# Patient Record
Sex: Female | Born: 1965
Health system: Southern US, Community
[De-identification: ages and names within clinical notes are randomized; demographics above are authoritative.]

## PROBLEM LIST (undated history)

## (undated) DIAGNOSIS — I82409 Acute embolism and thrombosis of unspecified deep veins of unspecified lower extremity: Secondary | ICD-10-CM

## (undated) DIAGNOSIS — D219 Benign neoplasm of connective and other soft tissue, unspecified: Secondary | ICD-10-CM

## (undated) DIAGNOSIS — I10 Essential (primary) hypertension: Secondary | ICD-10-CM

## (undated) HISTORY — PX: TOE SURGERY: SHX1073

## (undated) HISTORY — DX: Essential (primary) hypertension: I10

## (undated) HISTORY — PX: ANKLE SURGERY: SHX546

---

## 1991-09-13 HISTORY — PX: TUBAL LIGATION: SHX77

## 2019-09-02 ENCOUNTER — Other Ambulatory Visit: Payer: Self-pay

## 2019-09-02 ENCOUNTER — Encounter (HOSPITAL_COMMUNITY): Payer: Self-pay | Admitting: Emergency Medicine

## 2019-09-02 ENCOUNTER — Ambulatory Visit (HOSPITAL_COMMUNITY)
Admission: EM | Admit: 2019-09-02 | Discharge: 2019-09-02 | Disposition: A | Discharge status: Home / Self Care | Payer: Medicaid Other | Attending: Family Medicine | Admitting: Family Medicine

## 2019-09-02 ENCOUNTER — Ambulatory Visit (INDEPENDENT_AMBULATORY_CARE_PROVIDER_SITE_OTHER): Payer: Medicaid Other

## 2019-09-02 DIAGNOSIS — R079 Chest pain, unspecified: Secondary | ICD-10-CM

## 2019-09-02 MED ORDER — PREDNISONE 5 MG PO TABS: ORAL_TABLET | ORAL | 0 refills | Status: DC

## 2019-09-02 NOTE — ED Provider Notes (Signed)
[place]    CSN: [id number] Arrival date & time: 09/02/19  1906      History   Chief Complaint Chief Complaint  Patient presents with  . Back Pain    HPI Carolyn Mcdonald is a 53 y.o. female. she is presenting with a 2 day history of right sided chest pain. Pain has been sharp and constant. No associated diaphoresis or shortness of breath. Feel sharp pain with breathing. No history of similar symptoms. No improvement with modalities to date. Usually has normal blood pressure. Pain is moderate to severe.   HPI  History reviewed. No pertinent past medical history.  There are no problems to display for this patient.   History reviewed. No pertinent surgical history.  OB History   No obstetric history on file.      Home Medications    Prior to Admission medications   Medication Sig Start Date End Date Taking? Authorizing Provider  predniSONE (DELTASONE) 5 MG tablet Take 6 pills for first day, 5 pills second day, 4 pills third day, 3 pills fourth day, 2 pills the fifth day, and 1 pill sixth day. 09/02/19   Rosemarie Ax, MD    Family History History reviewed. No pertinent family history.  Social History Social History   Tobacco Use  . Smoking status: Never Smoker  Substance Use Topics  . Alcohol use: Never  . Drug use: Never     Allergies   Patient has no known allergies.   Review of Systems Review of Systems  Constitutional: Negative for fever.  HENT: Negative for congestion.   Respiratory: Negative for cough.   Cardiovascular: Positive for chest pain.  Gastrointestinal: Negative for abdominal pain.  Musculoskeletal: Positive for back pain.  Skin: Negative for color change.  Neurological: Negative for weakness.  Hematological: Negative for adenopathy.     Physical Exam Triage Vital Signs ED Triage Vitals  Enc Vitals Group     BP 09/02/19 2005 (!) 191/117     Pulse Rate 09/02/19 2005 (!) 105     Resp 09/02/19 2005 18   Temp 09/02/19 2005 97.9 F (36.6 C)     Temp Source 09/02/19 2005 Oral     SpO2 09/02/19 2005 100 %     Weight --      Height --      Head Circumference --      Peak Flow --      Pain Score 09/02/19 2000 8     Pain Loc --      Pain Edu? --      Excl. in [place]? --    No data found.  Updated Vital Signs BP (!) 177/100 (BP Location: Left Arm) Comment: regular cuff on left forearm, dr Rikki Smestad at bedside  Pulse 94   Temp 97.9 F (36.6 C) (Oral)   Resp (!) 24   LMP 08/28/2019   SpO2 99%   Visual Acuity Right Eye Distance:   Left Eye Distance:   Bilateral Distance:    Right Eye Near:   Left Eye Near:    Bilateral Near:     Physical Exam Gen: NAD, alert, cooperative with exam, well-appearing ENT: normal lips, normal nasal mucosa,  Eye: normal EOM, normal conjunctiva and lids CV:  no edema, +2 pedal pulses   Resp: no accessory muscle use, non-labored,  Skin: no rashes, no areas of induration  Neuro: normal tone, normal sensation to touch Psych:  normal insight, alert and oriented MSK: normal gait,normal strength  UC Treatments / Results  Labs (all labs ordered are listed, but only abnormal results are displayed) Labs Reviewed - No data to display  EKG  Ekg interpretation: NSR     Radiology DG Chest 2 View  Result Date: 09/02/2019 CLINICAL DATA:  Right chest and back pain for the past 2 days. No known injury. EXAM: CHEST - 2 VIEW COMPARISON:  None. FINDINGS: Normal sized heart. Tortuous aorta. Clear lungs. Thoracic spine degenerative changes. IMPRESSION: No acute abnormality. Electronically Signed   By: Claudie Revering M.D.   On: 09/02/2019 20:38    Procedures Procedures (including critical care time)  Medications Ordered in UC Medications - No data to display  Initial Impression / Assessment and Plan / UC Course  I have reviewed the triage vital signs and the nursing notes.  Pertinent labs & imaging results that were available during my care of the patient were  reviewed by me and considered in my medical decision making (see chart for details).     [name] is a 53 yo F that is presenting with atypical chest pain. EKG was reassuring. Chest xray not demonstrating source of pain.  Possible for costochondritis versus pleuritic type chest pain.  Seems less likely to be cardiac in origin.  May be associated with stress or anxiety.  No history of cardiac disease or pulmonary disease.  Provided prednisone.  Counseled to seek immediate care if symptoms worsen.  Given indications to follow-up.   Final Clinical Impressions(s) / UC Diagnoses   Final diagnoses:  Chest pain, unspecified type     Discharge Instructions     Please try the prednisone  Please seek immediate care if your symptoms worsen  Please follow up if your symptoms fail to improve.     ED Prescriptions    Medication Sig Dispense Auth. Provider   predniSONE (DELTASONE) 5 MG tablet Take 6 pills for first day, 5 pills second day, 4 pills third day, 3 pills fourth day, 2 pills the fifth day, and 1 pill sixth day. 21 tablet Rosemarie Ax, MD     PDMP not reviewed this encounter.   Rosemarie Ax, MD 09/02/19 2109

## 2019-09-02 NOTE — Discharge Instructions (Addendum)
Please try the prednisone  Please seek immediate care if your symptoms worsen  Please follow up if your symptoms fail to improve.

## 2019-09-02 NOTE — ED Triage Notes (Signed)
Right mid-back pain that started 2 days ago.  Pain to take a deep breath.    Sinus headache a few days ago.

## 2019-09-24 ENCOUNTER — Other Ambulatory Visit: Payer: Self-pay

## 2019-09-24 ENCOUNTER — Ambulatory Visit (HOSPITAL_COMMUNITY)
Admission: EM | Admit: 2019-09-24 | Discharge: 2019-09-24 | Disposition: A | Discharge status: Home / Self Care | Payer: Medicaid Other | Attending: Urgent Care | Admitting: Urgent Care

## 2019-09-24 ENCOUNTER — Encounter (HOSPITAL_COMMUNITY): Payer: Self-pay

## 2019-09-24 ENCOUNTER — Ambulatory Visit (INDEPENDENT_AMBULATORY_CARE_PROVIDER_SITE_OTHER): Payer: Medicaid Other

## 2019-09-24 DIAGNOSIS — I160 Hypertensive urgency: Secondary | ICD-10-CM

## 2019-09-24 DIAGNOSIS — M25562 Pain in left knee: Secondary | ICD-10-CM

## 2019-09-24 DIAGNOSIS — R030 Elevated blood-pressure reading, without diagnosis of hypertension: Secondary | ICD-10-CM

## 2019-09-24 DIAGNOSIS — S8002XA Contusion of left knee, initial encounter: Secondary | ICD-10-CM

## 2019-09-24 DIAGNOSIS — S8012XA Contusion of left lower leg, initial encounter: Secondary | ICD-10-CM

## 2019-09-24 DIAGNOSIS — I10 Essential (primary) hypertension: Secondary | ICD-10-CM

## 2019-09-24 DIAGNOSIS — W19XXXA Unspecified fall, initial encounter: Secondary | ICD-10-CM

## 2019-09-24 MED ORDER — AMLODIPINE BESYLATE 5 MG PO TABS: 5.0000 mg | ORAL_TABLET | Freq: Every day | ORAL | 0 refills | Status: DC

## 2019-09-24 MED ORDER — PREDNISONE 20 MG PO TABS: 20.0000 mg | ORAL_TABLET | Freq: Every day | ORAL | 0 refills | Status: DC

## 2019-09-24 NOTE — ED Provider Notes (Signed)
[place]   MRN: [id number] DOB: [birth year]  Subjective:   Carolyn Mcdonald is a 54 y.o. female presenting for suffering a fall 1 week ago.  Patient accidentally tripped on a step heading into a door.  She landed on her left knee and has since had persistent left knee pain and swelling, bruising as well.  She states that it is difficult for her to continue to walk on her feet.  Regarding her high blood pressure, states that she has not been diagnosed with hypertension before.  She states that she has previously checked her pressure at home and has been normal.  Patient's family history significant for hypertension and heart failure with her mother.  She is not currently taking any medications and has no known food or drug allergies.  Denies past medical and surgical history.  Social History   Tobacco Use  . Smoking status: Never Smoker  . Smokeless tobacco: Never Used  Substance Use Topics  . Alcohol use: Never  . Drug use: Never    Review of Systems  Constitutional: Negative for fever and malaise/fatigue.  HENT: Negative for congestion, ear pain, sinus pain and sore throat.   Eyes: Negative for blurred vision, double vision, discharge and redness.  Respiratory: Negative for cough, hemoptysis, shortness of breath and wheezing.   Cardiovascular: Negative for chest pain.  Gastrointestinal: Negative for abdominal pain, diarrhea, nausea and vomiting.  Genitourinary: Negative for dysuria, flank pain and hematuria.  Musculoskeletal: Positive for joint pain. Negative for myalgias.  Skin: Negative for rash.  Neurological: Negative for dizziness, weakness and headaches.  Psychiatric/Behavioral: Negative for depression and substance abuse.     Objective:   Vitals: BP (!) 194/119 (BP Location: Right Arm)   Pulse 85   Temp 98.5 F (36.9 C) (Oral)   Resp 18   Wt 280 lb (127 kg)   LMP 08/28/2019   SpO2 100%   BP Readings from Last 3 Encounters:  09/24/19 (!) 194/119    09/02/19 (!) 177/100   BP recheck is 196/142 by PA-Zaylin Runco.   Physical Exam Constitutional:      General: She is not in acute distress.    Appearance: Normal appearance. She is well-developed. She is obese. She is not ill-appearing, toxic-appearing or diaphoretic.  HENT:     Head: Normocephalic and atraumatic.     Nose: Nose normal.     Mouth/Throat:     Mouth: Mucous membranes are moist.  Eyes:     General: No scleral icterus.    Extraocular Movements: Extraocular movements intact.     Pupils: Pupils are equal, round, and reactive to light.  Cardiovascular:     Rate and Rhythm: Normal rate and regular rhythm.     Pulses: Normal pulses.     Heart sounds: Normal heart sounds. No murmur. No friction rub. No gallop.   Pulmonary:     Effort: Pulmonary effort is normal. No respiratory distress.     Breath sounds: Normal breath sounds. No stridor. No wheezing, rhonchi or rales.  Musculoskeletal:     Right lower leg: No edema.     Left lower leg: No edema.       Legs:  Skin:    General: Skin is warm and dry.     Findings: No rash.  Neurological:     Mental Status: She is alert and oriented to person, place, and time.     Cranial Nerves: No cranial nerve deficit.     Motor: No weakness.  Coordination: Coordination normal.     Gait: Gait abnormal (limping from left knee pain).     Deep Tendon Reflexes: Reflexes normal.  Psychiatric:        Mood and Affect: Mood normal.        Behavior: Behavior normal.        Thought Content: Thought content normal.        Judgment: Judgment normal.     DG Knee Complete 4 Views Left  Result Date: 09/24/2019 CLINICAL DATA:  54 year old female with fall and left knee pain. EXAM: LEFT KNEE - COMPLETE 4+ VIEW COMPARISON:  None. FINDINGS: There is no acute fracture or dislocation. There is moderate arthritic changes of the left knee with narrowing of the medial compartment and bone spurring. Probable trace suprapatellar effusion. There is mild  diffuse subcutaneous edema. IMPRESSION: 1. No acute fracture or dislocation. 2. Osteoarthritic changes. Electronically Signed   By: Anner Crete M.D.   On: 09/24/2019 20:42    Assessment and Plan :   1. Fall, initial encounter   2. Acute pain of left knee   3. Contusion of left knee and lower leg, initial encounter   4. Hypertensive urgency   5. Essential hypertension   6. Elevated blood pressure reading     Start amlodipine 5mg  once daily. Emphasized need for dietary modifications. Needs to establish care with PCP for further bp management. Strict ER precautions for HTN emergency. Will manage her knee pain for contusion with prednisone given that patient is not a good candidate for NSAIDs. Counseled patient on potential for adverse effects with medications prescribed/recommended today, ER and return-to-clinic precautions discussed, patient verbalized understanding.    Jaynee Eagles, [place] 09/25/19 [phone]

## 2019-09-24 NOTE — ED Notes (Signed)
194/119 reported to [employer]

## 2019-09-24 NOTE — ED Triage Notes (Signed)
Pt states she fell a week ago . Pt states she still has left knee pain.

## 2019-09-24 NOTE — Discharge Instructions (Addendum)
For diabetes, please make sure you are avoiding starchy, carbohydrate foods like pasta, breads, pastry, rice, potatoes, desserts. These foods can elevated your blood sugar. Also, limit your alcohol drinking to 1 per day, avoid sodas, sweet teas. For elevated blood pressure, make sure you are monitoring salt in your diet.  Do not eat restaurant foods and limit processed foods at home.  Processed foods include things like frozen meals preseason meats and dinners.  Make sure your pain attention to sodium labels on foods you by at the grocery store.  For seasoning you can use a brand called Mrs. Dash which includes a lot of salt free seasonings.  Salads - kale, spinach, cabbage, spring mix; use seeds like pumpkin seeds or sunflower seeds, almonds; you can also use 1-2 hard boiled eggs in your salads Fruits - avocadoes, berries (blueberries, raspberries, blackberries), apples, oranges Vegetables - aspargus, cauliflower, broccoli, green beans, brussel spouts, bell peppers; stay away from starchy vegetables like potatoes, carrots, peas  Regarding meat it is better to eat lean meats and limit your red meat consumption including pork.  Wild caught fish, chicken breast are good options.  Do not eat any foods on this list that you are allergic to.

## 2019-11-15 ENCOUNTER — Other Ambulatory Visit: Payer: Self-pay

## 2019-11-15 ENCOUNTER — Encounter (HOSPITAL_COMMUNITY): Payer: Self-pay | Admitting: Emergency Medicine

## 2019-11-15 ENCOUNTER — Emergency Department (HOSPITAL_COMMUNITY)
Admission: EM | Admit: 2019-11-15 | Discharge: 2019-11-15 | Disposition: A | Discharge status: Home / Self Care | Payer: Medicaid Other | Attending: Emergency Medicine | Admitting: Emergency Medicine

## 2019-11-15 DIAGNOSIS — R102 Pelvic and perineal pain: Secondary | ICD-10-CM | POA: Diagnosis not present

## 2019-11-15 DIAGNOSIS — N939 Abnormal uterine and vaginal bleeding, unspecified: Secondary | ICD-10-CM | POA: Insufficient documentation

## 2019-11-15 LAB — CBC
HCT: 39.3 % (ref 36.0–46.0)
Hemoglobin: 12.6 g/dL (ref 12.0–15.0)
MCH: 29 pg (ref 26.0–34.0)
MCHC: 32.1 g/dL (ref 30.0–36.0)
MCV: 90.3 fL (ref 80.0–100.0)
Platelets: 248 10*3/uL (ref 150–400)
RBC: 4.35 MIL/uL (ref 3.87–5.11)
RDW: 12.7 % (ref 11.5–15.5)
WBC: 5.5 10*3/uL (ref 4.0–10.5)
nRBC: 0 % (ref 0.0–0.2)

## 2019-11-15 LAB — I-STAT BETA HCG BLOOD, ED (MC, WL, AP ONLY): I-stat hCG, quantitative: <5 m[IU]/mL (ref <5)

## 2019-11-15 MED ORDER — ACETAMINOPHEN 500 MG PO TABS
1000.0000 mg | ORAL_TABLET | Freq: Once | ORAL | Status: AC
  Administered 2019-11-15: 10:00:00 1000 mg via ORAL
  Filled 2019-11-15: qty 2

## 2019-11-15 MED ORDER — IBUPROFEN 400 MG PO TABS
600.0000 mg | ORAL_TABLET | Freq: Once | ORAL | Status: DC
  Filled 2019-11-15: qty 1

## 2019-11-15 MED ORDER — MEGESTROL ACETATE 40 MG PO TABS: 40.0000 mg | ORAL_TABLET | Freq: Two times a day (BID) | ORAL | 0 refills | Status: DC

## 2019-11-15 MED FILL — MEGESTROL 40 MG TABLET: 40 | 15 days supply | Qty: 30 | Fill #0

## 2019-11-15 NOTE — ED Notes (Signed)
Pt discharge instructions and prescription reviewed with the patient. The patient verbalized understanding of both. Pt discharged. 

## 2019-11-15 NOTE — ED Triage Notes (Signed)
Pt in with heavy vaginal bleeding, worse when she woke this am. States she sat up, felt gush of bloodflow, and the same happened when she went to use the bathroom. She is on 2nd day of period. LMP prior to this 1/19. Reporting some dizziness when standing, VSS

## 2019-11-15 NOTE — ED Provider Notes (Signed)
[facility] EMERGENCY DEPARTMENT Provider Note   CSN: [id number] Arrival date & time: 11/15/19  0557     History Chief Complaint  Patient presents with  . Vaginal Bleeding  . Abdominal Cramping    Carolyn Mcdonald is a 54 y.o. female.  54 year old female with history of hypertension and obesity who presents with heavy vaginal bleeding and pelvic pain.  Patient states that she began her menstrual period 2 days ago.  Early this morning, she woke up and was having heavy vaginal bleeding.  She stood up and felt a gush of blood which happened again when she went to the bathroom.  She states that it is different from her usual period because it was "like someone had cut me with a knife and let the blood flow." She reports associated lower abdominal pain and low back pain.  She denies any associated vomiting, diarrhea, fevers, urinary symptoms, or recent illness.  No vaginal discharge prior to onset of symptoms.  She has not seen an OB/GYN since 2019.  No anticoagulant use or history of bleeding problems.  Incidental finding of hypertension at triage.  Patient states that she was noted to be hypertensive at an urgent care visit in January and started on amlodipine.  She has been taking this medication but notes that her blood pressure is still elevated sometimes at home.  She has relocated from [place] and has not yet found a PCP.  The history is provided by the patient.  Vaginal Bleeding Abdominal Cramping       History reviewed. No pertinent past medical history.  There are no problems to display for this patient.   History reviewed. No pertinent surgical history.   OB History   No obstetric history on file.     No family history on file.  Social History   Tobacco Use  . Smoking status: Never Smoker  . Smokeless tobacco: Never Used  Substance Use Topics  . Alcohol use: Never  . Drug use: Never    Home Medications Prior to Admission medications     Medication Sig Start Date End Date Taking? Authorizing Provider  amLODipine (NORVASC) 5 MG tablet Take 1 tablet (5 mg total) by mouth daily. 09/24/19   Jaynee Eagles, PA-C  megestrol (MEGACE) 40 MG tablet Take 1 tablet (40 mg total) by mouth 2 (two) times daily. Take twice daily until bleeding stops - consult with your Gynecologist before starting this medicine 11/15/19   Nilani Hugill, Wenda Overland, MD  predniSONE (DELTASONE) 20 MG tablet Take 1 tablet (20 mg total) by mouth daily with breakfast. 09/24/19   Jaynee Eagles, PA-C    Allergies    Patient has no known allergies.  Review of Systems   Review of Systems  Genitourinary: Positive for vaginal bleeding.   All other systems reviewed and are negative except that which was mentioned in HPI  Physical Exam Updated Vital Signs BP (!) 165/115 (BP Location: Left Arm)   Pulse (!) 102   Temp 98.3 F (36.8 C) (Oral)   Resp 18   Wt 127 kg   SpO2 100%   Physical Exam Vitals and nursing note reviewed.  Constitutional:      General: She is not in acute distress.    Appearance: She is well-developed.  HENT:     Head: Normocephalic and atraumatic.  Eyes:     Conjunctiva/sclera: Conjunctivae normal.  Cardiovascular:     Rate and Rhythm: Normal rate and regular rhythm.     Heart  sounds: Normal heart sounds. No murmur.  Pulmonary:     Effort: Pulmonary effort is normal.     Breath sounds: Normal breath sounds.  Abdominal:     General: Bowel sounds are normal. There is no distension.     Palpations: Abdomen is soft.     Tenderness: There is no abdominal tenderness.  Musculoskeletal:     Cervical back: Neck supple.     Right lower leg: No edema.     Left lower leg: No edema.  Skin:    General: Skin is warm and dry.  Neurological:     Mental Status: She is alert and oriented to person, place, and time.     Comments: Fluent speech Normal gait  Psychiatric:        Judgment: Judgment normal.     ED Results / Procedures / Treatments    Labs (all labs ordered are listed, but only abnormal results are displayed) Labs Reviewed  CBC  I-STAT BETA HCG BLOOD, ED ([facility], [facility], [facility] ONLY)    EKG None  Radiology No results found.  Procedures Procedures (including critical care time)  Medications Ordered in ED Medications  acetaminophen (TYLENOL) tablet 1,000 mg (has no administration in time range)    ED Course  I have reviewed the triage vital signs and the nursing notes.  Pertinent labs that were available during my care of the patient were reviewed by me and considered in my medical decision making (see chart for details).    MDM Rules/Calculators/A&P                      Patient was well-appearing on exam, she had no focal abdominal tenderness.  Given that abdominal pain has been directly correlated with her increase in vaginal bleeding, I suspect they are related.  She has had no other symptoms suggestive of appendicitis, PID, pyelonephritis, or other infectious process.  CBC is normal.  Pregnancy test negative.  I explained that ultimately she needs an evaluation from an OB/GYN to determine the cause of her vaginal bleeding and she may require further testing such as endometrial biopsy or ultrasound.  Because her pregnancy test is negative here, I do not feel she needs ultrasound emergently.  She will also need pelvic exam at OB/GYN office, will defer for now as it would be unlikely to change management plan of starting her on megace while she awaits clinic appt. Provided w/ Women's clinic info and emphasized importance of scheduling appointment ASAP.  Regarding her blood pressure, I instructed her to continue amlodipine for now but it is very important for her to establish care with a PCP to further manage this problem.  No signs of hypertensive urgency/emergency today.  I have reviewed return precautions and she voiced understanding. Final Clinical Impression(s) / ED Diagnoses Final diagnoses:  Abnormal vaginal bleeding   Pelvic pain in female    Rx / DC Orders ED Discharge Orders         Ordered    megestrol (MEGACE) 40 MG tablet  2 times daily     11/15/19 0953           Charitie Hinote, Wenda Overland, MD 11/15/19 1001

## 2019-12-12 ENCOUNTER — Other Ambulatory Visit (HOSPITAL_COMMUNITY)
Admission: RE | Admit: 2019-12-12 | Discharge: 2019-12-12 | Disposition: A | Discharge status: Home / Self Care | Payer: Medicaid Other | Source: Ambulatory Visit | Attending: Obstetrics and Gynecology | Admitting: Obstetrics and Gynecology

## 2019-12-12 ENCOUNTER — Other Ambulatory Visit: Payer: Self-pay

## 2019-12-12 ENCOUNTER — Encounter: Payer: Self-pay | Admitting: Obstetrics and Gynecology

## 2019-12-12 ENCOUNTER — Ambulatory Visit: Payer: Medicaid Other | Admitting: Obstetrics and Gynecology

## 2019-12-12 VITALS — BP 161/106 | HR 92 | Ht 63.0 in | Wt 315.0 lb

## 2019-12-12 DIAGNOSIS — Z124 Encounter for screening for malignant neoplasm of cervix: Secondary | ICD-10-CM

## 2019-12-12 DIAGNOSIS — N939 Abnormal uterine and vaginal bleeding, unspecified: Secondary | ICD-10-CM | POA: Insufficient documentation

## 2019-12-12 DIAGNOSIS — I10 Essential (primary) hypertension: Secondary | ICD-10-CM

## 2019-12-12 DIAGNOSIS — E669 Obesity, unspecified: Secondary | ICD-10-CM | POA: Diagnosis not present

## 2019-12-12 DIAGNOSIS — Z1231 Encounter for screening mammogram for malignant neoplasm of breast: Secondary | ICD-10-CM

## 2019-12-12 DIAGNOSIS — Z6841 Body Mass Index (BMI) 40.0 and over, adult: Secondary | ICD-10-CM

## 2019-12-12 MED ORDER — MEGESTROL ACETATE 40 MG PO TABS: 40.0000 mg | ORAL_TABLET | Freq: Two times a day (BID) | ORAL | 5 refills | Status: DC

## 2019-12-12 NOTE — Progress Notes (Signed)
54 yo P3 with BMI 55 here for the evaluation of AUB. Patient reports a history of a 5-7 day period monthly. She admits to skipping 2 periods per year over the past few years. She states that she did not have a period in February but has been bleeding intermittently throughout March. She describes her bleeding as heavy at times. She went to the ED in early March for that reason. She was prescribed megace which decreased the flow, Patient also reports some dysmenorrhea. She was told several years ago that she had fibroids. Patient is otherwise without complaints. She is looking for a PCP to help with her hypertension. Patient is sexually active using BTL for contraception. She reports some occasional vasomotor symptoms  Past Medical History:  Diagnosis Date  . Hypertension    Past Surgical History:  Procedure Laterality Date  . ANKLE SURGERY     R ankle   . CESAREAN SECTION     x3  . TOE SURGERY     R Great toe   . TUBAL LIGATION  1993   No family history on file. Social History   Tobacco Use  . Smoking status: Never Smoker  . Smokeless tobacco: Never Used  Substance Use Topics  . Alcohol use: Never  . Drug use: Never   ROS See pertinent in HPI  Blood pressure (!) 161/106, pulse 92, height 5\' 3"  (1.6 m), weight (!) 315 lb (142.9 kg), last menstrual period 12/05/2019. GENERAL: Well-developed, well-nourished female in no acute distress.  HEENT: Normocephalic, atraumatic. Sclerae anicteric.  NECK: Supple. Normal thyroid.  LUNGS: Clear to auscultation bilaterally.  HEART: Regular rate and rhythm. BREASTS: Symmetric in size. No palpable masses or lymphadenopathy, skin changes, or nipple drainage. ABDOMEN: Soft, nontender, nondistended.  PELVIC: Normal external female genitalia. Vagina is pink and rugated.  Normal discharge. Normal appearing cervix. Bimanual exam limited by body habitus.  EXTREMITIES: No cyanosis, clubbing, or edema, 2+ distal pulses.  A/P 54 yo with AUB - pelvic  ultrasound ordered - pap smear collected - Screening mammogram ordered - Patient referred to nutritionist for weight lost - Referral to family practise - Rx megace provided - Discussed benefits of endometrial biopsy and patient agreed ENDOMETRIAL BIOPSY     The indications for endometrial biopsy were reviewed.   Risks of the biopsy including cramping, bleeding, infection, uterine perforation, inadequate specimen and need for additional procedures  were discussed. The patient states she understands and agrees to undergo procedure today. Consent was signed. Time out was performed. Urine HCG was negative. A sterile speculum was placed in the patient's vagina and the cervix was prepped with Betadine. A single-toothed tenaculum was placed on the anterior lip of the cervix to stabilize it. The uterine cavity was sounded to a depth of 14 cm using the uterine sound. The 3 mm pipelle was introduced into the endometrial cavity without difficulty, 2 passes were made.  A  moderate amount of tissue was  sent to pathology. The instruments were removed from the patient's vagina. Minimal bleeding from the cervix was noted. The patient tolerated the procedure well.  Routine post-procedure instructions were given to the patient. The patient will follow up in two weeks to review the results and for further management.

## 2019-12-12 NOTE — Progress Notes (Signed)
NGYN c/o no menses Feb.  Had multiple cycles in March. Still bleeding today Normal pap 2017 per pt  Elevated BP - Took BP meds 45 mins ago

## 2019-12-13 LAB — CYTOLOGY - PAP
Adequacy: ABSENT
Comment: NEGATIVE
Diagnosis: NEGATIVE
High risk HPV: NEGATIVE

## 2019-12-13 LAB — SURGICAL PATHOLOGY

## 2019-12-19 ENCOUNTER — Other Ambulatory Visit: Payer: Self-pay

## 2019-12-19 ENCOUNTER — Ambulatory Visit (HOSPITAL_COMMUNITY)
Admission: RE | Admit: 2019-12-19 | Discharge: 2019-12-19 | Disposition: A | Discharge status: Home / Self Care | Payer: Medicaid Other | Source: Ambulatory Visit | Attending: Obstetrics and Gynecology | Admitting: Obstetrics and Gynecology

## 2019-12-19 DIAGNOSIS — N939 Abnormal uterine and vaginal bleeding, unspecified: Secondary | ICD-10-CM | POA: Insufficient documentation

## 2019-12-23 ENCOUNTER — Telehealth: Payer: Self-pay

## 2019-12-23 ENCOUNTER — Other Ambulatory Visit: Payer: Self-pay | Admitting: Obstetrics and Gynecology

## 2019-12-23 NOTE — Telephone Encounter (Signed)
Patient called in stating that she started Megace on Fri or Sat, and bleeding has been worse. She states that she is taking 2 pills twice a day, advised of provider recommendations and u/s results. Messaged provider to inform of pt concerns.

## 2019-12-23 NOTE — Telephone Encounter (Signed)
Returned call and advised of provider recommendations to continue medication and return to office if no improvement to discuss possible surgery options.

## 2019-12-30 ENCOUNTER — Ambulatory Visit: Payer: Medicaid Other

## 2020-01-08 ENCOUNTER — Ambulatory Visit: Payer: Medicaid Other | Admitting: Obstetrics and Gynecology

## 2020-01-08 ENCOUNTER — Encounter: Payer: Self-pay | Admitting: Obstetrics and Gynecology

## 2020-01-08 ENCOUNTER — Other Ambulatory Visit: Payer: Self-pay

## 2020-01-08 VITALS — BP 158/105 | HR 109 | Wt 314.2 lb

## 2020-01-08 DIAGNOSIS — D250 Submucous leiomyoma of uterus: Secondary | ICD-10-CM | POA: Diagnosis not present

## 2020-01-08 DIAGNOSIS — N939 Abnormal uterine and vaginal bleeding, unspecified: Secondary | ICD-10-CM

## 2020-01-08 DIAGNOSIS — I10 Essential (primary) hypertension: Secondary | ICD-10-CM

## 2020-01-08 DIAGNOSIS — D251 Intramural leiomyoma of uterus: Secondary | ICD-10-CM

## 2020-01-08 DIAGNOSIS — Z30013 Encounter for initial prescription of injectable contraceptive: Secondary | ICD-10-CM

## 2020-01-08 MED ORDER — MEDROXYPROGESTERONE ACETATE 150 MG/ML IM SUSP
150.0000 mg | Freq: Once | INTRAMUSCULAR | Status: AC
  Administered 2020-01-08: 150 mg via INTRAMUSCULAR

## 2020-01-08 NOTE — Progress Notes (Signed)
Pt is here to discuss further options for AUB. She is currently prescribed to take Megace, she reports her bleeding is not any better.

## 2020-01-08 NOTE — Patient Instructions (Signed)
Endometrial Ablation Endometrial ablation is a procedure that destroys the thin inner layer of the lining of the uterus (endometrium). This procedure may be done:  To stop heavy periods.  To stop bleeding that is causing anemia.  To control irregular bleeding.  To treat bleeding caused by small tumors (fibroids) in the endometrium. This procedure is often an alternative to major surgery, such as removal of the uterus and cervix (hysterectomy). As a result of this procedure:  You may not be able to have children. However, if you are premenopausal (you have not gone through menopause): ? You may still have a small chance of getting pregnant. ? You will need to use a reliable method of birth control after the procedure to prevent pregnancy.  You may stop having a menstrual period, or you may have only a small amount of bleeding during your period. Menstruation may return several years after the procedure. Tell a health care provider about:  Any allergies you have.  All medicines you are taking, including vitamins, herbs, eye drops, creams, and over-the-counter medicines.  Any problems you or family members have had with the use of anesthetic medicines.  Any blood disorders you have.  Any surgeries you have had.  Any medical conditions you have. What are the risks? Generally, this is a safe procedure. However, problems may occur, including:  A hole (perforation) in the uterus or bowel.  Infection of the uterus, bladder, or vagina.  Bleeding.  Damage to other structures or organs.  An air bubble in the lung (air embolus).  Problems with pregnancy after the procedure.  Failure of the procedure.  Decreased ability to diagnose cancer in the endometrium. What happens before the procedure?  You will have tests of your endometrium to make sure there are no pre-cancerous cells or cancer cells present.  You may have an ultrasound of the uterus.  You may be given medicines to  thin the endometrium.  Ask your health care provider about: ? Changing or stopping your regular medicines. This is especially important if you take diabetes medicines or blood thinners. ? Taking medicines such as aspirin and ibuprofen. These medicines can thin your blood. Do not take these medicines before your procedure if your doctor tells you not to.  Plan to have someone take you home from the hospital or clinic. What happens during the procedure?   You will lie on an exam table with your feet and legs supported as in a pelvic exam.  To lower your risk of infection: ? Your health care team will wash or sanitize their hands and put on germ-free (sterile) gloves. ? Your genital area will be washed with soap.  An IV tube will be inserted into one of your veins.  You will be given a medicine to help you relax (sedative).  A surgical instrument with a light and camera (resectoscope) will be inserted into your vagina and moved into your uterus. This allows your surgeon to see inside your uterus.  Endometrial tissue will be removed using one of the following methods: ? Radiofrequency. This method uses a radiofrequency-alternating electric current to remove the endometrium. ? Cryotherapy. This method uses extreme cold to freeze the endometrium. ? Heated-free liquid. This method uses a heated saltwater (saline) solution to remove the endometrium. ? Microwave. This method uses high-energy microwaves to heat up the endometrium and remove it. ? Thermal balloon. This method involves inserting a catheter with a balloon tip into the uterus. The balloon tip is filled with   heated fluid to remove the endometrium. The procedure may vary among health care providers and hospitals. What happens after the procedure?  Your blood pressure, heart rate, breathing rate, and blood oxygen level will be monitored until the medicines you were given have worn off.  As tissue healing occurs, you may notice  vaginal bleeding for 4-6 weeks after the procedure. You may also experience: ? Cramps. ? Thin, watery vaginal discharge that is light pink or brown in color. ? A need to urinate more frequently than usual. ? Nausea.  Do not drive for 24 hours if you were given a sedative.  Do not have sex or insert anything into your vagina until your health care provider approves. Summary  Endometrial ablation is done to treat the many causes of heavy menstrual bleeding.  The procedure may be done only after medications have been tried to control the bleeding.  Plan to have someone take you home from the hospital or clinic. This information is not intended to replace advice given to you by your health care provider. Make sure you discuss any questions you have with your health care provider. Document Revised: 02/13/2018 Document Reviewed: 09/15/2016 Elsevier Patient Education  2020 Elsevier Inc.    Hysterectomy Information  A hysterectomy is a surgery in which the uterus is removed. The fallopian tubes and ovaries may be removed (bilateral salpingo-oophorectomy) as well. This procedure may be done to treat various medical problems. After the procedure, a woman will no longer have menstrual periods nor will she be able to become pregnant (sterile). What are the reasons for a hysterectomy? There are many reasons why a woman might have this procedure. They include:  Persistent, abnormal vaginal bleeding.  Long-term (chronic) pelvic pain or infection.  Endometriosis. This is when the lining of the uterus (endometrium) starts to grow outside the uterus.  Adenomyosis. This is when the endometrium starts to grow in the muscle of the uterus.  Pelvic organ prolapse. This is a condition in which the uterus falls down into the vagina.  Noncancerous growths in the uterus (uterine fibroids) that cause symptoms.  The presence of precancerous cells.  Cervical or uterine cancer. What are the different  types of hysterectomy? There are three different types of hysterectomy:  Supracervical hysterectomy. In this type, the top part of the uterus is removed, but not the cervix.  Total hysterectomy. In this type, the uterus and cervix are removed.  Radical hysterectomy. In this type, the uterus, the cervix, and the tissue that holds the uterus in place (parametrium) are removed. What are the different ways a hysterectomy can be performed? There are many different ways a hysterectomy can be performed, including:  Abdominal hysterectomy. In this type, an incision is made in the abdomen. The uterus is removed through this incision.  Vaginal hysterectomy. In this type, an incision is made in the vagina. The uterus is removed through this incision. There are no abdominal incisions.  Conventional laparoscopic hysterectomy. In this type, three or four small incisions are made in the abdomen. A thin, lighted tube with a camera (laparoscope) is inserted into one of the incisions. Other tools are put through the other incisions. The uterus is cut into small pieces. The small pieces are removed through the incisions or through the vagina.  Laparoscopically assisted vaginal hysterectomy (LAVH). In this type, three or four small incisions are made in the abdomen. Part of the surgery is performed laparoscopically and the other part is done vaginally. The uterus is removed   through the vagina.  Robot-assisted laparoscopic hysterectomy. In this type, a laparoscope and other tools are inserted into three or four small incisions in the abdomen. A computer-controlled device is used to give the surgeon a 3D image and to help control the surgical instruments. This allows for more precise movements of surgical instruments. The uterus is cut into small pieces and removed through the incisions or removed through the vagina. Discuss the options with your health care provider to determine which type is the right one for  you. What are the risks? Generally, this is a safe procedure. However, problems may occur, including:  Bleeding and risk of blood transfusion. Tell your health care provider if you do not want to receive any blood products.  Blood clots in the legs or lung.  Infection.  Damage to other structures or organs.  Allergic reactions to medicines.  Changing to an abdominal hysterectomy from one of the other techniques. What to expect after a hysterectomy  You will be given pain medicine.  You may need to stay in the hospital for 1- 2 days to recover, depending on the type of hysterectomy you had.  Follow your health care provider's instructions about exercise, driving, and general activities. Ask your health care provider what activities are safe for you.  You will need to have someone with you for the first 3-5 days after you go home.  You will need to follow up with your surgeon in 2-4 weeks after surgery to evaluate your progress.  If the ovaries are removed, you will have early menopause symptoms such as hot flashes, night sweats, and insomnia.  If you had a hysterectomy for a problem that was not cancer or not a condition that could lead to cancer, then you no longer need Pap tests. However, even if you no longer need a Pap test, a regular pelvic exam is a good idea to make sure no other problems are developing. Questions to ask your health care provider  Is a hysterectomy medically necessary? Do I have other treatment options for my condition?  What are my options for hysterectomy procedure?  What organs and tissues need to be removed?  What are the risks?  What are the benefits?  How long will I need to stay in the hospital after the procedure?  How long will I need to recover at home?  What symptoms can I expect after the procedure? Summary  A hysterectomy is a surgery in which the uterus is removed. The fallopian tubes and ovaries may be removed (bilateral  salpingo-oophorectomy) as well.  This procedure may be done to treat various medical problems. After the procedure, a woman will no longer have menstrual periods nor will she be able to become pregnant.  Discuss the options with your health care provider to determine which type of hysterectomy is the right one for you. This information is not intended to replace advice given to you by your health care provider. Make sure you discuss any questions you have with your health care provider. Document Revised: 08/11/2017 Document Reviewed: 10/05/2016 Elsevier Patient Education  2020 [place] [phone]) . [place] o [place] [place], [place] [address] o [phone] o Mon-Fri 8:30-12:30, 1:30-5:00 o Accepting Medicaid . [place] at [facility] [place], [place], [place] [address] o [phone] o Mon-Fri 8:00-5:30 . [place] o [address] [place], [place] [address]  o [phone] o Mon, Tue, Thur, Fri 8:30-5:00, Wed 10:00-7:00 (closed 1-2pm) o Accepting Medicaid . [facility] o [id number] N. [address], [place], [place]  [address] o Phone - [phone]   Fax - [phone]  [place] [phone]) . [place]., [place], [place] [address] o [phone] o Mon-Fri 8:00-5:00 . [facility] at [facility])  o [place]., [place], [place] [address] o [phone] o Mon-Fri 8:30-5:30, Sat (Oct.-Mar.) 9:00-1:00 o Accepting [facility] [phone]) . [place] at [place], [place], [place] [address] o [phone] o Mon-Fri 8:00-5:00  [place] [phone]) . [place] at [place], [place], [place] [address] o [phone] o Mon-Fri  8:00-5:00 . Therapist, music at [place], [place], [place] [address] o [phone] o Mon-Fri 8:00-5:00 . Therapist, music at [place]., [place], [place] [address] o [phone] o Mon-Fri 8:00-5:00 . [place]., [place] [address] o [phone] o Mon-Fri 7:30-5:30  [place] ([place]) . [place]., [place], [place] [address] o [phone] o Mon-Thur 8:00-6:00 o Accepting Medicaid . [place]., [place], [place] [address] o [phone] o Mon-Thur 7:30-7:30, Fri 7:30-4:30 o Accepting Medicaid . [place] at [place] [address] [place], [place]  [address] o [phone]   Fax - [place] [phone] & [phone]) . Therapist, music at [place]., [place], [place] [address] o [phone] o Mon-Fri 7:00-5:00 . [place], [place], [place] [address] o [phone] o Mon-Fri 8:00-5:00 o Accepting Medicaid . [place], [place], [place] [address] o [phone] o Mon-Fri 8:00-5:00 o Accepting Medicaid  [facility] [place] [phone]) . [facility] o [place]., [place], [place] [address] o [phone] o Mon-Fri 8:00-5:00 . [place] ([place] at [employer]) o [address] [place], [place], [place] [address] o [phone] o Mon-Fri 8:00-5:00 o Accepting Medicaid . [place] ([facility] [employer]) o [address] [place], [place], [place] [address] o [phone] o Mon-Fri 8:00-5:30, Sat&Sun by appointment (phones open at 8:30) o Accepting  [facility] [phone] & [phone]) . [facility] o [address] [place], [place] [address] o [phone] o Mon-Thur 8:00-7:00, Fri 8:00-5:00, Sat 8:00-12:00, Sun 9:00-12:00 o Accepting Medicaid . [facility] at [facility] [address], [place], [place] [address] o [phone] o Mon-Thur 8:00-5:00 o Accepting Medicaid . [facility] at [place]., [place], [place] [address] o [phone] o Mon-Fri 8:00-5:30, Sat (Oct.-Mar.) 9:00-1:00 o Accepting [employer] [phone]) . [place] o [address] [place], [place], [place] [address] o [phone] o Mon-Fri 8:00-5:00 o Accepting Medicaid   [facility] [phone]) . [place] at [place], [place], [place] [address] o [phone] o Mon-Fri 8:00-5:00 . Therapist, music at [facility] o [address], [place], [place] [address] o (  [phone] o Mon-Fri 8:00-5:00 . [place] [address], [place], [place] [address] o [phone] o Mon-Fri 8:00-5:00 o After hours clinic [facility] [place], [place] [address]) [phone] Mon-Fri 5:00-8:00, Sat 12:00-6:00, Sun 10:00-4:00 o Accepting Medicaid . [place] at [facility] o [address] [place], [place]  [address] o [phone]   Fax - [phone]  [place] [phone]) . Therapist, music at [place] o [address] [place], [place] [address] o [phone] o Mon-Fri 8:00-5:00 . [facility] at [place]) o [place] [address], [place], [place] [address] o [phone] o Mon-Thur 8:00-7:00, Fri 8:00-5:00, Sat 8:00-12:00

## 2020-01-08 NOTE — Progress Notes (Signed)
54 yo here for follow up on AUB currently medically managed with Megace. Patient states megace worsened her bleeding despite taking 2 tablets twice daily. Patient is very frustrated and ready for the next step. She reports daily bleeding requiring a changing 3-4 pads per day. Patient is still researching a PCP for the management of her HTN  Past Medical History:  Diagnosis Date  . Hypertension    Past Surgical History:  Procedure Laterality Date  . ANKLE SURGERY     R ankle   . CESAREAN SECTION     x3  . TOE SURGERY     R Great toe   . TUBAL LIGATION  1993   No family history on file. Social History   Tobacco Use  . Smoking status: Never Smoker  . Smokeless tobacco: Never Used  Substance Use Topics  . Alcohol use: Never  . Drug use: Never   ROS See pertinent in HPI. All other systems reviewed and negative  Blood pressure (!) 158/105, pulse (!) 109, weight (!) 314 lb 3.2 oz (142.5 kg), last menstrual period 01/06/2020. GENERAL: Well-developed, well-nourished female in no acute distress.  ABDOMEN: Soft, nontender, nondistended. No organomegaly. PELVIC: Not performed EXTREMITIES: No cyanosis, clubbing, or edema, 2+ distal pulses.  US PELVIC COMPLETE WITH TRANSVAGINAL  Result Date: 12/19/2019 CLINICAL DATA:  Abnormal uterine bleeding, history of tubal ligation, Caesarean section x3, elevated BMI EXAM: TRANSABDOMINAL AND TRANSVAGINAL ULTRASOUND OF PELVIS TECHNIQUE: Both transabdominal and transvaginal ultrasound examinations of the pelvis were performed. Transabdominal technique was performed for global imaging of the pelvis including uterus, ovaries, adnexal regions, and pelvic cul-de-sac. It was necessary to proceed with endovaginal exam following the transabdominal exam to visualize the ovaries. COMPARISON:  None FINDINGS: Uterus Measurements: 13.5 x 6.4 x 6.5 cm = volume: 290 mL. Mass identified at anterior uterine fundus, transmural, 5.1 x 4.6 x 5.1 cm likely representing a  leiomyoma. Heterogeneous myometrium without additional masses. Endometrium Thickness: 9 mm.  No definite endometrial fluid or focal abnormality Right ovary Not visualized, likely obscured by bowel Left ovary Not visualized, likely obscured by bowel Other findings No free pelvic fluid.  No adnexal masses. IMPRESSION: Large leiomyoma at anterior upper uterine segment 5 0.1 cm diameter, extends submucosal. Nonvisualization of ovaries. Electronically Signed   By: Lavonia Dana M.D.   On: 12/19/2019 10:05     A/P 54 yo with AUB and fibroid uterus - Discussed surgical management with endometrial biopsy vs hysterectomy - Risks, benefits and alternatives of both procedures were reviewed with the patient. Patient is undecided regarding surgery. Patient will contact office with decision - Discussed medical management with depo-provera until she decides. Discussed that may continue depo-provera if AUB resolves - Patient referred to family medicine for HTN control. - information on both procedures and list of PCP provided

## 2020-01-14 ENCOUNTER — Telehealth: Payer: Self-pay

## 2020-01-14 NOTE — Telephone Encounter (Signed)
Pt called to make Dr.Constant aware she does want to proceed with Hysterectomy pt states she has upcoming appt with PCP to manage B/P.

## 2020-01-20 ENCOUNTER — Encounter: Payer: Medicaid Other | Attending: Obstetrics and Gynecology | Admitting: Registered"

## 2020-01-20 ENCOUNTER — Other Ambulatory Visit: Payer: Self-pay

## 2020-01-20 ENCOUNTER — Encounter: Payer: Self-pay | Admitting: Registered"

## 2020-01-20 VITALS — Ht 63.0 in | Wt 314.0 lb

## 2020-01-20 DIAGNOSIS — Z713 Dietary counseling and surveillance: Secondary | ICD-10-CM | POA: Insufficient documentation

## 2020-01-20 NOTE — Progress Notes (Signed)
Medical Nutrition Therapy:  Appt start time: 0930 end time:  1030.   Assessment:  Primary concerns today: Would like to reduce blood pressure and reduce weight.   Pt states her BP was 194/119 at last MD visit. Pt states she is monitoring BP at home and is 144/92. Pt states she wants to get her BP down for procedure to address her perimenopausal abnormal bleeding. Patient is confused that all the sudden has had high BP. Pt states she has very little energy. Pt states her lab work indicates her iron levels are fine. Pt denies having Vit D lab work.  Pt states her husband has diabetes and kidney disease so she cooks meals that are appropriate for him. Due to her family history of T2DM, patient checks her blood sugar and in normal range  Stress 10/10  Sleep: 5.5 - 6 hrs woke up due to too hot. Naps during day occasionally. Does not have TV in bedroom  Patient states she has one son doing virtural learning and due to ADHD is sometimes difficult to hellp him stay focused.   Pt states she watches approx 6 hrs of television per day including morning and evening news.  Family Hx: T2DM, Heart disease  Preferred Learning Style:   No preference indicated   Learning Readiness:   Ready   MEDICATIONS: reviewed (BP)   DIETARY INTAKE:  Usual eating pattern includes 3 meals and 2 snacks per day.   24-hr recall:  B ( AM): egg whites, oatmeal OR meal replacement shake [employer], juice occasionally   Snk ( AM): fruit, nuts   L ( PM): salad, fruit & nuts & chicken, [place] dressing OR salmon, couscous, vegetables Snk ( PM): none OR protein bar D ( PM): lite, sometimes skips) baked chicken, salad Snk ( PM): a little piece of chicken Beverages: water  Usual physical activity: ADLs; Pt states she was walking 1.5 miles or 20 min 3/week zumba at home. Pt is having difficulty leaving house due to heavy bleeding.  Estimated energy needs: 1800 calories  Progress Towards Goal(s):  New  goals.   Nutritional Diagnosis:  NB-1.1 Food and nutrition-related knowledge deficit As related to vegetable intake relationship to blood pressure.  As evidenced by pt stated new learning.    Intervention:  Nutrition Education topcs: DASH diet Stress reduction options Physical activity  Plan: Consider reading through the [facility] handout for ideas. Consider skipping the evening news to help with calming down before bedtime Consider looking into stress reduction strategies Consider ways to get more physical activity, resistance bands  Teaching Method Utilized:  Visual Auditory  Handouts given during visit include:  DASH diet  MyPlate Planner  Morning metabolism Boosters  Barriers to learning/adherence to lifestyle change: none  Demonstrated degree of understanding via:  Teach Back   Monitoring/Evaluation:  Dietary intake, exercise, stress level, sleep, and body weight in 2 month(s).

## 2020-01-20 NOTE — Progress Notes (Signed)
Patient was assessed and managed by nursing staff during this encounter. I have reviewed the chart and agree with the documentation and plan. I have also made any necessary editorial changes.  Mora Bellman, MD 01/20/2020 3:37 PM

## 2020-01-20 NOTE — Patient Instructions (Signed)
Consider reading through the [facility] handout for ideas. Consider skipping the evening news to help with calming down before bedtime Consider looking into stress reduction strategies Consider ways to get more physical activity, resistance bands

## 2020-01-21 ENCOUNTER — Ambulatory Visit: Payer: Medicaid Other | Admitting: Registered"

## 2020-01-29 ENCOUNTER — Telehealth: Payer: Self-pay

## 2020-01-29 NOTE — Telephone Encounter (Signed)

## 2020-01-29 NOTE — Patient Instructions (Signed)
Thank you for choosing Primary Care at [facility] to be your medical home!    Carolyn Mcdonald was seen by Melina Schools, DO today.   Hillard Danker primary care provider is Phill Myron, DO.   For the best care possible, you should try to see Phill Myron, DO whenever you come to the clinic.   We look forward to seeing you again soon!  If you have any questions about your visit today, please call us at [phone] or feel free to reach your primary care provider via [place].

## 2020-01-30 ENCOUNTER — Ambulatory Visit (INDEPENDENT_AMBULATORY_CARE_PROVIDER_SITE_OTHER): Payer: Medicaid Other | Admitting: Internal Medicine

## 2020-01-30 ENCOUNTER — Other Ambulatory Visit: Payer: Self-pay

## 2020-01-30 ENCOUNTER — Encounter: Payer: Self-pay | Admitting: Internal Medicine

## 2020-01-30 ENCOUNTER — Encounter: Payer: Self-pay | Admitting: Gastroenterology

## 2020-01-30 VITALS — BP 152/112 | HR 109 | Temp 97.2°F | Resp 18 | Ht 63.0 in | Wt 312.0 lb

## 2020-01-30 DIAGNOSIS — Z6841 Body Mass Index (BMI) 40.0 and over, adult: Secondary | ICD-10-CM | POA: Insufficient documentation

## 2020-01-30 DIAGNOSIS — Z1321 Encounter for screening for nutritional disorder: Secondary | ICD-10-CM

## 2020-01-30 DIAGNOSIS — Z1211 Encounter for screening for malignant neoplasm of colon: Secondary | ICD-10-CM | POA: Diagnosis not present

## 2020-01-30 DIAGNOSIS — E66813 Obesity, class 3: Secondary | ICD-10-CM

## 2020-01-30 DIAGNOSIS — I10 Essential (primary) hypertension: Secondary | ICD-10-CM | POA: Diagnosis not present

## 2020-01-30 DIAGNOSIS — Z7689 Persons encountering health services in other specified circumstances: Secondary | ICD-10-CM

## 2020-01-30 DIAGNOSIS — Z114 Encounter for screening for human immunodeficiency virus [HIV]: Secondary | ICD-10-CM | POA: Diagnosis not present

## 2020-01-30 DIAGNOSIS — D219 Benign neoplasm of connective and other soft tissue, unspecified: Secondary | ICD-10-CM | POA: Insufficient documentation

## 2020-01-30 DIAGNOSIS — Z131 Encounter for screening for diabetes mellitus: Secondary | ICD-10-CM

## 2020-01-30 HISTORY — DX: Body Mass Index (BMI) 40.0 and over, adult: Z684

## 2020-01-30 HISTORY — DX: Obesity, class 3: E66.813

## 2020-01-30 MED ORDER — AMLODIPINE BESYLATE 10 MG PO TABS: 10.0000 mg | ORAL_TABLET | Freq: Every day | ORAL | 3 refills | Status: DC

## 2020-01-30 NOTE — Progress Notes (Signed)
  Subjective:    Lakiya Maccini - 54 y.o. female MRN [id number]  Date of birth: 1966/01/04  HPI  Burkley Stuve is to establish care. Patient has a PMH significant for HTN.   Chronic HTN Disease Monitoring:  Home BP Monitoring - Yes. Typically upper 140s/90s.  Chest pain- no  Dyspnea- no Headache - no  Medications: Amlodipine 5 mg  Compliance- yes Lightheadedness- no  Edema- no     ROS per HPI     Health Maintenance:  Health Maintenance Due  Topic Date Due  . HIV Screening  Never done  . COVID-19 Vaccine (1) Never done  . TETANUS/TDAP  Never done  . MAMMOGRAM  Never done  . COLONOSCOPY  Never done     Past Medical History: Patient Active Problem List   Diagnosis Date Noted  . Nutritional counseling 01/20/2020      Social History   reports that she has never smoked. She has never used smokeless tobacco. She reports that she does not drink alcohol or use drugs.   Family History  family history is not on file.   Medications: reviewed and updated   Objective:   Physical Exam Temp (!) 97.2 F (36.2 C) (Temporal)   Ht 5\' 3"  (1.6 m)   Wt (!) 312 lb (141.5 kg)   LMP 01/06/2020 (Exact Date)   BMI 55.27 kg/m  Physical Exam  Constitutional: She is oriented to person, place, and time and well-developed, well-nourished, and in no distress. No distress.  HENT:  Head: Normocephalic and atraumatic.  Eyes: Conjunctivae and EOM are normal.  Cardiovascular: Normal rate, regular rhythm and normal heart sounds.  No murmur heard. Pulmonary/Chest: Effort normal and breath sounds normal. No respiratory distress.  Musculoskeletal:        General: Normal range of motion.  Neurological: She is alert and oriented to person, place, and time.  Skin: Skin is warm and dry. She is not diaphoretic.  Psychiatric: Affect and judgment normal.       Assessment & Plan:   1. Encounter to establish care Reviewed patient's PMH, social history, surgical history,  and medications.   2. Essential hypertension BP above goal today at office and on home readings. Increase Amlodipine to 5 mg. Continue to monitor a thome. Return in 6-8 weeks for follow up.  - Comprehensive metabolic panel - amLODipine (NORVASC) 10 MG tablet; Take 1 tablet (10 mg total) by mouth daily.  Dispense: 90 tablet; Refill: 3  3. Screening for HIV (human immunodeficiency virus) - HIV antibody (with reflex)  4. Colon cancer screening - Ambulatory referral to Gastroenterology  5. Morbid obesity ([place]) Followed by nutrition. Continue to work on diet and exercise changes.  - Hemoglobin A1c - TSH  6. Screening for diabetes mellitus (DM) - Hemoglobin A1c  7. Encounter for vitamin deficiency screening - Vitamin D, 25-hydroxy     Phill Myron, D.O. 01/30/2020, 2:55 PM Primary Care at [facility]

## 2020-01-31 ENCOUNTER — Encounter: Payer: Self-pay | Admitting: Internal Medicine

## 2020-01-31 DIAGNOSIS — E559 Vitamin D deficiency, unspecified: Secondary | ICD-10-CM | POA: Insufficient documentation

## 2020-01-31 DIAGNOSIS — R7303 Prediabetes: Secondary | ICD-10-CM

## 2020-01-31 HISTORY — DX: Vitamin D deficiency, unspecified: E55.9

## 2020-01-31 HISTORY — DX: Prediabetes: R73.03

## 2020-01-31 LAB — COMPREHENSIVE METABOLIC PANEL
ALT: 13 IU/L (ref 0–32)
AST: 19 IU/L (ref 0–40)
Albumin/Globulin Ratio: 1.2 (ref 1.2–2.2)
Albumin: 4 g/dL (ref 3.8–4.9)
Alkaline Phosphatase: 83 IU/L (ref 48–121)
BUN/Creatinine Ratio: 15 (ref 9–23)
BUN: 13 mg/dL (ref 6–24)
Bilirubin Total: 0.4 mg/dL (ref 0.0–1.2)
CO2: 22 mmol/L (ref 20–29)
Calcium: 9.2 mg/dL (ref 8.7–10.2)
Chloride: 105 mmol/L (ref 96–106)
Creatinine, Ser: 0.84 mg/dL (ref 0.57–1.00)
GFR calc Af Amer: 90 mL/min/{1.73_m2} (ref >59)
GFR calc non Af Amer: 78 mL/min/{1.73_m2} (ref >59)
Globulin, Total: 3.3 g/dL (ref 1.5–4.5)
Glucose: 95 mg/dL (ref 65–99)
Potassium: 4 mmol/L (ref 3.5–5.2)
Sodium: 141 mmol/L (ref 134–144)
Total Protein: 7.3 g/dL (ref 6.0–8.5)

## 2020-01-31 LAB — TSH: TSH: 1.22 u[IU]/mL (ref 0.450–4.500)

## 2020-01-31 LAB — HEMOGLOBIN A1C
Est. average glucose Bld gHb Est-mCnc: 120 mg/dL
Hgb A1c MFr Bld: 5.8 % — ABNORMAL HIGH (ref 4.8–5.6)

## 2020-01-31 LAB — VITAMIN D 25 HYDROXY (VIT D DEFICIENCY, FRACTURES): Vit D, 25-Hydroxy: 22.7 ng/mL — ABNORMAL LOW (ref 30.0–100.0)

## 2020-01-31 LAB — HIV ANTIBODY (ROUTINE TESTING W REFLEX): HIV Screen 4th Generation wRfx: NONREACTIVE

## 2020-02-13 ENCOUNTER — Ambulatory Visit
Admission: RE | Admit: 2020-02-13 | Discharge: 2020-02-13 | Disposition: A | Discharge status: Home / Self Care | Payer: Medicaid Other | Source: Ambulatory Visit | Attending: Obstetrics and Gynecology | Admitting: Obstetrics and Gynecology

## 2020-02-13 ENCOUNTER — Other Ambulatory Visit: Payer: Self-pay

## 2020-02-13 DIAGNOSIS — Z1231 Encounter for screening mammogram for malignant neoplasm of breast: Secondary | ICD-10-CM

## 2020-02-21 ENCOUNTER — Telehealth: Payer: Self-pay | Admitting: *Deleted

## 2020-02-21 NOTE — Telephone Encounter (Signed)
Dr Loletha Carrow,  This pt is a direct screening colon with you. She has no GI hx. She has a BMI of 55.28.  She has hypertension.  Do you want her to have an OV or direct at the hospital?  Please advise, thanks so much for your time, Marijean Niemann

## 2020-02-26 ENCOUNTER — Telehealth: Payer: Self-pay

## 2020-02-26 NOTE — Telephone Encounter (Signed)
Patient notified of Dr.Danis's recommendations that her colonoscopy will need to be done at [facility] and she will be put on a wait list. Her colon and PV is cancelled at this time-pt is aware.   This phone note was sent to Rosanne Sack, RN.

## 2020-02-26 NOTE — Telephone Encounter (Signed)
Sh needs to go on the waiting list for outpatient procedures in my [facility] block.   We are booked out months for routine colonoscopies at the hospital.

## 2020-02-26 NOTE — Telephone Encounter (Signed)

## 2020-02-26 NOTE — Telephone Encounter (Signed)
Pt has been added to the wait list.

## 2020-02-27 ENCOUNTER — Other Ambulatory Visit: Payer: Self-pay

## 2020-02-27 ENCOUNTER — Ambulatory Visit (INDEPENDENT_AMBULATORY_CARE_PROVIDER_SITE_OTHER): Payer: Medicaid Other | Admitting: Internal Medicine

## 2020-02-27 ENCOUNTER — Encounter: Payer: Self-pay | Admitting: Internal Medicine

## 2020-02-27 VITALS — BP 161/108 | HR 107 | Temp 97.2°F | Resp 17 | Wt 312.0 lb

## 2020-02-27 DIAGNOSIS — R600 Localized edema: Secondary | ICD-10-CM

## 2020-02-27 DIAGNOSIS — I10 Essential (primary) hypertension: Secondary | ICD-10-CM

## 2020-02-27 MED ORDER — HYDROCHLOROTHIAZIDE 12.5 MG PO CAPS: 12.5000 mg | ORAL_CAPSULE | Freq: Every day | ORAL | 1 refills | Status: DC

## 2020-02-27 NOTE — Progress Notes (Signed)
Subjective:    Carolyn Mcdonald - 54 y.o. female MRN [id number]  Date of birth: [birth year]  HPI  Carolyn Mcdonald is here for LE edema. Has been present for a few weeks, but worsened over the past 4 days where it is now constant. Present in feet, including toes, up to mid shins/below knees. Left more swollen than right. Reports left calf has been painful although she does have a varicose vein in that area. Was initially improving with elevation but doesn't seem to be any longer. Has not tried compression or any other treatments. No PMH of CHF, renal failure, liver failure, thyroid disease, anemia. Denies SOB, chest pain, DOE, fevers, leg erythema, leg rash. Notably, Amlodipine dose was increased from 5 to 10 mg at office visit on 5/20.   Health Maintenance:  Health Maintenance Due  Topic Date Due  . Hepatitis C Screening  Never done  . COVID-19 Vaccine (1) Never done  . TETANUS/TDAP  Never done  . COLONOSCOPY  Never done   -  reports that she has never smoked. She has never used smokeless tobacco. - Review of Systems: Per HPI. - Past Medical History: Patient Active Problem List   Diagnosis Date Noted  . Prediabetes 01/31/2020  . Vitamin D deficiency 01/31/2020  . Essential hypertension 01/30/2020  . Fibroid 01/30/2020  . Morbid obesity ([place]) 01/30/2020  . Nutritional counseling 01/20/2020   - Medications: reviewed and updated   Objective:   Physical Exam BP (!) 161/108   Pulse (!) 107   Temp (!) 97.2 F (36.2 C) (Temporal)   Resp 17   Wt (!) 312 lb (141.5 kg)   LMP 02/13/2020   SpO2 95%   BMI 55.27 kg/m  Physical Exam Constitutional:      General: She is not in acute distress.    Appearance: She is not diaphoretic.  HENT:     Head: Normocephalic and atraumatic.  Eyes:     Conjunctiva/sclera: Conjunctivae normal.  Cardiovascular:     Rate and Rhythm: Normal rate and regular rhythm.     Heart sounds: Normal heart sounds. No murmur heard.      Comments: 1-2+ non  pitting edema to mid shins, L>R. Left calf tenderness present. Negative Homan's sign.  Pulmonary:     Effort: Pulmonary effort is normal. No respiratory distress.     Breath sounds: Normal breath sounds.  Musculoskeletal:        General: Normal range of motion.  Skin:    General: Skin is warm and dry.  Neurological:     Mental Status: She is alert and oriented to person, place, and time.  Psychiatric:        Mood and Affect: Affect normal.        Judgment: Judgment normal.    Assessment & Plan:   1. Bilateral leg edema Suspect related to increase in Amlodipine. Patient would like to d/c completely as opposed to lower the dose. Will do so to see if edema resolves. Given L LE edema > right and pain to left calf will obtain sono to r/o DVT. Reassuring her exam is otherwise benign and she has recent labs that are negative for renal abnormality, abnormal liver enzymes, anemia, thyroid abnormality.  - VAS [place] LOWER EXTREMITY VENOUS (DVT); Future  2. Essential hypertension BP above goal. Discussed will need to start another agent given discontinuation of Amlodipine. Will start HCTZ. Might have mild diuretic effect too for edema. Return in two weeks for BMET and BP check with  [facility].  - hydrochlorothiazide (MICROZIDE) 12.5 MG capsule; Take 1 capsule (12.5 mg total) by mouth daily.  Dispense: 30 capsule; Refill: 1 - Basic metabolic panel; Future   Phill Myron, D.O. 02/28/2020, 2:06 PM Primary Care at [facility]

## 2020-03-09 ENCOUNTER — Ambulatory Visit (HOSPITAL_COMMUNITY)
Admission: RE | Admit: 2020-03-09 | Payer: Medicaid Other | Source: Ambulatory Visit | Attending: Internal Medicine | Admitting: Internal Medicine

## 2020-03-11 ENCOUNTER — Ambulatory Visit (HOSPITAL_COMMUNITY)
Admission: RE | Admit: 2020-03-11 | Discharge: 2020-03-11 | Disposition: A | Discharge status: Home / Self Care | Payer: Medicaid Other | Source: Ambulatory Visit | Attending: Cardiovascular Disease | Admitting: Cardiovascular Disease

## 2020-03-11 ENCOUNTER — Other Ambulatory Visit: Payer: Self-pay | Admitting: Internal Medicine

## 2020-03-11 ENCOUNTER — Other Ambulatory Visit: Payer: Self-pay

## 2020-03-11 DIAGNOSIS — R600 Localized edema: Secondary | ICD-10-CM | POA: Diagnosis not present

## 2020-03-11 DIAGNOSIS — I82432 Acute embolism and thrombosis of left popliteal vein: Secondary | ICD-10-CM

## 2020-03-11 MED ORDER — RIVAROXABAN 15 MG PO TABS: 15.0000 mg | ORAL_TABLET | Freq: Two times a day (BID) | ORAL | 0 refills | Status: DC

## 2020-03-11 NOTE — Progress Notes (Signed)
Patient went to scheduled outpatient ultrasound today for bilateral LE edema, left >right. Received call from vascular imaging for preliminary report of age indeterminate left popliteal vein DVT. Spoke with patient on the phone as well and informed of results. She has no known allergies. Has never taken anticoagulation in the past. Not on any ASA or antiplatelet therapies. She denies chest pain, SOB, cough. Discussed  that we will need to start anticoagulation. Sent Rx for Xarelto starter pack 15 mg BID x21 days to pharmacy. Discussed how to take medication and to watch for excessive bleeding. Will have patient scheduled to return for clinic visit to discuss future management of new diagnosis. Counseled on symptoms of worsening DVT and/or PE that would warrant ER.   Phill Myron, D.O. Primary Care at [facility]  03/11/2020, 1:18 PM

## 2020-03-12 ENCOUNTER — Ambulatory Visit (INDEPENDENT_AMBULATORY_CARE_PROVIDER_SITE_OTHER): Payer: Self-pay | Admitting: Internal Medicine

## 2020-03-12 ENCOUNTER — Ambulatory Visit: Payer: Medicaid Other

## 2020-03-12 ENCOUNTER — Other Ambulatory Visit: Payer: Self-pay | Admitting: Internal Medicine

## 2020-03-12 VITALS — BP 156/98 | HR 102 | Temp 97.3°F | Resp 17 | Wt 312.0 lb

## 2020-03-12 DIAGNOSIS — I82432 Acute embolism and thrombosis of left popliteal vein: Secondary | ICD-10-CM

## 2020-03-12 DIAGNOSIS — I10 Essential (primary) hypertension: Secondary | ICD-10-CM

## 2020-03-12 MED ORDER — HYDROCHLOROTHIAZIDE 25 MG PO TABS: 25.0000 mg | ORAL_TABLET | Freq: Every day | ORAL | 3 refills | Status: DC

## 2020-03-12 MED ORDER — RIVAROXABAN 15 MG PO TABS: 15.0000 mg | ORAL_TABLET | Freq: Two times a day (BID) | ORAL | 0 refills | Status: DC

## 2020-03-12 NOTE — Progress Notes (Signed)
Subjective:    Carolyn Mcdonald - 54 y.o. female MRN [id number]  Date of birth: Nov 10, 1965  HPI  Carolyn Mcdonald is here for follow up of HTN. Was diagnosed with left proximal DVT yesterday.   DVT: Patient reports no car rides longer than 1.5-2 hours. No recent travel by airplane. No personal history of blood clotting disorder. She does report that her mother was on Eliquis for a long time reportedly for CHF history but then apparently had two blood clots on Eliquis and was considered failed treatment and was switched to a different medication. Is unaware of family history of clotting disorder. No recent estrogen exposure. She has been unable to fill her Xarelto yet due to lack of stock at her typical pharmacy.    Chronic HTN Disease Monitoring:  Home BP Monitoring - Yes 140-150/90s  Chest pain- no  Dyspnea- no Headache - no  Medications: HCTZ 12.5 mg  Compliance- yes Lightheadedness- no  Edema- no     Health Maintenance:  Health Maintenance Due  Topic Date Due  . COVID-19 Vaccine (1) Never done    -  reports that she has never smoked. She has never used smokeless tobacco. - Review of Systems: Per HPI. - Past Medical History: Patient Active Problem List   Diagnosis Date Noted  . Prediabetes 01/31/2020  . Vitamin D deficiency 01/31/2020  . Essential hypertension 01/30/2020  . Fibroid 01/30/2020  . Morbid obesity ([place]) 01/30/2020  . Nutritional counseling 01/20/2020   - Medications: reviewed and updated   Objective:   Physical Exam BP (!) 156/98   Pulse (!) 102   Temp (!) 97.3 F (36.3 C) (Temporal)   Resp 17   Wt (!) 141.5 kg   LMP 02/13/2020   SpO2 94%   BMI 55.27 kg/m  Physical Exam Constitutional:      General: She is not in acute distress.    Appearance: She is not diaphoretic.  Cardiovascular:     Rate and Rhythm: Normal rate.  Pulmonary:     Effort: Pulmonary effort is normal. No respiratory distress.  Musculoskeletal:         General: Normal range of motion.  Skin:    General: Skin is warm and dry.  Neurological:     Mental Status: She is alert and oriented to person, place, and time.  Psychiatric:        Mood and Affect: Affect normal.        Judgment: Judgment normal.            Assessment & Plan:   1. Essential hypertension BP is above goal today. Increase HCTZ to 25 mg. Return within 2-3 weeks for lab monitoring and to follow up on BP.  Counseled on blood pressure goal of less than 130/80, low-sodium, DASH diet, medication compliance, 150 minutes of moderate intensity exercise per week. Discussed medication compliance, adverse effects. - hydrochlorothiazide (HYDRODIURIL) 25 MG tablet; Take 1 tablet (25 mg total) by mouth daily.  Dispense: 90 tablet; Refill: 3  2. Acute deep vein thrombosis (DVT) of popliteal vein of left lower extremity (HCC) Unknown etiology, seems to be unprovoked. Xarelto sent to another pharmacy. Discussed how to take the medication. Return prior to 21 days to switch to non starter pack, discussed will change to 20 mg daily. Refer to hematology to help guide decision making about whether she would warrant indefinite anticoagulation.  - Ambulatory referral to Hematology - Rivaroxaban (XARELTO) 15 MG TABS tablet; Take 1 tablet (15 mg total) by mouth  2 (two) times daily with a meal.  Dispense: 42 tablet; Refill: 0      Phill Myron, D.O. 03/19/2020, 7:56 PM Primary Care at [facility]

## 2020-03-13 MED FILL — XARELTO 15 MG TABLET: 15 | 21 days supply | Qty: 42 | Fill #0

## 2020-03-13 MED FILL — HYDROCHLOROTHIAZIDE 25 MG T: 25 | 90 days supply | Qty: 90 | Fill #0

## 2020-03-19 ENCOUNTER — Encounter: Payer: Medicaid Other | Admitting: Gastroenterology

## 2020-03-20 ENCOUNTER — Encounter: Payer: Medicaid Other | Attending: Obstetrics and Gynecology | Admitting: Registered"

## 2020-03-20 ENCOUNTER — Other Ambulatory Visit: Payer: Self-pay

## 2020-03-20 ENCOUNTER — Telehealth: Payer: Self-pay | Admitting: Internal Medicine

## 2020-03-20 DIAGNOSIS — Z713 Dietary counseling and surveillance: Secondary | ICD-10-CM | POA: Diagnosis not present

## 2020-03-20 NOTE — Telephone Encounter (Signed)
Pt called asking what is the best excise for her with the blood clot wanted to know if walking is ok.

## 2020-03-20 NOTE — Patient Instructions (Signed)
Continue doing things that work to help reduce stress Consider finding a park to walk in [facility] is a good one) while listening to your inspirational podcasts Call your doctor about xarelto and vegetable intake and about increasing your exercise Continue to develop nighttime routine for good sleep

## 2020-03-20 NOTE — Telephone Encounter (Signed)
Please advise 

## 2020-03-20 NOTE — Progress Notes (Signed)
Medical Nutrition Therapy:  Appt start time: 0815 end time:  0840.   Follow-up Assessment:  Primary concerns today: Would like to continue reducing stress and blood pressure and weight.   Pt states her BP continues to be high at MD visits. Pt states home monitoring has shown decrease in her systolic number but her diastolic number continues to be elevated. Pt reports she stopped drinking coffee.  Pt states she stays busy with helping her husband who has started chemotherapy. (Pt reported he also has DM and CKD at last visit)  Pt states since last visit she has started medication for blood clot in her leg. Although this is stressful news, patient has reduced her overall stress response.  Stress 6/10 (reduced from 10/10). Prayer and listening to music, gospel and motivational speakers. Sleep: states is not getting more sleep, but is feeling more rested. Has started having a nighttime routine to help her relax before bed and going to sleep earlier.  Preferred Learning Style:   No preference indicated   Learning Readiness:   Ready  MEDICATIONS: reviewed (BP)   DIETARY INTAKE:  Usual eating pattern includes 3 meals and 2 snacks per day.   24-hr recall:  B ( AM): cereal, fruit, water  Snk ( AM): fruit, apples pears  L ( PM): lean meat, < 1/2 c couscous, vegetables Snk ( PM): fruit OR protein bar D ( PM):baked chicken, green beans, onions Snk ( PM):  Beverages: water  Usual physical activity: ADLs  Estimated energy needs: 1800 calories  Progress Towards Goal(s):  New goals.   Nutritional Diagnosis:  NB-1.1 Food and nutrition-related knowledge deficit As related to vegetable intake relationship to blood pressure.  As evidenced by pt stated new learning.    Intervention:  Nutrition Education topcs: Xarelto and vegetables may not be an issue Paying attention to progress, even if subtle changes  Plan: Continue doing things that work to help reduce stress Consider finding a  park to walk in [employer] is a good one) while listening to your inspirational podcasts Call your doctor about xarelto and vegetable intake and about increasing your exercise Continue to develop nighttime routine for good sleep  Teaching Method Utilized:  Visual Auditory  Handouts given during visit include:  none  Barriers to learning/adherence to lifestyle change: none  Demonstrated degree of understanding via:  Teach Back   Monitoring/Evaluation:  Dietary intake, exercise, stress level, sleep, and body weight in 2 month(s).

## 2020-03-23 NOTE — Telephone Encounter (Signed)
After a blood clot, it is fine to resume normal physical activity. Walking is a great exercise.   Phill Myron, D.O. Primary Care at [facility]  03/23/2020, 1:31 PM

## 2020-03-24 ENCOUNTER — Telehealth: Payer: Self-pay | Admitting: Oncology

## 2020-03-24 NOTE — Telephone Encounter (Signed)
Received a new hem referral from Dr. Juleen China for DVT. Ms. Carolyn Mcdonald has been cld and scheduled to see Dr. Alen Blew on 7/29 at 11am. Pt aware to arrive 15 minutes early.

## 2020-03-25 NOTE — Telephone Encounter (Signed)
Left voice mail to call back 

## 2020-03-31 ENCOUNTER — Other Ambulatory Visit: Payer: Self-pay | Admitting: Internal Medicine

## 2020-03-31 ENCOUNTER — Telehealth: Payer: Self-pay | Admitting: Internal Medicine

## 2020-03-31 ENCOUNTER — Telehealth: Payer: Medicaid Other | Admitting: Internal Medicine

## 2020-03-31 MED ORDER — RIVAROXABAN 20 MG PO TABS
20.0000 mg | ORAL_TABLET | Freq: Every day | ORAL | 1 refills | Status: DC
Start: 2020-03-31 — End: 2020-10-02

## 2020-03-31 NOTE — Telephone Encounter (Signed)
Patient called saying she only has 3 more day of her Rivaroxaban (XARELTO) 15 MG TABS tablet And would like to know if her PCP is going to call in another Rx for Xarelto. Patient states she does not see her Hematologist until 04/09/20. Please f/u

## 2020-03-31 NOTE — Telephone Encounter (Signed)
Patient notified of medication change. Repeated back new dosing instructions correctly.

## 2020-03-31 NOTE — Telephone Encounter (Signed)
Please advise. I'm not sure if a dose adjustment needs to be made since it's 3 weeks since she's started.

## 2020-03-31 NOTE — Telephone Encounter (Signed)
Yes, once she's done with the 21 day starter park of 15 mg BID, she will start 20 mg daily. I sent the new Rx for the new dose into [facility] pharmacy since that's where we sent the last Rx for anticoagulation.   Phill Myron, D.O. Primary Care at [facility]  03/31/2020, 4:40 PM

## 2020-04-01 MED FILL — XARELTO 20 MG TABLET: 20 | 90 days supply | Qty: 90 | Fill #0

## 2020-04-02 ENCOUNTER — Telehealth: Payer: Self-pay

## 2020-04-02 NOTE — Telephone Encounter (Signed)
Pt called and reports that she is still having symptoms of AUB with clots. Pt denies bleeding heavy, she says she is not filling up more than 1 pad per hour. I advised pt that if this starts happening she will need to go to the hospital for evaluation. Pt reports she cannot have hysterectomy right now as previously discussed with Dr. Elly Modena due to her BP not being under control and she has a blood clot in her leg, she is managed for these with her PCP Dr. Juleen China. I advised pt that she should schedule a virtual f/u appt with Dr. Elly Modena to discuss what options she has, pt voices understanding.

## 2020-04-05 ENCOUNTER — Emergency Department (HOSPITAL_COMMUNITY)
Admission: EM | Admit: 2020-04-05 | Discharge: 2020-04-06 | Disposition: A | Discharge status: Home / Self Care | Payer: Medicaid Other | Attending: Emergency Medicine | Admitting: Emergency Medicine

## 2020-04-05 ENCOUNTER — Encounter (HOSPITAL_COMMUNITY): Payer: Self-pay | Admitting: Emergency Medicine

## 2020-04-05 ENCOUNTER — Other Ambulatory Visit: Payer: Self-pay

## 2020-04-05 DIAGNOSIS — R102 Pelvic and perineal pain: Secondary | ICD-10-CM | POA: Diagnosis not present

## 2020-04-05 DIAGNOSIS — E876 Hypokalemia: Secondary | ICD-10-CM | POA: Diagnosis not present

## 2020-04-05 DIAGNOSIS — I10 Essential (primary) hypertension: Secondary | ICD-10-CM | POA: Diagnosis not present

## 2020-04-05 DIAGNOSIS — Z79899 Other long term (current) drug therapy: Secondary | ICD-10-CM | POA: Diagnosis not present

## 2020-04-05 DIAGNOSIS — Z7901 Long term (current) use of anticoagulants: Secondary | ICD-10-CM | POA: Insufficient documentation

## 2020-04-05 DIAGNOSIS — N939 Abnormal uterine and vaginal bleeding, unspecified: Secondary | ICD-10-CM | POA: Diagnosis present

## 2020-04-05 DIAGNOSIS — D649 Anemia, unspecified: Secondary | ICD-10-CM

## 2020-04-05 HISTORY — DX: Benign neoplasm of connective and other soft tissue, unspecified: D21.9

## 2020-04-05 LAB — COMPREHENSIVE METABOLIC PANEL
ALT: 15 U/L (ref 0–44)
AST: 17 U/L (ref 15–41)
Albumin: 3.1 g/dL — ABNORMAL LOW (ref 3.5–5.0)
Alkaline Phosphatase: 57 U/L (ref 38–126)
Anion gap: 10 (ref 5–15)
BUN: 15 mg/dL (ref 6–20)
CO2: 24 mmol/L (ref 22–32)
Calcium: 8.7 mg/dL — ABNORMAL LOW (ref 8.9–10.3)
Chloride: 106 mmol/L (ref 98–111)
Creatinine, Ser: 0.87 mg/dL (ref 0.44–1.00)
GFR calc Af Amer: >60 mL/min (ref >60)
GFR calc non Af Amer: >60 mL/min (ref >60)
Glucose, Bld: 113 mg/dL — ABNORMAL HIGH (ref 70–99)
Potassium: 3.2 mmol/L — ABNORMAL LOW (ref 3.5–5.1)
Sodium: 140 mmol/L (ref 135–145)
Total Bilirubin: 0.6 mg/dL (ref 0.3–1.2)
Total Protein: 7 g/dL (ref 6.5–8.1)

## 2020-04-05 LAB — CBC
HCT: 32.4 % — ABNORMAL LOW (ref 36.0–46.0)
Hemoglobin: 10.3 g/dL — ABNORMAL LOW (ref 12.0–15.0)
MCH: 28.2 pg (ref 26.0–34.0)
MCHC: 31.8 g/dL (ref 30.0–36.0)
MCV: 88.8 fL (ref 80.0–100.0)
Platelets: 263 10*3/uL (ref 150–400)
RBC: 3.65 MIL/uL — ABNORMAL LOW (ref 3.87–5.11)
RDW: 13.2 % (ref 11.5–15.5)
WBC: 5.8 10*3/uL (ref 4.0–10.5)
nRBC: 0 % (ref 0.0–0.2)

## 2020-04-05 LAB — I-STAT BETA HCG BLOOD, ED (MC, WL, AP ONLY): I-stat hCG, quantitative: <5 m[IU]/mL (ref <5)

## 2020-04-05 LAB — LIPASE, BLOOD: Lipase: 24 U/L (ref 11–51)

## 2020-04-05 MED ORDER — SODIUM CHLORIDE 0.9% FLUSH: 3.0000 mL | Freq: Once | INTRAVENOUS | Status: DC

## 2020-04-05 NOTE — ED Triage Notes (Signed)
Pt reports history of fibroids.  Reports vaginal bleeding since Thursday.  Increased bleeding with large clots since 1pm.  Using >2 pads per hour.  Reports vaginal and rectal pain/pressure/burning.

## 2020-04-06 LAB — WET PREP, GENITAL
Clue Cells Wet Prep HPF POC: NONE SEEN
Sperm: NONE SEEN
Trich, Wet Prep: NONE SEEN
Yeast Wet Prep HPF POC: NONE SEEN

## 2020-04-06 LAB — GC/CHLAMYDIA PROBE AMP (~~LOC~~) NOT AT ARMC
Chlamydia: NEGATIVE
Comment: NEGATIVE
Comment: NORMAL
Neisseria Gonorrhea: NEGATIVE

## 2020-04-06 MED ORDER — POTASSIUM CHLORIDE CRYS ER 20 MEQ PO TBCR
40.0000 meq | EXTENDED_RELEASE_TABLET | Freq: Once | ORAL | Status: AC
  Administered 2020-04-06: 40 meq via ORAL
  Filled 2020-04-06: qty 2

## 2020-04-06 NOTE — ED Notes (Signed)
Patient verbalizes understanding of discharge instructions. Opportunity for questioning and answers were provided. Armband removed by staff, pt discharged from ED. Pt. ambulatory and discharged home.  

## 2020-04-06 NOTE — ED Provider Notes (Signed)
[facility] EMER[facility]ENCY DEPARTMENT Provider Note   CSN: [id number] Arrival date & time: 04/05/20  1751     History Chief Complaint  Patient presents with  . Vaginal Bleeding    Carolyn Mcdonald is a 54 y.o. female past history of hypertension, uterine fibroids who presents for evaluation of vaginal bleeding.  Patient reports that she has known history of uterine fibroids and states she has had abnormal uterine bleeding since been diagnosed in March 2021.  She reports that her most recent menstrual cycle started about 3 days ago.  She reports that since then, she has had heavier bleeding.  She reports that today, she felt like the bleeding was getting worse.  She reports that she had been passing large clots and reports that she had been fully saturating more than 2 pads an hour.  She reports that her OB/GYN had told her that if she had ever started passing clots and going through more than 1 pad an hour, she was to come to the emergency department.  She states that during her wait here, she is continue to fully saturated pads.  She has had some mild lower abdominal pressure that sometimes radiates to the back.  She states she has been taking Tylenol for the pain which is has provided minimal improvement.  She reports that sometimes she feels lightheaded and fatigued.  She is currently on Xarelto for treatment of DVT, which was recently diagnosed.  She denies any fevers, chest pain, difficulty breathing, dysuria.  She is not currently sexually active.  The history is provided by the patient.       Past Medical History:  Diagnosis Date  . Fibroid   . Hypertension     Patient Active Problem List   Diagnosis Date Noted  . Prediabetes 01/31/2020  . Vitamin D deficiency 01/31/2020  . Essential hypertension 01/30/2020  . Fibroid 01/30/2020  . Morbid obesity ([place]) 01/30/2020  . Nutritional counseling 01/20/2020    Past Surgical History:  Procedure Laterality Date  .  ANKLE SURGERY     R ankle   . CESAREAN SECTION     x3  . TOE SURGERY     R Great toe   . TUBAL LIGATION  1993     OB History    Gravida  3   Para      Term      Preterm      AB      Living        SAB      TAB      Ectopic      Multiple      Live Births  3           No family history on file.  Social History   Tobacco Use  . Smoking status: Never Smoker  . Smokeless tobacco: Never Used  Substance Use Topics  . Alcohol use: Never  . Drug use: Never    Home Medications Prior to Admission medications   Medication Sig Start Date End Date Taking? Authorizing Provider  hydrochlorothiazide (HYDRODIURIL) 25 MG tablet Take 1 tablet (25 mg total) by mouth daily. 03/12/20   Nicolette Bang, DO  Multiple Vitamin (MULTIVITAMIN) tablet Take 1 tablet by mouth daily.    [provider]  rivaroxaban (XARELTO) 20 MG TABS tablet Take 1 tablet (20 mg total) by mouth daily with supper. 03/31/20   Nicolette Bang, DO    Allergies    Vicodin [hydrocodone-acetaminophen]  Review of Systems   Review of Systems  Constitutional: Negative for fever.  Respiratory: Negative for cough and shortness of breath.   Cardiovascular: Negative for chest pain.  Gastrointestinal: Positive for abdominal pain. Negative for nausea and vomiting.  Genitourinary: Positive for vaginal bleeding. Negative for dysuria and hematuria.  Neurological: Negative for headaches.  All other systems reviewed and are negative.   Physical Exam Updated Vital Signs BP (!) 158/85 (BP Location: Right Arm)   Pulse 95   Temp 99.1 F (37.3 C) (Oral)   Resp 15   Ht 5\' 3"  (1.6 m)   Wt (!) 136.1 kg   LMP 04/02/2020   SpO2 99%   BMI 53.14 kg/m   Physical Exam Vitals and nursing note reviewed.  Constitutional:      Appearance: Normal appearance. She is well-developed.  HENT:     Head: Normocephalic and atraumatic.  Eyes:     General: Lids are normal.     Conjunctiva/sclera:  Conjunctivae normal.     Pupils: Pupils are equal, round, and reactive to light.  Cardiovascular:     Rate and Rhythm: Normal rate and regular rhythm.     Pulses: Normal pulses.     Heart sounds: Normal heart sounds. No murmur heard.  No friction rub. No gallop.   Pulmonary:     Effort: Pulmonary effort is normal.     Breath sounds: Normal breath sounds.     Comments: Lungs clear to auscultation bilaterally.  Symmetric chest rise.  No wheezing, rales, rhonchi. Abdominal:     Palpations: Abdomen is soft. Abdomen is not rigid.     Tenderness: There is abdominal tenderness in the suprapubic area. There is no guarding.     Comments: Abdomen soft, nondistended.  Mild tenderness in the lower abdomen, particularly suprapubic region.  No rigidity, guarding.  Genitourinary:    Comments: The exam was performed with a chaperone present. Normal external female genitalia. No lesions, rash, or sores.  No evidence of hemorrhage.  Small amount of clots in the vaginal vault.  These were removed.  Small amount of bleeding noted from cervical os.  No CMT.  No adnexal mass or tenderness noted bilaterally. Musculoskeletal:        General: Normal range of motion.     Cervical back: Full passive range of motion without pain.  Skin:    General: Skin is warm and dry.     Capillary Refill: Capillary refill takes less than 2 seconds.  Neurological:     Mental Status: She is alert and oriented to person, place, and time.  Psychiatric:        Speech: Speech normal.     ED Results / Procedures / Treatments   Labs (all labs ordered are listed, but only abnormal results are displayed) Labs Reviewed  WET PREP, GENITAL - Abnormal; Notable for the following components:      Result Value   WBC, Wet Prep HPF POC FEW (*)    All other components within normal limits  COMPREHENSIVE METABOLIC PANEL - Abnormal; Notable for the following components:   Potassium 3.2 (*)    Glucose, Bld 113 (*)    Calcium 8.7 (*)     Albumin 3.1 (*)    All other components within normal limits  CBC - Abnormal; Notable for the following components:   RBC 3.65 (*)    Hemoglobin 10.3 (*)    HCT 32.4 (*)    All other components within normal limits  LIPASE, BLOOD  URINALYSIS, ROUTINE W REFLEX MICROSCOPIC  I-STAT BETA HCG BLOOD, ED ([facility], [facility], [facility] ONLY)  GC/CHLAMYDIA PROBE AMP ([place]) NOT AT [facility]    EKG None  Radiology No results found.  Procedures Procedures (including critical care time)  Medications Ordered in ED Medications  sodium chloride flush (NS) 0.9 % injection 3 mL (has no administration in time range)  potassium chloride SA (KLOR-CON) CR tablet 40 mEq (40 mEq Oral Given 04/06/20 0458)    ED Course  I have reviewed the triage vital signs and the nursing notes.  Pertinent labs & imaging results that were available during my care of the patient were reviewed by me and considered in my medical decision making (see chart for details).    MDM Rules/Calculators/A&P                          54 year old female who presents for evaluation of abnormal uterine bleeding.  She reports history of uterine fibroids and does have a history of heavy menstrual cycles.  She feels like this current menstrual cycle has been more heavier than normal.  She reports that it started about 3 days ago.  She reports that today, she has passed large clots and has saturated more than 2 pads an hour.  No fevers, chest pain, difficulty breathing.  On initially arrival, she is afebrile, nontoxic-appearing.  Vital signs are stable.  She is slightly hypertensive.  She does have a history of hypertension.  On exam, she has some mild suprapubic abdominal tenderness.  Labs ordered at triage.  Plan for pelvic exam.  I reviewed patient's record.  She was recently started on Xarelto for evidence of DVT.  CMP shows slight hypokalemia of 3.2.  BUN and creatinine within normal limits.  I-STAT beta negative.  Lipase unremarkable.  CBC shows  hemoglobin is 10.3.  She had a hemoglobin done in March 2021 which was 12.6 at that time.  Pelvic exam as documented above.  No evidence of hemorrhage.  She did have small amount of clot that was removed.  No CMT that would be concerning for PID.  No adnexal mass or tenderness noted bilaterally.  At this time, I suspect that this is abnormal uterine bleeding that is worsened by her use of Xarelto for treatment of DVT.  At this time, patient is hemodynamically stable.  Her hemoglobin is stable.  Patient with no tenderness noted on pelvic exam.  No indication for ultrasound imaging at this time.  Instructed patient to follow-up with her OB/GYN for further evaluation of her symptoms. At this time, patient exhibits no emergent life-threatening condition that require further evaluation in ED or admission. Patient had ample opportunity for questions and discussion. All patient's questions were answered with full understanding. Strict return precautions discussed. Patient expresses understanding and agreement to plan.   Portions of this note were generated with Lobbyist. Dictation errors may occur despite best attempts at proofreading.  Final Clinical Impression(s) / ED Diagnoses Final diagnoses:  Abnormal uterine bleeding (AUB)  Anemia, unspecified type  Hypokalemia    Rx / DC Orders ED Discharge Orders    None       Desma Mcgregor 04/06/20 0617    Fatima Blank, MD 04/06/20 [phone]

## 2020-04-06 NOTE — Discharge Instructions (Signed)
As we discussed, your hemoglobin was slightly low here.  Your vaginal bleeding is most likely worsened by Xarelto use.  Follow-up with Dr. Elly Modena.  Call her office arrange for an appointment.  Turn the emergency department for any worsening bleeding, worsening abdominal pain, vomiting, fever or any other worsening concerning symptoms.

## 2020-04-09 ENCOUNTER — Inpatient Hospital Stay: Payer: Medicaid Other | Attending: Oncology | Admitting: Oncology

## 2020-04-09 ENCOUNTER — Other Ambulatory Visit: Payer: Self-pay

## 2020-04-09 VITALS — BP 162/96 | HR 94 | Temp 98.1°F | Resp 20 | Ht 63.0 in | Wt 315.2 lb

## 2020-04-09 DIAGNOSIS — D649 Anemia, unspecified: Secondary | ICD-10-CM | POA: Insufficient documentation

## 2020-04-09 DIAGNOSIS — Z7901 Long term (current) use of anticoagulants: Secondary | ICD-10-CM | POA: Insufficient documentation

## 2020-04-09 DIAGNOSIS — I824Z2 Acute embolism and thrombosis of unspecified deep veins of left distal lower extremity: Secondary | ICD-10-CM

## 2020-04-09 DIAGNOSIS — I8290 Acute embolism and thrombosis of unspecified vein: Secondary | ICD-10-CM

## 2020-04-09 DIAGNOSIS — N939 Abnormal uterine and vaginal bleeding, unspecified: Secondary | ICD-10-CM | POA: Insufficient documentation

## 2020-04-09 NOTE — Progress Notes (Signed)
Reason for the request:    Deep vein thrombosis  HPI: I was asked by Dr. Juleen China to evaluate Carolyn Mcdonald for venous thromboembolism.  She is a 54 year old woman with history of hypertension and uterine fibroids who developed lower extremity edema in June 2021.  She has also noted pain in her calf for many weeks previous to that.  She was evaluated by her primary care physician and had a left more than right lower extremity swelling.  On March 11, 2020 she underwent vascular ultrasound which showed a left popliteal vein deep vein thrombosis with age undetermined.  She was started on Xarelto at that time.  She was seen in the emergency department on 9 April 06, 2020 for menorrhagia and worsening of her vaginal bleeding.  Hemoglobin at that time was 10.3 which was previously 12.6 in March 2021.  Clinically, she feels reasonably well although she continues to have abnormal bleeding with heavy menstrual cycles lasting 5 to 7 days.  In between her cycles or bleeding still exist but intermittent.  She has been started on Depo-Provera which has improved her bleeding.  She denies any chest pain or shortness of breath.  She denies any difficulty breathing.  She denies any personal or family history of venous thromboembolism.  She has had 3 pregnancies before including delivery via cesarean section without any complications.   She does not report any headaches, blurry vision, syncope or seizures. Does not report any fevers, chills or sweats.  Does not report any cough, wheezing or hemoptysis.  Does not report any chest pain, palpitation, orthopnea or leg edema.  Does not report any nausea, vomiting or abdominal pain.  Does not report any constipation or diarrhea.  Does not report any skeletal complaints.    Does not report frequency, urgency or hematuria.  Does not report any skin rashes or lesions. Does not report any heat or cold intolerance.  Does not report any lymphadenopathy or petechiae.  Does not report any  anxiety or depression.  Remaining review of systems is negative.    Past Medical History:  Diagnosis Date  . Fibroid   . Hypertension   :  Past Surgical History:  Procedure Laterality Date  . ANKLE SURGERY     R ankle   . CESAREAN SECTION     x3  . TOE SURGERY     R Great toe   . TUBAL LIGATION  1993  :   Current Outpatient Medications:  .  hydrochlorothiazide (HYDRODIURIL) 25 MG tablet, Take 1 tablet (25 mg total) by mouth daily., Disp: 90 tablet, Rfl: 3 .  Multiple Vitamin (MULTIVITAMIN) tablet, Take 1 tablet by mouth daily., Disp: , Rfl:  .  rivaroxaban (XARELTO) 20 MG TABS tablet, Take 1 tablet (20 mg total) by mouth daily with supper., Disp: 90 tablet, Rfl: 1:  Allergies  Allergen Reactions  . Vicodin [Hydrocodone-Acetaminophen] Hives  :  No family history on file.:  Social History   Socioeconomic History  . Marital status: Married    Spouse name: Not on file  . Number of children: Not on file  . Years of education: Not on file  . Highest education level: Not on file  Occupational History  . Not on file  Tobacco Use  . Smoking status: Never Smoker  . Smokeless tobacco: Never Used  Substance and Sexual Activity  . Alcohol use: Never  . Drug use: Never  . Sexual activity: Not Currently    Birth control/protection: Surgical  Other Topics Concern  .  Not on file  Social History Narrative  . Not on file   Social Determinants of Health   Financial Resource Strain:   . Difficulty of Paying Living Expenses:   Food Insecurity:   . Worried About Charity fundraiser in the Last Year:   . Arboriculturist in the Last Year:   Transportation Needs:   . Film/video editor (Medical):   Marland Kitchen Lack of Transportation (Non-Medical):   Physical Activity:   . Days of Exercise per Week:   . Minutes of Exercise per Session:   Stress:   . Feeling of Stress :   Social Connections:   . Frequency of Communication with Friends and Family:   . Frequency of Social  Gatherings with Friends and Family:   . Attends Religious Services:   . Active Member of Clubs or Organizations:   . Attends Archivist Meetings:   Marland Kitchen Marital Status:   Intimate Partner Violence:   . Fear of Current or Ex-Partner:   . Emotionally Abused:   Marland Kitchen Physically Abused:   . Sexually Abused:   :  Pertinent items are noted in HPI.  Exam: Blood pressure (!) 162/96, pulse 94, temperature 98.1 F (36.7 C), temperature source Temporal, resp. rate 20, height 5\' 3"  (1.6 m), weight (!) 315 lb 3.2 oz (143 kg), last menstrual period 04/02/2020, SpO2 100 %.  ECOG 1  General appearance: alert and cooperative appeared without distress. Head: atraumatic without any abnormalities. Eyes: conjunctivae/corneas clear. PERRL.  Sclera anicteric. Throat: lips, mucosa, and tongue normal; without oral thrush or ulcers. Resp: clear to auscultation bilaterally without rhonchi, wheezes or dullness to percussion. Cardio: regular rate and rhythm, S1, S2 normal, no murmur, click, rub or gallop GI: soft, non-tender; bowel sounds normal; no masses,  no organomegaly Skin: Skin color, texture, turgor normal. No rashes or lesions Lymph nodes: Cervical, supraclavicular, and axillary nodes normal. Neurologic: Grossly normal without any motor, sensory or deep tendon reflexes. Musculoskeletal: No joint deformity or effusion.    VAS [place] LOWER EXTREMITY VENOUS (DVT)  Result Date: 03/11/2020  Lower Venous DVT Study Indications: Bilateral lower extremity edema., with left calf tenderness. Patient states she started a new blood pressure medicine, amlodipine that caused her to have increase swelling after about 2 weeks of taking the medication. She states about one week after the initial swelling she felt a knot on the posterior upper calf and was there for about 2 weeks. The knot is no longer present. Patient denies SOB.  Risk Factors: None identified. Comparison Study: NA Performing Technologist: Sharlett Iles RVT  Examination Guidelines: A complete evaluation includes B-mode imaging, spectral Doppler, color Doppler, and power Doppler as needed of all accessible portions of each vessel. Bilateral testing is considered an integral part of a complete examination. Limited examinations for reoccurring indications may be performed as noted. The reflux portion of the exam is performed with the patient in reverse Trendelenburg.  +---------+---------------+---------+-----------+----------+--------------+ RIGHT    CompressibilityPhasicitySpontaneityPropertiesThrombus Aging +---------+---------------+---------+-----------+----------+--------------+ CFV      Full           Yes      Yes                                 +---------+---------------+---------+-----------+----------+--------------+ SFJ      Full           Yes      Yes                                 +---------+---------------+---------+-----------+----------+--------------+  FV Prox  Full           Yes      Yes                                 +---------+---------------+---------+-----------+----------+--------------+ FV Mid   Full                                                        +---------+---------------+---------+-----------+----------+--------------+ FV DistalFull           Yes      Yes                                 +---------+---------------+---------+-----------+----------+--------------+ PFV      Full           Yes      Yes                                 +---------+---------------+---------+-----------+----------+--------------+ POP      Full           Yes      Yes                                 +---------+---------------+---------+-----------+----------+--------------+ PTV      Full                    No                                  +---------+---------------+---------+-----------+----------+--------------+ PERO     Full                    No                                   +---------+---------------+---------+-----------+----------+--------------+ Gastroc  Full                                                        +---------+---------------+---------+-----------+----------+--------------+ GSV      Full           Yes      Yes                                 +---------+---------------+---------+-----------+----------+--------------+  Right Technical Findings: Limited visualization of the calf veins due to increase lower leg swelling. Specifically, the tibioperoneal confluence and popliteal veins are without thrombus.  +---------+---------------+---------+-----------+------------+-----------------+ LEFT     CompressibilityPhasicitySpontaneityProperties  Thrombus Aging    +---------+---------------+---------+-----------+------------+-----------------+ CFV      Full           Yes      Yes                                      +---------+---------------+---------+-----------+------------+-----------------+  SFJ      Full           Yes      Yes                                      +---------+---------------+---------+-----------+------------+-----------------+ FV Prox  Full           Yes      Yes                                      +---------+---------------+---------+-----------+------------+-----------------+ FV Mid   Full                                                             +---------+---------------+---------+-----------+------------+-----------------+ FV DistalFull           Yes      Yes                                      +---------+---------------+---------+-----------+------------+-----------------+ PFV      Full           Yes      Yes                                      +---------+---------------+---------+-----------+------------+-----------------+ POP      Partial        Yes      Yes        softly and  Age Indeterminate                                             brightly                                                                   echogenic                     +---------+---------------+---------+-----------+------------+-----------------+ PTV      Full           Yes      Yes                                      +---------+---------------+---------+-----------+------------+-----------------+ PERO     Full           Yes      Yes                                      +---------+---------------+---------+-----------+------------+-----------------+ Gastroc  Full                                                             +---------+---------------+---------+-----------+------------+-----------------+  GSV      Full           Yes      Yes                                      +---------+---------------+---------+-----------+------------+-----------------+   Left Technical Findings: Limited visualization of the calf veins due to increase lower leg swelling.  Findings reported to Dr Phill Myron at 12:27 pm.  Summary: RIGHT: - No evidence of deep vein thrombosis in the lower extremity. No indirect evidence of obstruction proximal to the inguinal ligament. - No cystic structure found in the popliteal fossa.  LEFT: - Findings consistent with age indeterminate deep vein thrombosis involving the left popliteal vein. - No cystic structure found in the popliteal fossa.  *See table(s) above for measurements and observations. Electronically signed by Quay Burow MD on 03/11/2020 at 5:02:05 PM.    Final     Assessment and Plan:   54 year old woman with:  1.  Left lower extremity deep vein thrombosis diagnosed on March 11, 2020.  He presented with bilateral lower extremity edema with left more than the right.  Risk factors including obesity and immobility because of prolonged riding in a car as a part of her job.  The natural course of venous thromboembolism and deep vein thrombosis were discussed.  Treatment options were also reviewed at this time.  It is unclear whether  this thrombosis was provoked.  The duration of anticoagulation were discussed which includes 3 to 6 months of anticoagulation versus longer period of time.  Risks and benefits form for anticoagulation were discussed.  Given the fact that this is her first thrombosis with age undetermined, I  recommended 3 months of anticoagulation at this time given the fact that her blood clot was provoked by immobility.  She is currently on Xarelto and I recommend discontinuation after October 1.  I have no objections to undergoing a hysterectomy while she is on Xarelto.  She will have to stop it 2 days before postoperatively.  2.  Abnormal uterine bleeding: She has been seen by her gynecologist in the past and underwent endometrial biopsy.  She has been medically managed with Megace and Depo-Provera.  She is under consideration for hysterectomy which I do not have any objections to.  3.  Anemia: Likely related to chronic menstrual blood losses.  Recommended iron check and replacement in the future as needed.  4.  Follow-up: Happy to see her in the future as needed.   45  minutes were dedicated to this visit. The time was spent on reviewing laboratory data, imaging studies, discussing treatment options, and answering questions regarding future plan.     A copy of this consult has been forwarded to the requesting physician.

## 2020-04-10 ENCOUNTER — Telehealth: Payer: Self-pay

## 2020-04-10 NOTE — Telephone Encounter (Signed)
Called patient to do their pre-visit COVID screening.  Call went to voicemail, unable to do prescreening.

## 2020-04-13 ENCOUNTER — Encounter: Payer: Self-pay | Admitting: Internal Medicine

## 2020-04-13 ENCOUNTER — Other Ambulatory Visit: Payer: Self-pay

## 2020-04-13 ENCOUNTER — Ambulatory Visit (INDEPENDENT_AMBULATORY_CARE_PROVIDER_SITE_OTHER): Payer: Medicaid Other | Admitting: Internal Medicine

## 2020-04-13 ENCOUNTER — Other Ambulatory Visit: Payer: Self-pay | Admitting: Internal Medicine

## 2020-04-13 VITALS — BP 144/96 | HR 97 | Temp 97.3°F | Resp 18 | Wt 313.0 lb

## 2020-04-13 DIAGNOSIS — R7303 Prediabetes: Secondary | ICD-10-CM

## 2020-04-13 DIAGNOSIS — I10 Essential (primary) hypertension: Secondary | ICD-10-CM | POA: Diagnosis not present

## 2020-04-13 DIAGNOSIS — E559 Vitamin D deficiency, unspecified: Secondary | ICD-10-CM | POA: Diagnosis not present

## 2020-04-13 MED ORDER — LOSARTAN POTASSIUM 50 MG PO TABS: 50.0000 mg | ORAL_TABLET | Freq: Every day | ORAL | 1 refills | Status: DC

## 2020-04-13 NOTE — Progress Notes (Signed)
Subjective:    Carolyn Mcdonald - 54 y.o. female MRN [id number]  Date of birth: [birth year]  HPI  Carolyn Mcdonald is here for follow up on HTN. At last visit, HCTZ was increased from 12.5 to 25 mg. BP measurements at home have been in the 130-140s/70-80s. Has decreased sine we increased the dose. No chest pain, headaches, vision changes. Reports compliance with medication. Patient has been on Amlodipine in recent past but this was discontinued due to leg edema. However, venous ultrasound was also obtained and she was found to have left DVT. She does report that she does not eat any processed or canned foods. Checks dietary labels for Na content. Walking 2 days per week.    Health Maintenance:  Health Maintenance Due  Topic Date Due  . COVID-19 Vaccine (1) Never done  . INFLUENZA VACCINE  04/12/2020    -  reports that she has never smoked. She has never used smokeless tobacco. - Review of Systems: Per HPI. - Past Medical History: Patient Active Problem List   Diagnosis Date Noted  . Prediabetes 01/31/2020  . Vitamin D deficiency 01/31/2020  . Essential hypertension 01/30/2020  . Fibroid 01/30/2020  . Morbid obesity ([place]) 01/30/2020  . Nutritional counseling 01/20/2020   - Medications: reviewed and updated   Objective:   Physical Exam BP (!) 144/96   Pulse 97   Temp (!) 97.3 F (36.3 C) (Temporal)   Resp 18   Wt (!) 313 lb (142 kg)   LMP 04/02/2020   SpO2 98%   BMI 55.45 kg/m  Physical Exam Constitutional:      General: She is not in acute distress.    Appearance: She is not diaphoretic.  HENT:     Head: Normocephalic and atraumatic.  Eyes:     Conjunctiva/sclera: Conjunctivae normal.  Cardiovascular:     Rate and Rhythm: Normal rate and regular rhythm.     Heart sounds: Normal heart sounds. No murmur heard.   Pulmonary:     Effort: Pulmonary effort is normal. No respiratory distress.     Breath sounds: Normal breath sounds.  Musculoskeletal:        General:  Normal range of motion.  Skin:    General: Skin is warm and dry.  Neurological:     Mental Status: She is alert and oriented to person, place, and time.  Psychiatric:        Mood and Affect: Affect normal.        Judgment: Judgment normal.      Assessment & Plan:    1. Essential hypertension BP remains above goal. Will add Losartan 50 mg to regimen. Will also provide some renal protection with h/o prediabetes. Continue HCTZ. Return in 2 weeks for CMA BP check and lab monitoring.  Counseled on blood pressure goal of less than 130/80, low-sodium, DASH diet, medication compliance, 150 minutes of moderate intensity exercise per week. Discussed medication compliance, adverse effects. - losartan (COZAAR) 50 MG tablet; Take 1 tablet (50 mg total) by mouth daily.  Dispense: 90 tablet; Refill: 1 - Basic metabolic panel; Future  2. Prediabetes Discussed that prior lab results showed prediabetes with A1c 5.8. Discussed carb modified diet and exercise regimen to prevent progression to DM.   3. Vitamin D deficiency Vit D was low at 22. Recommended 2000 IU daily OTC supplement. Discussed Vit D is found in sunshine and that darker pigmented skin tones have harder time absorbing Vit D. Discussed diet choices that would contain Vit D. Will  plan to re-screen after 3 months of therapy.    Phill Myron, D.O. 04/13/2020, 4:02 PM Primary Care at [facility]

## 2020-04-13 NOTE — Patient Instructions (Addendum)
I would recommend a Vit D 2000 IU over the counter supplement for your low Vitamin D levels.     DASH Eating Plan DASH stands for "Dietary Approaches to Stop Hypertension." The DASH eating plan is a healthy eating plan that has been shown to reduce high blood pressure (hypertension). It may also reduce your risk for type 2 diabetes, heart disease, and stroke. The DASH eating plan may also help with weight loss. What are tips for following this plan?  General guidelines  Avoid eating more than 2,300 mg (milligrams) of salt (sodium) a day. If you have hypertension, you may need to reduce your sodium intake to 1,500 mg a day.  Limit alcohol intake to no more than 1 drink a day for nonpregnant women and 2 drinks a day for men. One drink equals 12 oz of beer, 5 oz of wine, or 1 oz of hard liquor.  Work with your health care provider to maintain a healthy body weight or to lose weight. Ask what an ideal weight is for you.  Get at least 30 minutes of exercise that causes your heart to beat faster (aerobic exercise) most days of the week. Activities may include walking, swimming, or biking.  Work with your health care provider or diet and nutrition specialist (dietitian) to adjust your eating plan to your individual calorie needs. Reading food labels   Check food labels for the amount of sodium per serving. Choose foods with less than 5 percent of the Daily Value of sodium. Generally, foods with less than 300 mg of sodium per serving fit into this eating plan.  To find whole grains, look for the word "whole" as the first word in the ingredient list. Shopping  Buy products labeled as "low-sodium" or "no salt added."  Buy fresh foods. Avoid canned foods and premade or frozen meals. Cooking  Avoid adding salt when cooking. Use salt-free seasonings or herbs instead of table salt or sea salt. Check with your health care provider or pharmacist before using salt substitutes.  Do not fry foods.  Cook foods using healthy methods such as baking, boiling, grilling, and broiling instead.  Cook with heart-healthy oils, such as olive, canola, soybean, or sunflower oil. Meal planning  Eat a balanced diet that includes: ? 5 or more servings of fruits and vegetables each day. At each meal, try to fill half of your plate with fruits and vegetables. ? Up to 6-8 servings of whole grains each day. ? Less than 6 oz of lean meat, poultry, or fish each day. A 3-oz serving of meat is about the same size as a deck of cards. One egg equals 1 oz. ? 2 servings of low-fat dairy each day. ? A serving of nuts, seeds, or beans 5 times each week. ? Heart-healthy fats. Healthy fats called Omega-3 fatty acids are found in foods such as flaxseeds and coldwater fish, like sardines, salmon, and mackerel.  Limit how much you eat of the following: ? Canned or prepackaged foods. ? Food that is high in trans fat, such as fried foods. ? Food that is high in saturated fat, such as fatty meat. ? Sweets, desserts, sugary drinks, and other foods with added sugar. ? Full-fat dairy products.  Do not salt foods before eating.  Try to eat at least 2 vegetarian meals each week.  Eat more home-cooked food and less restaurant, buffet, and fast food.  When eating at a restaurant, ask that your food be prepared with less salt or  no salt, if possible. What foods are recommended? The items listed may not be a complete list. Talk with your dietitian about what dietary choices are best for you. Grains Whole-grain or whole-wheat bread. Whole-grain or whole-wheat pasta. Brown rice. Modena Morrow. Bulgur. Whole-grain and low-sodium cereals. Pita bread. Low-fat, low-sodium crackers. Whole-wheat flour tortillas. Vegetables Fresh or frozen vegetables (raw, steamed, roasted, or grilled). Low-sodium or reduced-sodium tomato and vegetable juice. Low-sodium or reduced-sodium tomato sauce and tomato paste. Low-sodium or reduced-sodium  canned vegetables. Fruits All fresh, dried, or frozen fruit. Canned fruit in natural juice (without added sugar). Meat and other protein foods Skinless chicken or [place]. Ground chicken or [place]. Pork with fat trimmed off. Fish and seafood. Egg whites. Dried beans, peas, or lentils. Unsalted nuts, nut butters, and seeds. Unsalted canned beans. Lean cuts of beef with fat trimmed off. Low-sodium, lean deli meat. Dairy Low-fat (1%) or fat-free (skim) milk. Fat-free, low-fat, or reduced-fat cheeses. Nonfat, low-sodium ricotta or cottage cheese. Low-fat or nonfat yogurt. Low-fat, low-sodium cheese. Fats and oils Soft margarine without trans fats. Vegetable oil. Low-fat, reduced-fat, or light mayonnaise and salad dressings (reduced-sodium). Canola, safflower, olive, soybean, and sunflower oils. Avocado. Seasoning and other foods Herbs. Spices. Seasoning mixes without salt. Unsalted popcorn and pretzels. Fat-free sweets. What foods are not recommended? The items listed may not be a complete list. Talk with your dietitian about what dietary choices are best for you. Grains Baked goods made with fat, such as croissants, muffins, or some breads. Dry pasta or rice meal packs. Vegetables Creamed or fried vegetables. Vegetables in a cheese sauce. Regular canned vegetables (not low-sodium or reduced-sodium). Regular canned tomato sauce and paste (not low-sodium or reduced-sodium). Regular tomato and vegetable juice (not low-sodium or reduced-sodium). Angie Fava. Olives. Fruits Canned fruit in a light or heavy syrup. Fried fruit. Fruit in cream or butter sauce. Meat and other protein foods Fatty cuts of meat. Ribs. Fried meat. Berniece Salines. Sausage. Bologna and other processed lunch meats. Salami. Fatback. Hotdogs. Bratwurst. Salted nuts and seeds. Canned beans with added salt. Canned or smoked fish. Whole eggs or egg yolks. Chicken or [place] with skin. Dairy Whole or 2% milk, cream, and half-and-half. Whole or  full-fat cream cheese. Whole-fat or sweetened yogurt. Full-fat cheese. Nondairy creamers. Whipped toppings. Processed cheese and cheese spreads. Fats and oils Butter. Stick margarine. Lard. Shortening. Ghee. Bacon fat. Tropical oils, such as coconut, palm kernel, or palm oil. Seasoning and other foods Salted popcorn and pretzels. Onion salt, garlic salt, seasoned salt, table salt, and sea salt. Worcestershire sauce. Tartar sauce. Barbecue sauce. Teriyaki sauce. Soy sauce, including reduced-sodium. Steak sauce. Canned and packaged gravies. Fish sauce. Oyster sauce. Cocktail sauce. Horseradish that you find on the shelf. Ketchup. Mustard. Meat flavorings and tenderizers. Bouillon cubes. Hot sauce and Tabasco sauce. Premade or packaged marinades. Premade or packaged taco seasonings. Relishes. Regular salad dressings. Where to find more information:  National Heart, Lung, and [place]: https://wilson-eaton.com/  American Heart Association: www.heart.org Summary  The DASH eating plan is a healthy eating plan that has been shown to reduce high blood pressure (hypertension). It may also reduce your risk for type 2 diabetes, heart disease, and stroke.  With the DASH eating plan, you should limit salt (sodium) intake to 2,300 mg a day. If you have hypertension, you may need to reduce your sodium intake to 1,500 mg a day.  When on the DASH eating plan, aim to eat more fresh fruits and vegetables, whole grains, lean proteins, low-fat dairy, and  heart-healthy fats.  Work with your health care provider or diet and nutrition specialist (dietitian) to adjust your eating plan to your individual calorie needs. This information is not intended to replace advice given to you by your health care provider. Make sure you discuss any questions you have with your health care provider. Document Revised: 08/11/2017 Document Reviewed: 08/22/2016 Elsevier Patient Education  2020 [employer].

## 2020-04-27 ENCOUNTER — Other Ambulatory Visit: Payer: Self-pay

## 2020-04-27 ENCOUNTER — Ambulatory Visit: Payer: Medicaid Other

## 2020-04-27 ENCOUNTER — Ambulatory Visit: Payer: Medicaid Other | Admitting: Obstetrics and Gynecology

## 2020-04-27 VITALS — BP 135/91 | HR 103

## 2020-04-27 DIAGNOSIS — I10 Essential (primary) hypertension: Secondary | ICD-10-CM

## 2020-04-27 NOTE — Progress Notes (Signed)
Patient here for BP check. States that home readings have been in the 120s-130s/70s-80s. Denies chest pain, SHOB, headaches, lower extremity swelling, palpitations, dizziness. Has taken BP medication today. After sitting BP was 135/91, pulse 103. KWalker, CMA.

## 2020-04-28 LAB — BASIC METABOLIC PANEL
BUN/Creatinine Ratio: 17 (ref 9–23)
BUN: 13 mg/dL (ref 6–24)
CO2: 24 mmol/L (ref 20–29)
Calcium: 9.1 mg/dL (ref 8.7–10.2)
Chloride: 102 mmol/L (ref 96–106)
Creatinine, Ser: 0.76 mg/dL (ref 0.57–1.00)
GFR calc Af Amer: 102 mL/min/{1.73_m2} (ref >59)
GFR calc non Af Amer: 89 mL/min/{1.73_m2} (ref >59)
Glucose: 88 mg/dL (ref 65–99)
Potassium: 3.7 mmol/L (ref 3.5–5.2)
Sodium: 140 mmol/L (ref 134–144)

## 2020-05-11 ENCOUNTER — Ambulatory Visit (INDEPENDENT_AMBULATORY_CARE_PROVIDER_SITE_OTHER): Payer: Medicaid Other | Admitting: Obstetrics and Gynecology

## 2020-05-11 ENCOUNTER — Other Ambulatory Visit: Payer: Self-pay

## 2020-05-11 ENCOUNTER — Encounter: Payer: Self-pay | Admitting: Obstetrics and Gynecology

## 2020-05-11 VITALS — BP 136/86 | HR 112 | Wt 318.0 lb

## 2020-05-11 DIAGNOSIS — N939 Abnormal uterine and vaginal bleeding, unspecified: Secondary | ICD-10-CM

## 2020-05-11 MED ORDER — FERROUS SULFATE 325 (65 FE) MG PO TABS: 325.0000 mg | ORAL_TABLET | Freq: Two times a day (BID) | ORAL | 1 refills | Status: DC

## 2020-05-11 MED ORDER — MEDROXYPROGESTERONE ACETATE 10 MG PO TABS: 20.0000 mg | ORAL_TABLET | Freq: Every day | ORAL | 2 refills | Status: DC

## 2020-05-11 NOTE — Progress Notes (Signed)
54 yo here for follow up on AUB. Patient previously seen in April with the same complaints. She reports periods continue to occur every moth with the first few days being very heavy with passage of clots. Patient reports decrease in energy and fatigue. She is completing a course of xarelto for a DVT in September. Patient presents today ready for surgical management of her AUB with a hysterectomy  Past Medical History:  Diagnosis Date  . Fibroid   . Hypertension    Past Surgical History:  Procedure Laterality Date  . ANKLE SURGERY     R ankle   . CESAREAN SECTION     x3  . TOE SURGERY     R Great toe   . TUBAL LIGATION  1993   No family history on file. Social History   Tobacco Use  . Smoking status: Never Smoker  . Smokeless tobacco: Never Used  Substance Use Topics  . Alcohol use: Never  . Drug use: Never   ROS See pertinent in HPI  Blood pressure 136/86, pulse (!) 112, weight (!) 318 lb (144.2 kg). GENERAL: Well-developed, well-nourished female in no acute distress.  ABDOMEN: Soft, nontender, nondistended. No organomegaly. EXTREMITIES: No cyanosis, clubbing, or edema, 2+ distal pulses.  12/2019 EmBx- negative for malignancy  12/2019 ultrasound FINDINGS: Uterus  Measurements: 13.5 x 6.4 x 6.5 cm = volume: 290 mL. Mass identified at anterior uterine fundus, transmural, 5.1 x 4.6 x 5.1 cm likely representing a leiomyoma. Heterogeneous myometrium without additional masses.  Endometrium  Thickness: 9 mm.  No definite endometrial fluid or focal abnormality  Right ovary  Not visualized, likely obscured by bowel  Left ovary  Not visualized, likely obscured by bowel  Other findings  No free pelvic fluid.  No adnexal masses.  IMPRESSION: Large leiomyoma at anterior upper uterine segment 5 0.1 cm diameter, extends submucosal.  Nonvisualization of ovaries.   Electronically Signed   By: Lavonia Dana M.D.   On: 12/19/2019 10:05  A/P 54 yo with  AUB and fibroid uterus on xarelto - Reviewed management options with the patient and she desires hysterectomy. Risks, benefits and alternatives were explained including but not limited to risks of bleeding, infection and damage to adjacent organs. Patient verbalized understanding and all questions were answered. Patient scheduled for abdominal hysterectomy with bilateral salpingectomy - Rx provera provided for medical management of AUB until surgery - Rx iron supplement provided - cbc today - patient will be contacted with results

## 2020-05-11 NOTE — Progress Notes (Signed)
GYN presents for FU AUB. Bleeding 1 week ago.

## 2020-05-12 LAB — CBC
Hematocrit: 32 % — ABNORMAL LOW (ref 34.0–46.6)
Hemoglobin: 10.3 g/dL — ABNORMAL LOW (ref 11.1–15.9)
MCH: 27.3 pg (ref 26.6–33.0)
MCHC: 32.2 g/dL (ref 31.5–35.7)
MCV: 85 fL (ref 79–97)
Platelets: 342 10*3/uL (ref 150–450)
RBC: 3.77 x10E6/uL (ref 3.77–5.28)
RDW: 13.3 % (ref 11.7–15.4)
WBC: 7.2 10*3/uL (ref 3.4–10.8)

## 2020-06-15 MED FILL — HYDROCHLOROTHIAZIDE 25 MG T: 25 | 90 days supply | Qty: 90 | Fill #1

## 2020-06-30 MED FILL — LOSARTAN POTASSIUM 50 MG TA: 50 | 90 days supply | Qty: 90 | Fill #0

## 2020-06-30 MED FILL — XARELTO 20 MG TABLET: 20 | 90 days supply | Qty: 90 | Fill #1

## 2020-07-14 NOTE — Pre-Procedure Instructions (Signed)
[place], [place] [address] [place] [address] Phone: [phone] Fax: [phone]     Your procedure is scheduled on Tuesday, November 9, from 07:30 AM- 09:19 AM.  Report to [facility]tr[facility] "A" at 05:30 A.M., and check in at the Admitting office.  Call this number if you have problems the morning of surgery:  [phone]  Call [phone] if you have any questions prior to your surgery date Monday-Friday 8am-4pm.    Remember:  Do not eat after midnight the night before your surgery.  You may drink clear liquids until 04:30 AM the morning of your surgery.   Clear liquids allowed are: Water, Non-Citrus Juices (without pulp), Carbonated Beverages, Clear Tea, Black Coffee Only, and Gatorade.    Take these medicines the morning of surgery with A SIP OF WATER:  medroxyPROGESTERone (PROVERA)  *Follow your surgeon's instructions on when to stop rivaroxaban (XARELTO).  If no instructions were given by your surgeon then you will need to call the office to get those instructions.     As of today, STOP taking any Aspirin (unless otherwise instructed by your surgeon) Aleve, Naproxen, Ibuprofen, Motrin, Advil, Goody's, BC's, all herbal medications, fish oil, and all vitamins.   The Morning of Surgery:                    Do not wear jewelry, make up, or nail polish.            Do not wear lotions, powders, perfumes, or deodorant.            Do not shave 48 hours prior to surgery.              Do not bring valuables to the hospital.            [facility] is not responsible for any belongings or valuables.  Do NOT Smoke (Tobacco/Vaping) or drink Alcohol 24 hours prior to your procedure.  If you use a CPAP at night, you may bring all equipment for your overnight stay.   Contacts, glasses, dentures or bridgework may not be worn into surgery.      For patients admitted to the hospital, discharge time will be  determined by your treatment team.   Patients discharged the day of surgery will not be allowed to drive home, and someone needs to stay with them for 24 hours.    Special instructions:   [facility]- Preparing For Surgery  Before surgery, you can play an important role. Because skin is not sterile, your skin needs to be as free of germs as possible. You can reduce the number of germs on your skin by washing with CHG (chlorahexidine gluconate) Soap before surgery.  CHG is an antiseptic cleaner which kills germs and bonds with the skin to continue killing germs even after washing.    Oral Hygiene is also important to reduce your risk of infection.  Remember - BRUSH YOUR TEETH THE MORNING OF SURGERY WITH YOUR REGULAR TOOTHPASTE  Please do not use if you have an allergy to CHG or antibacterial soaps. If your skin becomes reddened/irritated stop using the CHG.  Do not shave (including legs and underarms) for at least 48 hours prior to first CHG shower. It is OK to shave your face.  Please follow these instructions carefully.   1. Shower the NIGHT BEFORE SURGERY and the MORNING OF SURGERY with CHG Soap.   2.  If you chose to wash your hair, wash your hair first as usual with your normal shampoo.  3. After you shampoo, rinse your hair and body thoroughly to remove the shampoo.  4. Use CHG as you would any other liquid soap. You can apply CHG directly to the skin and wash gently with a scrungie or a clean washcloth.   5. Apply the CHG Soap to your body ONLY FROM THE NECK DOWN.  Do not use on open wounds or open sores. Avoid contact with your eyes, ears, mouth and genitals (private parts). Wash Face and genitals (private parts)  with your normal soap.   6. Wash thoroughly, paying special attention to the area where your surgery will be performed.  7. Thoroughly rinse your body with warm water from the neck down.  8. DO NOT shower/wash with your normal soap after using and rinsing off the CHG  Soap.  9. Pat yourself dry with a CLEAN TOWEL.  10. Wear CLEAN PAJAMAS to bed the night before surgery  11. Place CLEAN SHEETS on your bed the night of your first shower and DO NOT SLEEP WITH PETS.   Day of Surgery: SHOWER Wear Clean/Comfortable clothing the morning of surgery Do not apply any deodorants/lotions.   Remember to brush your teeth WITH YOUR REGULAR TOOTHPASTE.   Please read over the following fact sheets that you were given.

## 2020-07-15 ENCOUNTER — Telehealth: Payer: Self-pay

## 2020-07-15 ENCOUNTER — Other Ambulatory Visit: Payer: Self-pay

## 2020-07-15 ENCOUNTER — Encounter (HOSPITAL_COMMUNITY): Payer: Self-pay

## 2020-07-15 ENCOUNTER — Encounter (HOSPITAL_COMMUNITY)
Admission: RE | Admit: 2020-07-15 | Discharge: 2020-07-15 | Disposition: A | Discharge status: Home / Self Care | Payer: Medicaid Other | Source: Ambulatory Visit | Attending: Obstetrics and Gynecology | Admitting: Obstetrics and Gynecology

## 2020-07-15 DIAGNOSIS — Z01812 Encounter for preprocedural laboratory examination: Secondary | ICD-10-CM | POA: Diagnosis present

## 2020-07-15 LAB — CBC
HCT: 34 % — ABNORMAL LOW (ref 36.0–46.0)
Hemoglobin: 10.2 g/dL — ABNORMAL LOW (ref 12.0–15.0)
MCH: 25.4 pg — ABNORMAL LOW (ref 26.0–34.0)
MCHC: 30 g/dL (ref 30.0–36.0)
MCV: 84.6 fL (ref 80.0–100.0)
Platelets: 292 10*3/uL (ref 150–400)
RBC: 4.02 MIL/uL (ref 3.87–5.11)
RDW: 15.9 % — ABNORMAL HIGH (ref 11.5–15.5)
WBC: 5.1 10*3/uL (ref 4.0–10.5)
nRBC: 0 % (ref 0.0–0.2)

## 2020-07-15 LAB — TYPE AND SCREEN
ABO/RH(D): B POS
Antibody Screen: NEGATIVE

## 2020-07-15 LAB — BASIC METABOLIC PANEL
Anion gap: 9 (ref 5–15)
BUN: 14 mg/dL (ref 6–20)
CO2: 27 mmol/L (ref 22–32)
Calcium: 9.2 mg/dL (ref 8.9–10.3)
Chloride: 103 mmol/L (ref 98–111)
Creatinine, Ser: 0.81 mg/dL (ref 0.44–1.00)
GFR, Estimated: >60 mL/min (ref >60)
Glucose, Bld: 98 mg/dL (ref 70–99)
Potassium: 3.2 mmol/L — ABNORMAL LOW (ref 3.5–5.1)
Sodium: 139 mmol/L (ref 135–145)

## 2020-07-15 NOTE — Telephone Encounter (Signed)
Return call to pt regarding triage message about when to stop XareltoRx before surgery  Pt not ava lvm  Will send pt a Mychart message of Dr.Constants response to stop Rx the day before surgery however pt has appt on 07/18/20 she will be notified of this.

## 2020-07-15 NOTE — Progress Notes (Signed)
PCP - Dr. Phill Myron on [facility] Cardiologist - Denies  Chest x-ray - 09/02/19 EKG - 09/02/19 Stress Test - Denies ECHO - Denies Cardiac Cath - Denies  Sleep Study - No  DM-Denies  Blood Thinner Instructions: Xarelto Requesting patient to contact Dr. Elly Modena regarding when to stop.   ERAS Protcol - Yes instructions given  COVID TEST- 07/18/20  Anesthesia review: No  Patient denies shortness of breath, fever, cough and chest pain at PAT appointment   All instructions explained to the patient, with a verbal understanding of the material. Patient agrees to go over the instructions while at home for a better understanding. Patient also instructed to self quarantine after being tested for COVID-19. The opportunity to ask questions was provided.

## 2020-07-18 ENCOUNTER — Other Ambulatory Visit (HOSPITAL_COMMUNITY)
Admission: RE | Admit: 2020-07-18 | Discharge: 2020-07-18 | Disposition: A | Discharge status: Home / Self Care | Payer: Medicaid Other | Source: Ambulatory Visit | Attending: Obstetrics and Gynecology | Admitting: Obstetrics and Gynecology

## 2020-07-18 DIAGNOSIS — Z20822 Contact with and (suspected) exposure to covid-19: Secondary | ICD-10-CM | POA: Insufficient documentation

## 2020-07-18 DIAGNOSIS — Z01818 Encounter for other preprocedural examination: Secondary | ICD-10-CM | POA: Insufficient documentation

## 2020-07-18 LAB — SARS CORONAVIRUS 2 (TAT 6-24 HRS): SARS Coronavirus 2: NEGATIVE

## 2020-07-20 MED ORDER — DEXTROSE 5 % IV SOLN
3.0000 g | INTRAVENOUS | Status: DC
  Filled 2020-07-20: qty 3000

## 2020-07-20 NOTE — H&P (Signed)
Carolyn Mcdonald is an 54 y.o. female P3 with BMI 56 here for scheduled hysterectomy. Patient with history of menorrhagia with failed medical management with megace and depo-provera. Patient also reports generalized fatigue and anemia. Patient recently treated for DVT. Patient is currently without any other complaints and denies chest pain, shortness of breath, lightheadedness/dizziness  Pertinent Gynecological History: Menses: flow is moderate and regular every month without intermenstrual spotting Contraception: tubal ligation DES exposure: denies Blood transfusions: none Last mammogram: normal Date: 02/2020 Last pap: normal Date: 12/2019   Menstrual History: Patient's last menstrual period was 05/13/2020.    Past Medical History:  Diagnosis Date  . Fibroid   . Hypertension     Past Surgical History:  Procedure Laterality Date  . ANKLE SURGERY     R ankle   . CESAREAN SECTION     x3  . TOE SURGERY     R Great toe   . TUBAL LIGATION  1993    History reviewed. No pertinent family history.  Social History:  reports that she has never smoked. She has never used smokeless tobacco. She reports that she does not drink alcohol and does not use drugs.  Allergies:  Allergies  Allergen Reactions  . Vicodin [Hydrocodone-Acetaminophen] Hives    Medications Prior to Admission  Medication Sig Dispense Refill Last Dose  . hydrochlorothiazide (HYDRODIURIL) 25 MG tablet Take 1 tablet (25 mg total) by mouth daily. 90 tablet 3 07/21/2020 at 0400  . losartan (COZAAR) 50 MG tablet Take 1 tablet (50 mg total) by mouth daily. 90 tablet 1 07/21/2020 at 0400  . Multiple Vitamin (MULTIVITAMIN) tablet Take 1 tablet by mouth daily.   Past Month at Unknown time  . rivaroxaban (XARELTO) 20 MG TABS tablet Take 1 tablet (20 mg total) by mouth daily with supper. 90 tablet 1 1900  . ferrous sulfate (FERROUSUL) 325 (65 FE) MG tablet Take 1 tablet (325 mg total) by mouth 2 (two) times daily. (Patient not  taking: Reported on 07/07/2020) 60 tablet 1 Not Taking at Unknown time  . medroxyPROGESTERone (PROVERA) 10 MG tablet Take 2 tablets (20 mg total) by mouth daily. (Patient taking differently: Take 20 mg by mouth daily as needed (during menstruation). ) 30 tablet 2 More than a month at Unknown time    Review of Systems See pertinent in HPI Blood pressure (!) 162/87, pulse (!) 114, temperature 98.7 F (37.1 C), temperature source Oral, resp. rate 20, last menstrual period 05/13/2020, SpO2 100 %. Physical Exam GENERAL: Well-developed, well-nourished female in no acute distress.  LUNGS: Clear to auscultation bilaterally.  HEART: Regular rate and rhythm. ABDOMEN: Soft, nontender, nondistended. No organomegaly. Obese PELVIC: Deferred to OR EXTREMITIES: No cyanosis, clubbing, or edema, 2+ distal pulses.  Results for orders placed or performed during the hospital encounter of 07/21/20 (from the past 24 hour(s))  Pregnancy, urine POC     Status: None   Collection Time: 07/21/20  6:48 AM  Result Value Ref Range   Preg Test, Ur NEGATIVE NEGATIVE  ABO/Rh     Status: None (Preliminary result)   Collection Time: 07/21/20  6:50 AM  Result Value Ref Range   ABO/RH(D) PENDING     No results found. 12/2019 ultrasound FINDINGS: Uterus  Measurements: 13.5 x 6.4 x 6.5 cm = volume: 290 mL. Mass identified at anterior uterine fundus, transmural, 5.1 x 4.6 x 5.1 cm likely representing a leiomyoma. Heterogeneous myometrium without additional masses.  Endometrium  Thickness: 9 mm.  No definite endometrial fluid or  focal abnormality  Right ovary  Not visualized, likely obscured by bowel  Left ovary  Not visualized, likely obscured by bowel  Other findings  No free pelvic fluid.  No adnexal masses.  IMPRESSION: Large leiomyoma at anterior upper uterine segment 5 0.1 cm diameter, extends submucosal.  Nonvisualization of ovaries.    Assessment/Plan: 54 yo with menorrhagia  and fibroid uterus here for definitive treatment with hysterectomy - Risks, benefits and alternatives were explained including but not limited to risks of bleeding, infection and damage to adjacent organs. Patient verbalized understanding and all questions were answered. Patient scheduled for abdominal hysterectomy with bilateral salpingectomy  Diara Chaudhari 07/21/2020, 7:32 AM

## 2020-07-21 ENCOUNTER — Inpatient Hospital Stay (HOSPITAL_COMMUNITY): Payer: Medicaid Other | Admitting: Anesthesiology

## 2020-07-21 ENCOUNTER — Encounter (HOSPITAL_COMMUNITY): Payer: Self-pay | Admitting: Obstetrics and Gynecology

## 2020-07-21 ENCOUNTER — Encounter (HOSPITAL_COMMUNITY)
Admission: RE | Disposition: A | Discharge status: Home / Self Care | Payer: Self-pay | Source: Home / Self Care | Attending: Obstetrics and Gynecology

## 2020-07-21 ENCOUNTER — Inpatient Hospital Stay (HOSPITAL_COMMUNITY)
Admission: RE | Admit: 2020-07-21 | Discharge: 2020-07-23 | DRG: 742 | Disposition: A | Discharge status: Home / Self Care | Payer: Medicaid Other | Attending: Obstetrics and Gynecology | Admitting: Obstetrics and Gynecology

## 2020-07-21 ENCOUNTER — Other Ambulatory Visit: Payer: Self-pay

## 2020-07-21 DIAGNOSIS — Z885 Allergy status to narcotic agent status: Secondary | ICD-10-CM | POA: Diagnosis not present

## 2020-07-21 DIAGNOSIS — E559 Vitamin D deficiency, unspecified: Secondary | ICD-10-CM | POA: Diagnosis not present

## 2020-07-21 DIAGNOSIS — I119 Hypertensive heart disease without heart failure: Secondary | ICD-10-CM | POA: Diagnosis present

## 2020-07-21 DIAGNOSIS — D259 Leiomyoma of uterus, unspecified: Secondary | ICD-10-CM | POA: Diagnosis not present

## 2020-07-21 DIAGNOSIS — Z79899 Other long term (current) drug therapy: Secondary | ICD-10-CM

## 2020-07-21 DIAGNOSIS — N920 Excessive and frequent menstruation with regular cycle: Secondary | ICD-10-CM | POA: Diagnosis present

## 2020-07-21 DIAGNOSIS — N736 Female pelvic peritoneal adhesions (postinfective): Secondary | ICD-10-CM | POA: Diagnosis present

## 2020-07-21 DIAGNOSIS — Z7901 Long term (current) use of anticoagulants: Secondary | ICD-10-CM | POA: Diagnosis not present

## 2020-07-21 DIAGNOSIS — Z90710 Acquired absence of both cervix and uterus: Secondary | ICD-10-CM | POA: Diagnosis present

## 2020-07-21 DIAGNOSIS — Z793 Long term (current) use of hormonal contraceptives: Secondary | ICD-10-CM | POA: Diagnosis not present

## 2020-07-21 DIAGNOSIS — I10 Essential (primary) hypertension: Secondary | ICD-10-CM | POA: Diagnosis not present

## 2020-07-21 DIAGNOSIS — Z86718 Personal history of other venous thrombosis and embolism: Secondary | ICD-10-CM

## 2020-07-21 DIAGNOSIS — G8918 Other acute postprocedural pain: Secondary | ICD-10-CM | POA: Diagnosis not present

## 2020-07-21 DIAGNOSIS — Z6841 Body Mass Index (BMI) 40.0 and over, adult: Secondary | ICD-10-CM

## 2020-07-21 DIAGNOSIS — N939 Abnormal uterine and vaginal bleeding, unspecified: Secondary | ICD-10-CM

## 2020-07-21 HISTORY — PX: SUPRACERVICAL ABDOMINAL HYSTERECTOMY: SHX5393

## 2020-07-21 HISTORY — PX: HYSTERECTOMY ABDOMINAL WITH SALPINGECTOMY: SHX6725

## 2020-07-21 HISTORY — DX: Acquired absence of both cervix and uterus: Z90.710

## 2020-07-21 LAB — POCT PREGNANCY, URINE: Preg Test, Ur: NEGATIVE

## 2020-07-21 LAB — ABO/RH: ABO/RH(D): B POS

## 2020-07-21 SURGERY — HYSTERECTOMY, TOTAL, ABDOMINAL, WITH SALPINGECTOMY
Anesthesia: Regional | Laterality: Bilateral

## 2020-07-21 MED ORDER — PROPOFOL 10 MG/ML IV BOLUS
INTRAVENOUS | Status: AC
  Filled 2020-07-21: qty 40

## 2020-07-21 MED ORDER — DIPHENHYDRAMINE HCL 50 MG/ML IJ SOLN: 12.5000 mg | Freq: Four times a day (QID) | INTRAMUSCULAR | Status: DC | PRN

## 2020-07-21 MED ORDER — METOPROLOL TARTRATE 5 MG/5ML IV SOLN
INTRAVENOUS | Status: AC
  Filled 2020-07-21: qty 5

## 2020-07-21 MED ORDER — LACTATED RINGERS IV SOLN: INTRAVENOUS | Status: DC

## 2020-07-21 MED ORDER — SUCCINYLCHOLINE CHLORIDE 200 MG/10ML IV SOSY
PREFILLED_SYRINGE | INTRAVENOUS | Status: DC | PRN
  Administered 2020-07-21: 140 mg via INTRAVENOUS

## 2020-07-21 MED ORDER — FENTANYL CITRATE (PF) 250 MCG/5ML IJ SOLN
INTRAMUSCULAR | Status: DC | PRN
  Administered 2020-07-21 (×3): 50 ug via INTRAVENOUS
  Administered 2020-07-21: 100 ug via INTRAVENOUS

## 2020-07-21 MED ORDER — PROPOFOL 10 MG/ML IV BOLUS
INTRAVENOUS | Status: DC | PRN
  Administered 2020-07-21: 200 mg via INTRAVENOUS

## 2020-07-21 MED ORDER — LOSARTAN POTASSIUM 50 MG PO TABS
50.0000 mg | ORAL_TABLET | Freq: Every day | ORAL | Status: DC
  Administered 2020-07-22 – 2020-07-23 (×2): 50 mg via ORAL
  Filled 2020-07-21 (×3): qty 1

## 2020-07-21 MED ORDER — OXYCODONE HCL 5 MG PO TABS: 5.0000 mg | ORAL_TABLET | Freq: Once | ORAL | Status: DC | PRN

## 2020-07-21 MED ORDER — FENTANYL CITRATE (PF) 100 MCG/2ML IJ SOLN: 25.0000 ug | INTRAMUSCULAR | Status: DC | PRN

## 2020-07-21 MED ORDER — POVIDONE-IODINE 10 % EX SWAB: 2.0000 "application " | Freq: Once | CUTANEOUS | Status: DC

## 2020-07-21 MED ORDER — LIDOCAINE 2% (20 MG/ML) 5 ML SYRINGE
INTRAMUSCULAR | Status: AC
  Filled 2020-07-21: qty 5

## 2020-07-21 MED ORDER — ROCURONIUM BROMIDE 10 MG/ML (PF) SYRINGE
PREFILLED_SYRINGE | INTRAVENOUS | Status: DC | PRN
  Administered 2020-07-21: 70 mg via INTRAVENOUS
  Administered 2020-07-21: 20 mg via INTRAVENOUS

## 2020-07-21 MED ORDER — KETOROLAC TROMETHAMINE 30 MG/ML IJ SOLN
INTRAMUSCULAR | Status: AC
  Filled 2020-07-21: qty 1

## 2020-07-21 MED ORDER — ORAL CARE MOUTH RINSE: 15.0000 mL | Freq: Once | OROMUCOSAL | Status: AC

## 2020-07-21 MED ORDER — ONDANSETRON HCL 4 MG/2ML IJ SOLN
INTRAMUSCULAR | Status: DC | PRN
  Administered 2020-07-21: 4 mg via INTRAVENOUS

## 2020-07-21 MED ORDER — GABAPENTIN 100 MG PO CAPS
100.0000 mg | ORAL_CAPSULE | Freq: Three times a day (TID) | ORAL | Status: DC
  Administered 2020-07-21 (×2): 100 mg via ORAL
  Filled 2020-07-21 (×2): qty 1

## 2020-07-21 MED ORDER — IBUPROFEN 800 MG PO TABS
800.0000 mg | ORAL_TABLET | Freq: Three times a day (TID) | ORAL | Status: DC
  Administered 2020-07-23: 800 mg via ORAL
  Filled 2020-07-21 (×5): qty 1

## 2020-07-21 MED ORDER — DIPHENHYDRAMINE HCL 12.5 MG/5ML PO ELIX: 12.5000 mg | ORAL_SOLUTION | Freq: Four times a day (QID) | ORAL | Status: DC | PRN

## 2020-07-21 MED ORDER — ONDANSETRON HCL 4 MG/2ML IJ SOLN: 4.0000 mg | Freq: Four times a day (QID) | INTRAMUSCULAR | Status: DC | PRN

## 2020-07-21 MED ORDER — PHENYLEPHRINE HCL (PRESSORS) 10 MG/ML IV SOLN
INTRAVENOUS | Status: DC | PRN
  Administered 2020-07-21: 80 ug via INTRAVENOUS

## 2020-07-21 MED ORDER — DEXAMETHASONE SODIUM PHOSPHATE 10 MG/ML IJ SOLN
INTRAMUSCULAR | Status: AC
  Filled 2020-07-21: qty 1

## 2020-07-21 MED ORDER — ROCURONIUM BROMIDE 10 MG/ML (PF) SYRINGE
PREFILLED_SYRINGE | INTRAVENOUS | Status: AC
  Filled 2020-07-21: qty 10

## 2020-07-21 MED ORDER — CHLORHEXIDINE GLUCONATE 0.12 % MT SOLN
15.0000 mL | Freq: Once | OROMUCOSAL | Status: AC
  Administered 2020-07-21: 15 mL via OROMUCOSAL
  Filled 2020-07-21: qty 15

## 2020-07-21 MED ORDER — MIDAZOLAM HCL 5 MG/5ML IJ SOLN
INTRAMUSCULAR | Status: DC | PRN
  Administered 2020-07-21: 2 mg via INTRAVENOUS

## 2020-07-21 MED ORDER — OXYCODONE HCL 5 MG/5ML PO SOLN: 5.0000 mg | Freq: Once | ORAL | Status: DC | PRN

## 2020-07-21 MED ORDER — DEXAMETHASONE SODIUM PHOSPHATE 10 MG/ML IJ SOLN
INTRAMUSCULAR | Status: DC | PRN
  Administered 2020-07-21: 10 mg via INTRAVENOUS

## 2020-07-21 MED ORDER — HYDROCHLOROTHIAZIDE 25 MG PO TABS
25.0000 mg | ORAL_TABLET | Freq: Every day | ORAL | Status: DC
  Administered 2020-07-22 – 2020-07-23 (×2): 25 mg via ORAL
  Filled 2020-07-21 (×3): qty 1

## 2020-07-21 MED ORDER — PROMETHAZINE HCL 25 MG/ML IJ SOLN: 6.2500 mg | INTRAMUSCULAR | Status: DC | PRN

## 2020-07-21 MED ORDER — HYDROMORPHONE 1 MG/ML IV SOLN
INTRAVENOUS | Status: AC
  Administered 2020-07-21: 30 mg via INTRAVENOUS
  Filled 2020-07-21: qty 30

## 2020-07-21 MED ORDER — BUPIVACAINE HCL (PF) 0.25 % IJ SOLN
INTRAMUSCULAR | Status: DC | PRN
  Administered 2020-07-21 (×2): 30 mL

## 2020-07-21 MED ORDER — RIVAROXABAN 20 MG PO TABS
20.0000 mg | ORAL_TABLET | Freq: Every day | ORAL | Status: DC
  Administered 2020-07-22: 20 mg via ORAL
  Filled 2020-07-21: qty 1

## 2020-07-21 MED ORDER — METOPROLOL TARTRATE 5 MG/5ML IV SOLN: 2.5000 mg | INTRAVENOUS | Status: DC | PRN

## 2020-07-21 MED ORDER — DIPHENHYDRAMINE HCL 50 MG/ML IJ SOLN
INTRAMUSCULAR | Status: AC
  Administered 2020-07-21: 12.5 mg via INTRAVENOUS
  Filled 2020-07-21: qty 1

## 2020-07-21 MED ORDER — NALOXONE HCL 0.4 MG/ML IJ SOLN: 0.4000 mg | INTRAMUSCULAR | Status: DC | PRN

## 2020-07-21 MED ORDER — 0.9 % SODIUM CHLORIDE (POUR BTL) OPTIME
TOPICAL | Status: DC | PRN
  Administered 2020-07-21 (×2): 1000 mL

## 2020-07-21 MED ORDER — DEXTROSE 5 % IV SOLN
INTRAVENOUS | Status: DC | PRN
  Administered 2020-07-21: 3 g via INTRAVENOUS

## 2020-07-21 MED ORDER — SUGAMMADEX SODIUM 200 MG/2ML IV SOLN
INTRAVENOUS | Status: DC | PRN
  Administered 2020-07-21: 300 mg via INTRAVENOUS

## 2020-07-21 MED ORDER — ACETAMINOPHEN 500 MG PO TABS
1000.0000 mg | ORAL_TABLET | Freq: Once | ORAL | Status: AC
  Administered 2020-07-21: 1000 mg via ORAL
  Filled 2020-07-21: qty 2

## 2020-07-21 MED ORDER — SODIUM CHLORIDE 0.9% FLUSH: 9.0000 mL | INTRAVENOUS | Status: DC | PRN

## 2020-07-21 MED ORDER — MIDAZOLAM HCL 2 MG/2ML IJ SOLN
INTRAMUSCULAR | Status: AC
  Filled 2020-07-21: qty 2

## 2020-07-21 MED ORDER — HYDROMORPHONE 1 MG/ML IV SOLN
INTRAVENOUS | Status: DC
  Administered 2020-07-21: 0.5 mL via INTRAVENOUS
  Administered 2020-07-21: 0.3 mg via INTRAVENOUS
  Administered 2020-07-22: 0.6 mg via INTRAVENOUS
  Administered 2020-07-22: 0.5 mg via INTRAVENOUS

## 2020-07-21 MED ORDER — SUCCINYLCHOLINE CHLORIDE 200 MG/10ML IV SOSY
PREFILLED_SYRINGE | INTRAVENOUS | Status: AC
  Filled 2020-07-21: qty 10

## 2020-07-21 MED ORDER — SCOPOLAMINE 1 MG/3DAYS TD PT72
1.0000 | MEDICATED_PATCH | TRANSDERMAL | Status: DC
  Administered 2020-07-21: 1.5 mg via TRANSDERMAL
  Filled 2020-07-21: qty 1

## 2020-07-21 MED ORDER — KETOROLAC TROMETHAMINE 30 MG/ML IJ SOLN
30.0000 mg | Freq: Once | INTRAMUSCULAR | Status: AC | PRN
  Administered 2020-07-21: 30 mg via INTRAVENOUS

## 2020-07-21 MED ORDER — FENTANYL CITRATE (PF) 250 MCG/5ML IJ SOLN
INTRAMUSCULAR | Status: AC
  Filled 2020-07-21: qty 5

## 2020-07-21 MED ORDER — LIDOCAINE 2% (20 MG/ML) 5 ML SYRINGE
INTRAMUSCULAR | Status: DC | PRN
  Administered 2020-07-21: 50 mg via INTRAVENOUS

## 2020-07-21 SURGICAL SUPPLY — 44 items
CANISTER SUCT 3000ML PPV (MISCELLANEOUS) ×2 IMPLANT
COVER WAND RF STERILE (DRAPES) ×2 IMPLANT
DECANTER SPIKE VIAL GLASS SM (MISCELLANEOUS) IMPLANT
DRAPE CESAREAN BIRTH W POUCH (DRAPES) ×2 IMPLANT
DRAPE WARM FLUID 44X44 (DRAPES) IMPLANT
DRSG OPSITE POSTOP 4X10 (GAUZE/BANDAGES/DRESSINGS) ×2 IMPLANT
DURAPREP 26ML APPLICATOR (WOUND CARE) ×2 IMPLANT
GAUZE 4X4 16PLY RFD (DISPOSABLE) ×2 IMPLANT
GAUZE SPONGE 4X4 12PLY STRL LF (GAUZE/BANDAGES/DRESSINGS) ×2 IMPLANT
GLOVE BIO SURGEON STRL SZ8 (GLOVE) ×2 IMPLANT
GLOVE BIOGEL PI IND STRL 6.5 (GLOVE) ×1 IMPLANT
GLOVE BIOGEL PI IND STRL 7.0 (GLOVE) ×2 IMPLANT
GLOVE BIOGEL PI IND STRL 8 (GLOVE) ×1 IMPLANT
GLOVE BIOGEL PI INDICATOR 6.5 (GLOVE) ×1
GLOVE BIOGEL PI INDICATOR 7.0 (GLOVE) ×2
GLOVE BIOGEL PI INDICATOR 8 (GLOVE) ×1
GLOVE SURG SS PI 6.5 STRL IVOR (GLOVE) ×2 IMPLANT
GOWN STRL REUS W/ TWL LRG LVL3 (GOWN DISPOSABLE) ×4 IMPLANT
GOWN STRL REUS W/TWL LRG LVL3 (GOWN DISPOSABLE) ×4
GOWN STRL REUS W/TWL XL LVL3 (GOWN DISPOSABLE) ×2 IMPLANT
HEMOSTAT ARISTA ABSORB 3G PWDR (HEMOSTASIS) ×2 IMPLANT
HIBICLENS CHG 4% 4OZ BTL (MISCELLANEOUS) ×2 IMPLANT
KIT DRSG PREVENA PLUS 7DAY 125 (MISCELLANEOUS) ×2 IMPLANT
KIT PREVENA INCISION MGT20CM45 (CANNISTER) ×2 IMPLANT
KIT TURNOVER KIT B (KITS) ×2 IMPLANT
NEEDLE HYPO 22GX1.5 SAFETY (NEEDLE) ×2 IMPLANT
NS IRRIG 1000ML POUR BTL (IV SOLUTION) ×2 IMPLANT
PACK ABDOMINAL GYN (CUSTOM PROCEDURE TRAY) ×2 IMPLANT
PAD ABD 8X7 1/2 STERILE (GAUZE/BANDAGES/DRESSINGS) ×2 IMPLANT
PAD ARMBOARD 7.5X6 YLW CONV (MISCELLANEOUS) ×2 IMPLANT
PAD OB MATERNITY 4.3X12.25 (PERSONAL CARE ITEMS) ×2 IMPLANT
RTRCTR C-SECT PINK 25CM LRG (MISCELLANEOUS) ×2 IMPLANT
SEPRAFILM MEMBRANE 5X6 (MISCELLANEOUS) IMPLANT
SPONGE LAP 18X18 RF (DISPOSABLE) ×6 IMPLANT
SUT PLAIN 2 0 (SUTURE) ×1
SUT PLAIN ABS 2-0 CT1 27XMFL (SUTURE) ×1 IMPLANT
SUT VIC AB 0 CT1 18XCR BRD8 (SUTURE) ×3 IMPLANT
SUT VIC AB 0 CT1 36 (SUTURE) ×4 IMPLANT
SUT VIC AB 0 CT1 8-18 (SUTURE) ×3
SUT VIC AB 4-0 KS 27 (SUTURE) ×4 IMPLANT
SUT VICRYL 0 TIES 12 18 (SUTURE) ×2 IMPLANT
SYR CONTROL 10ML LL (SYRINGE) ×2 IMPLANT
TOWEL GREEN STERILE FF (TOWEL DISPOSABLE) ×4 IMPLANT
TRAY FOLEY W/BAG SLVR 14FR (SET/KITS/TRAYS/PACK) ×2 IMPLANT

## 2020-07-21 NOTE — Anesthesia Preprocedure Evaluation (Addendum)
Anesthesia Evaluation  Patient identified by MRN, date of birth, ID band Patient awake    Reviewed: Allergy & Precautions, NPO status , Patient's Chart, lab work & pertinent test results  Airway Mallampati: I  TM Distance: >3 FB Neck ROM: Full    Dental no notable dental hx.    Pulmonary neg pulmonary ROS,    Pulmonary exam normal breath sounds clear to auscultation       Cardiovascular hypertension, Pt. on medications Normal cardiovascular exam Rhythm:Regular Rate:Normal     Neuro/Psych negative neurological ROS  negative psych ROS   GI/Hepatic negative GI ROS, Neg liver ROS,   Endo/Other  Morbid obesity (SUPER)  Renal/GU negative Renal ROS     Musculoskeletal negative musculoskeletal ROS (+)   Abdominal   Peds  Hematology  (+) anemia ,   Anesthesia Other Findings Fibroids  Reproductive/Obstetrics S/p BTL                           Anesthesia Physical Anesthesia Plan  ASA: IV  Anesthesia Plan: General and Regional   Post-op Pain Management: GA combined w/ Regional for post-op pain   Induction: Intravenous  PONV Risk Score and Plan: 4 or greater and Scopolamine patch - Pre-op, Midazolam, Dexamethasone, Ondansetron and Treatment may vary due to age or medical condition  Airway Management Planned: Oral ETT  Additional Equipment:   Intra-op Plan:   Post-operative Plan: Extubation in OR  Informed Consent: I have reviewed the patients History and Physical, chart, labs and discussed the procedure including the risks, benefits and alternatives for the proposed anesthesia with the patient or authorized representative who has indicated his/her understanding and acceptance.     Dental advisory given  Plan Discussed with: CRNA  Anesthesia Plan Comments:       Anesthesia Quick Evaluation

## 2020-07-21 NOTE — Anesthesia Postprocedure Evaluation (Signed)
Anesthesia Post Note  Patient: Carolyn Mcdonald  Procedure(s) Performed: SUPRACERVICAL HYSTERECTOMY  WITH BILATERAL PARTIAL SALPINGECTOMY (Bilateral )     Patient location during evaluation: PACU Anesthesia Type: Regional and General Level of consciousness: awake Pain management: pain level controlled Vital Signs Assessment: post-procedure vital signs reviewed and stable Respiratory status: spontaneous breathing, nonlabored ventilation, respiratory function stable and patient connected to nasal cannula oxygen Cardiovascular status: blood pressure returned to baseline and stable Postop Assessment: no apparent nausea or vomiting Anesthetic complications: no   No complications documented.  Last Vitals:  Vitals:   07/21/20 1242 07/21/20 1553  BP: 132/67   Pulse: 68   Resp: 18 (!) 21  Temp: 36.9 C   SpO2: 100% 97%    Last Pain:  Vitals:   07/21/20 1553  TempSrc:   PainSc: 6                  Nova Schmuhl [employer]

## 2020-07-21 NOTE — Transfer of Care (Signed)
Immediate Anesthesia Transfer of Care Note  Patient: Carolyn Mcdonald  Procedure(s) Performed: SUPRACERVICAL HYSTERECTOMY  WITH BILATERAL PARTIAL SALPINGECTOMY (Bilateral )  Patient Location: PACU  Anesthesia Type:General  Level of Consciousness: awake, alert , oriented and patient cooperative  Airway & Oxygen Therapy: Patient Spontanous Breathing and Patient connected to nasal cannula oxygen  Post-op Assessment: Report given to RN, Post -op Vital signs reviewed and stable and Patient moving all extremities  Post vital signs: Reviewed and stable  Last Vitals:  Vitals Value Taken Time  BP 126/77 07/21/20 1007  Temp    Pulse 86 07/21/20 1018  Resp 17 07/21/20 1018  SpO2 99 % 07/21/20 1018  Vitals shown include unvalidated device data.  Last Pain:  Vitals:   07/21/20 0634  TempSrc:   PainSc: 0-No pain         Complications: No complications documented.

## 2020-07-21 NOTE — Op Note (Signed)
[name] PROCEDURE DATE: 07/21/2020  PREOPERATIVE DIAGNOSIS:  54 y.o. G3P0 with symptomatic fibroids, menorrhagia POSTOPERATIVE DIAGNOSIS:  Same SURGEON:   Mora Bellman, M.D. ASSISTANT:   Dr. Lynnda Shields OPERATION:  Supracervical abdominal hysterectomy, Bilateral Salpingectomy. Lysis of adhesions ANESTHESIA:  General endotracheal.  INDICATIONS: The patient is a 55 y.o. G3P0 with history of symptomatic uterine fibroids/menorrhagia. The patient made a decision to undergo definite surgical treatment. On the preoperative visit, the risks, benefits, indications, and alternatives of the procedure were reviewed with the patient.  On the day of surgery, the risks of surgery were again discussed with the patient including but not limited to: bleeding which may require transfusion or reoperation; infection which may require antibiotics; injury to bowel, bladder, ureters or other surrounding organs; need for additional procedures; thromboembolic phenomenon, incisional problems and other postoperative/anesthesia complications. Written informed consent was obtained.    OPERATIVE FINDINGS: A 12 week size uterus with normal tubes and ovaries bilaterally. Significant amount of dense adhesions involving the bladder and cervix. Small bowel adhesion to posterior right aspect of uterus.  ESTIMATED BLOOD LOSS: 50 ml FLUIDS:  1300 ml of Lactated Ringers URINE OUTPUT:  450 ml of clear yellow urine. SPECIMENS:  Uterus and tubes sent to pathology COMPLICATIONS:  None immediate.   DESCRIPTION OF PROCEDURE: The patient received intravenous antibiotics and had sequential compression devices applied to her lower extremities while in the preoperative area.   She was taken to the operating room where anesthesia was induced and found to be adequate. The patient was placed in supine position. The abdomen and perineum were prepped and draped in a sterile manner, and a Foley catheter was inserted into the bladder and  attached to Shukri Nistler drainage. After an adequate timeout was performed, a Pfannensteil skin incision was made. This incision was taken down to the fascia using electrocautery with care given to maintain good hemostasis. The fascia was incised in the midline and the fascial incision was then extended bilaterally using mayo scissors. The fascia was then dissected off the underlying rectus muscles using blunt and sharp dissection. The rectus muscles were split bluntly in the midline and the peritoneum entered sharply without complication. This peritoneal incision was then extended superiorly and inferiorly with care given to prevent bowel or bladder injury. Upon entry into the abdominal cavity, the upper abdomen was inspected and found to be normal. Attention was then turned to the pelvis. A retractor was placed into the incision, and the bowel was packed away with moist laparotomy sponges. The uterus at this point was noted to be mobilized. Lysis of adhesion was performed to free the small bowel from the uterus. The round ligaments on each side were clamped, suture ligated, and transected with electrocautery allowing entry into the broad ligament. The anterior and posterior leaves of the broad ligament were separated and the ureters were inspected to be safely away from the area of dissection bilaterally. A hole was created in the clear portion of the posterior broad ligament. Adnexae were clamped on the patient's right side. This pedicle was then clamped, cut, and doubly suture ligated with good hemostasis. This procedure was repeated in an identical fashion on the left site allowing for both adnexa to remain in place.    A bladder flap was then created across the anterior leaf of the broad ligament and the bladder was then bluntly dissected off the lower uterine segment and cervix with good hemostasis. The uterine arteries were then skeletonized bilaterally and then clamped, cut, and ligated  with care given to  prevent ureteral injury. The uterosacral ligaments were then clamped, cut, and ligated bilaterally. Due to dense adhesions involving the bladder and cervix, decision was made to proceed with a supracervical hysterectomy. The uterus was then amputated across the lower uterine segment leaving the cervix intact. The specimen was sent to pathology. The cervical canal was coagulated, and the anterior and posterior peritoneal edges were then reapproximated in the midline over the cervical stump without complication. The pelvis was irrigated and hemostasis was reconfirmed at all pedicles and along the pelvic sidewall.  All laparotomy sponges and instruments were removed from the abdomen. Arista was applied over the cervical stump and pedicles. The peritoneum was closed with 2-0 Vicryl, and the fascia was closed with 0 Vicryl in a running fashion. The subcutaneous layer was reapproximated with 2-0 plain gut. The skin was closed with a 4-0 Monocryl subcuticular stitch. Prevena wound vac was applied over the incision. Sponge, lap, needle, and instrument counts were correct times two. The patient was taken to the recovery area awake, extubated and in stable condition.

## 2020-07-21 NOTE — Anesthesia Procedure Notes (Signed)
Procedure Name: Intubation Date/Time: 07/21/2020 7:48 AM Performed by: Myna Bright, CRNA Pre-anesthesia Checklist: Patient identified, Emergency Drugs available, Suction available and Patient being monitored Patient Re-evaluated:Patient Re-evaluated prior to induction Oxygen Delivery Method: Circle system utilized Preoxygenation: Pre-oxygenation with 100% oxygen Induction Type: IV induction Ventilation: Mask ventilation without difficulty Laryngoscope Size: Mac and 4 Grade View: Grade II Tube type: Oral Tube size: 7.0 mm Number of attempts: 1 Airway Equipment and Method: Stylet Placement Confirmation: ETT inserted through vocal cords under direct vision,  positive ETCO2 and breath sounds checked- equal and bilateral Secured at: 21 cm Tube secured with: Tape Dental Injury: Teeth and Oropharynx as per pre-operative assessment

## 2020-07-21 NOTE — Progress Notes (Signed)
Informed Dr. Roanna Banning about elevated HR.  Order received for Metoprolol 2.5 mg IV.  Handed to CRNA to administer as a block was in progress.

## 2020-07-21 NOTE — Anesthesia Procedure Notes (Signed)
Anesthesia Regional Block: TAP block   Pre-Anesthetic Checklist: ,, timeout performed, Correct Patient, Correct Site, Correct Laterality, Correct Procedure, Correct Position, site marked, Risks and benefits discussed,  Surgical consent,  Pre-op evaluation,  At surgeon's request and post-op pain management  Laterality: Left  Prep: chloraprep       Needles:  Injection technique: Single-shot  Needle Type: Echogenic Stimulator Needle     Needle Length: 10cm  Needle Gauge: 20     Additional Needles:   Procedures:,,,, ultrasound used (permanent image in chart),,,,  Narrative:  Start time: 07/21/2020 7:00 AM End time: 07/21/2020 7:10 AM Injection made incrementally with aspirations every 5 mL.  Performed by: Personally  Anesthesiologist: Murvin Natal, MD  Additional Notes: Functioning IV was confirmed and monitors were applied.  A timeout was performed. Sterile prep, hand hygiene and sterile gloves were used. A 134mm 20ga BBraun echogenic stimulator needle was used. Negative aspiration and negative test dose prior to incremental administration of local anesthetic. The patient tolerated the procedure well.  Ultrasound guidance: relevent anatomy identified, needle position confirmed, local anesthetic spread visualized around nerve(s), vascular puncture avoided.  Image printed for medical record.

## 2020-07-21 NOTE — Anesthesia Procedure Notes (Signed)
Anesthesia Regional Block: TAP block   Pre-Anesthetic Checklist: ,, timeout performed, Correct Patient, Correct Site, Correct Laterality, Correct Procedure, Correct Position, site marked, Risks and benefits discussed,  Surgical consent,  Pre-op evaluation,  At surgeon's request and post-op pain management  Laterality: Right  Prep: chloraprep       Needles:  Injection technique: Single-shot  Needle Type: Echogenic Stimulator Needle     Needle Length: 10cm  Needle Gauge: 20     Additional Needles:   Procedures:,,,, ultrasound used (permanent image in chart),,,,  Narrative:  Start time: 07/21/2020 7:10 AM End time: 07/21/2020 7:20 AM Injection made incrementally with aspirations every 5 mL.  Performed by: Personally  Anesthesiologist: Murvin Natal, MD  Additional Notes: Functioning IV was confirmed and monitors were applied.  A timeout was performed. Sterile prep, hand hygiene and sterile gloves were used. A 162mm 20ga BBraun echogenic stimulator needle was used. Negative aspiration and negative test dose prior to incremental administration of local anesthetic. The patient tolerated the procedure well.  Ultrasound guidance: relevent anatomy identified, needle position confirmed, local anesthetic spread visualized around nerve(s), vascular puncture avoided.  Image printed for medical record.

## 2020-07-22 ENCOUNTER — Encounter (HOSPITAL_COMMUNITY): Payer: Self-pay | Admitting: Obstetrics and Gynecology

## 2020-07-22 LAB — CBC
HCT: 28.7 % — ABNORMAL LOW (ref 36.0–46.0)
Hemoglobin: 9.1 g/dL — ABNORMAL LOW (ref 12.0–15.0)
MCH: 26.3 pg (ref 26.0–34.0)
MCHC: 31.7 g/dL (ref 30.0–36.0)
MCV: 82.9 fL (ref 80.0–100.0)
Platelets: 250 10*3/uL (ref 150–400)
RBC: 3.46 MIL/uL — ABNORMAL LOW (ref 3.87–5.11)
RDW: 16.1 % — ABNORMAL HIGH (ref 11.5–15.5)
WBC: 8.4 10*3/uL (ref 4.0–10.5)
nRBC: 0 % (ref 0.0–0.2)

## 2020-07-22 LAB — SURGICAL PATHOLOGY

## 2020-07-22 MED ORDER — SIMETHICONE 80 MG PO CHEW
80.0000 mg | CHEWABLE_TABLET | Freq: Four times a day (QID) | ORAL | Status: DC | PRN
  Filled 2020-07-22: qty 1

## 2020-07-22 MED ORDER — HYDROMORPHONE HCL 2 MG PO TABS
2.0000 mg | ORAL_TABLET | ORAL | Status: DC | PRN
  Administered 2020-07-22 – 2020-07-23 (×7): 2 mg via ORAL
  Filled 2020-07-22 (×7): qty 1

## 2020-07-22 MED ORDER — FERROUS SULFATE 325 (65 FE) MG PO TABS
325.0000 mg | ORAL_TABLET | Freq: Three times a day (TID) | ORAL | Status: DC
  Administered 2020-07-22 – 2020-07-23 (×4): 325 mg via ORAL
  Filled 2020-07-22 (×4): qty 1

## 2020-07-22 NOTE — Progress Notes (Addendum)
1 Day Post-Op Procedure(s) (LRB): SUPRACERVICAL HYSTERECTOMY  WITH BILATERAL PARTIAL SALPINGECTOMY (Bilateral)  Subjective: Patient reports feeling well. Pain is well controlled with pain medication. She reports some itching associated with dilaudid PCA. Patient tolerates a regular diet. She ambulated. She is passing gas. .    Objective: I have reviewed patient's vital signs and labs. Blood pressure 136/74, pulse (!) 59, temperature 98.8 F (37.1 C), temperature source Oral, resp. rate 17, last menstrual period 05/13/2020, SpO2 100 %.  General: alert, cooperative and no distress Resp: clear to auscultation bilaterally Cardio: regular rate and rhythm GI: soft, appropriately tender, non distended. + BS. Prevena wound vac in place and intact Extremities: extremities normal, atraumatic, no cyanosis or edema and Homans sign is negative, no sign of DVT  CBC Latest Ref Rng & Units 07/22/2020 07/15/2020 05/11/2020  WBC 4.0 - 10.5 K/uL 8.4 5.1 7.2  Hemoglobin 12.0 - 15.0 g/dL 9.1(L) 10.2(L) 10.3(L)  Hematocrit 36 - 46 % 28.7(L) 34.0(L) 32.0(L)  Platelets 150 - 400 K/uL 250 292 342    Assessment: s/p Procedure(s) with comments: SUPRACERVICAL HYSTERECTOMY  WITH BILATERAL PARTIAL SALPINGECTOMY (Bilateral) - tap block: progressing well  Plan: Encourage ambulation Advance to PO medication Discontinue IV fluids  Discontinue Foley Haskell for discharge tomorrow  LOS: 1 day    Renelle Stegenga 07/22/2020, 7:04 AM

## 2020-07-22 NOTE — Discharge Summary (Signed)
Physician Discharge Summary  Patient ID: Carolyn Mcdonald MRN: [id number] DOB/AGE: 10-Feb-1966 54 y.o.  Admit date: 07/21/2020 Discharge date: 07/23/2020  Admission Diagnoses: AUB and fibroid uterus  Discharge Diagnoses:  Active Problems:   Abnormal uterine bleeding (AUB)   S/P abdominal hysterectomy   Discharged Condition: good  Hospital Course: Patient admitted for scheduled abdominal hysterectomy for the definitive treatment of AUB associated with fibroid uterus. Patient had an uncomplicated procedure where a supracervical hysterectomy was performed due to dense adhesions involving the cervix and the bladder. The Patient had an uncomplicated post operative course. She tolerated a regular diet, she voided, passed flatus and ambulated without difficulty. She was found stable for discharge home of POD#2. Discharge instructions were reviewed. Patient will be scheduled for incision check in 1 week for prevena pump removal  Consults: None  Treatments: surgery: supracervical hysterectomy with bilateral salpingectomy  Discharge Exam: Blood pressure 110/65, pulse 69, temperature 98.2 F (36.8 C), temperature source Oral, resp. rate 18, last menstrual period 05/13/2020, SpO2 99 %. GENERAL: Well-developed, well-nourished female in no acute distress.  LUNGS: Clear to auscultation bilaterally.  HEART: Regular rate and rhythm. ABDOMEN: Soft, appropriately tender, nondistended. No organomegaly. EXTREMITIES: No cyanosis, clubbing, or edema, 2+ distal pulses.   Disposition: Home   Allergies as of 07/23/2020      Reactions   Vicodin [hydrocodone-acetaminophen] Hives      Medication List    STOP taking these medications   medroxyPROGESTERone 10 MG tablet Commonly known as: PROVERA     TAKE these medications   docusate sodium 100 MG capsule Commonly known as: COLACE Take 1 capsule (100 mg total) by mouth 2 (two) times daily as needed.   ferrous sulfate 325 (65 FE) MG tablet Commonly  known as: FerrouSul Take 1 tablet (325 mg total) by mouth 2 (two) times daily.   hydrochlorothiazide 25 MG tablet Commonly known as: HYDRODIURIL Take 1 tablet (25 mg total) by mouth daily.   HYDROmorphone 2 MG tablet Commonly known as: DILAUDID Take 1 tablet (2 mg total) by mouth every 3 (three) hours as needed for moderate pain or severe pain.   ibuprofen 800 MG tablet Commonly known as: ADVIL Take 1 tablet (800 mg total) by mouth every 8 (eight) hours.   losartan 50 MG tablet Commonly known as: COZAAR Take 1 tablet (50 mg total) by mouth daily.   multivitamin tablet Take 1 tablet by mouth daily.   rivaroxaban 20 MG Tabs tablet Commonly known as: XARELTO Take 1 tablet (20 mg total) by mouth daily with supper.       Follow-up Information    [facility] Follow up.   Why: An appointment will be scheduled for incision check next Monday or Tuesday Another appointment will be scheduled for you to be seen in 4-5 weeks for post op check Contact information: [address] [place] [phone]              Signed: Mora Bellman 07/23/2020, 8:24 AM

## 2020-07-22 NOTE — Discharge Instructions (Signed)
Abdominal Hysterectomy, Care After This sheet gives you information about how to care for yourself after your procedure. Your health care provider may also give you more specific instructions. If you have problems or questions, contact your health care provider. What can I expect after the procedure? After your procedure, it is common to have:  Pain.  Fatigue.  Poor appetite.  Less interest in sex.  Vaginal bleeding and discharge. You may need to use a sanitary napkin after this procedure. Follow these instructions at home: Bathing  Do not take baths, swim, or use a hot tub until your health care provider approves. Ask your health care provider if you can take showers. You may only be allowed to take sponge baths for bathing.  Keep the bandage (dressing) dry until your health care provider says it can be removed. Incision care   Follow instructions from your health care provider about how to take care of your incision. Make sure you: ? Wash your hands with soap and water before you change your bandage (dressing). If soap and water are not available, use hand sanitizer. ? Change your dressing as told by your health care provider. ? Leave stitches (sutures), skin glue, or adhesive strips in place. These skin closures may need to stay in place for 2 weeks or longer. If adhesive strip edges start to loosen and curl up, you may trim the loose edges. Do not remove adhesive strips completely unless your health care provider tells you to do that.  Check your incision area every day for signs of infection. Check for: ? Redness, swelling, or pain. ? Fluid or blood. ? Warmth. ? Pus or a bad smell. Activity  Do gentle, daily exercises as told by your health care provider. You may be told to take short walks every day and go farther each time.  Do not lift anything that is heavier than 10 lb (4.5 kg), or the limit that your health care provider tells you, until he or she says that it is  safe.  Do not drive or use heavy machinery while taking prescription pain medicine.  Do not drive for 24 hours if you were given a medicine to help you relax (sedative).  Follow your health care provider's instructions about exercise, driving, and general activities. Ask your health care provider what activities are safe for you. Lifestyle  Do not douche, use tampons, or have sex for at least 6 weeks or as told by your health care provider.  Do not drink alcohol until your health care provider approves.  Drink enough fluid to keep your urine clear or pale yellow.  Try to have someone at home with you for the first 1-2 weeks to help.  Do not use any products that contain nicotine or tobacco, such as cigarettes and e-cigarettes. These can delay healing. If you need help quitting, ask your health care provider. General instructions  Take over-the-counter and prescription medicines only as told by your health care provider.  Do not take aspirin or ibuprofen. These medicines can cause bleeding.  To prevent or treat constipation while you are taking prescription pain medicine, your health care provider may recommend that you: ? Drink enough fluid to keep your urine clear or pale yellow. ? Take over-the-counter or prescription medicines. ? Eat foods that are high in fiber, such as fresh fruits and vegetables, whole grains, and beans. ? Limit foods that are high in fat and processed sugars, such as fried and sweet foods.  Keep all  follow-up visits as told by your health care provider. This is important. Contact a health care provider if:  You have chills or fever.  You have redness, swelling, or pain around your incision.  You have fluid or blood coming from your incision.  Your incision feels warm to the touch.  You have pus or a bad smell coming from your incision.  Your incision breaks open.  You feel dizzy or light-headed.  You have pain or bleeding when you urinate.  You  have persistent diarrhea.  You have persistent nausea and vomiting.  You have abnormal vaginal discharge.  You have a rash.  You have any type of abnormal reaction or you develop an allergy to your medicine.  Your pain medicine does not help. Get help right away if:  You have a fever and your symptoms suddenly get worse.  You have severe abdominal pain.  You have shortness of breath.  You faint.  You have pain, swelling, or redness in your leg.  You have heavy vaginal bleeding with blood clots. Summary  After your procedure, it is common to have pain, fatigue and vaginal discharge.  Do not take baths, swim, or use a hot tub until your health care provider approves. Ask your health care provider if you can take showers. You may only be allowed to take sponge baths for bathing.  Follow your health care provider's instructions about exercise, driving, and general activities. Ask your health care provider what activities are safe for you.  Do not lift anything that is heavier than 10 lb (4.5 kg), or the limit that your health care provider tells you, until he or she says that it is safe.  Try to have someone at home with you for the first 1-2 weeks to help. This information is not intended to replace advice given to you by your health care provider. Make sure you discuss any questions you have with your health care provider. Document Revised: 10/02/2018 Document Reviewed: 08/17/2016 Elsevier Patient Education  Seth Ward on my medicine - XARELTO (rivaroxaban)  This medication education was reviewed with me or my healthcare representative as part of my discharge preparation.   WHY WAS XARELTO PRESCRIBED FOR YOU? Xarelto was prescribed to treat blood clots that may have been found in the veins of your legs (deep vein thrombosis) or in your lungs (pulmonary embolism) and to reduce the risk of them occurring again.  What do you need to know about  Xarelto? The dose is one 20 mg tablet taken ONCE A DAY with your evening meal.  DO NOT stop taking Xarelto without talking to the health care provider who prescribed the medication.  Refill your prescription for 20 mg tablets before you run out.  After discharge, you should have regular check-up appointments with your healthcare provider that is prescribing your Xarelto.  In the future your dose may need to be changed if your kidney function changes by a significant amount.  What do you do if you miss a dose? If you are taking Xarelto TWICE DAILY and you miss a dose, take it as soon as you remember. You may take two 15 mg tablets (total 30 mg) at the same time then resume your regularly scheduled 15 mg twice daily the next day.  If you are taking Xarelto ONCE DAILY and you miss a dose, take it as soon as you remember on the same day then continue your regularly scheduled once daily regimen  the next day. Do not take two doses of Xarelto at the same time.   Important Safety Information Xarelto is a blood thinner medicine that can cause bleeding. You should call your healthcare provider right away if you experience any of the following: ? Bleeding from an injury or your nose that does not stop. ? Unusual colored urine (red or dark brown) or unusual colored stools (red or black). ? Unusual bruising for unknown reasons. ? A serious fall or if you hit your head (even if there is no bleeding).  Some medicines may interact with Xarelto and might increase your risk of bleeding while on Xarelto. To help avoid this, consult your healthcare provider or pharmacist prior to using any new prescription or non-prescription medications, including herbals, vitamins, non-steroidal anti-inflammatory drugs (NSAIDs) and supplements.  This website has more information on Xarelto: [email or web address]-benson.[email or web address]

## 2020-07-22 NOTE — Progress Notes (Signed)
Pt ambulated the hall with a walker, standby assist with a nurse. Pt tolerated well.  Pt denies dizziness.

## 2020-07-22 NOTE — Progress Notes (Signed)
Hydromorphone 1 mg /ml  PCA  of which 28 ml wasted  with CN

## 2020-07-23 ENCOUNTER — Telehealth: Payer: Self-pay

## 2020-07-23 MED ORDER — HYDROMORPHONE HCL 2 MG PO TABS: 2.0000 mg | ORAL_TABLET | ORAL | 0 refills | Status: DC | PRN

## 2020-07-23 MED ORDER — IBUPROFEN 800 MG PO TABS
800.0000 mg | ORAL_TABLET | Freq: Three times a day (TID) | ORAL | 0 refills | Status: DC
Start: 2020-07-23 — End: 2021-03-08

## 2020-07-23 MED ORDER — DOCUSATE SODIUM 100 MG PO CAPS: 100.0000 mg | ORAL_CAPSULE | Freq: Two times a day (BID) | ORAL | 2 refills | Status: DC | PRN

## 2020-07-23 MED FILL — IBUPROFEN 800 MG TABLET: 800 | 10 days supply | Qty: 30 | Fill #0

## 2020-07-23 NOTE — Telephone Encounter (Signed)
Patient pharmacy does not carry controlled substances. Rx will need to be sent to another pharmacy. Patient is currently in the hospital post op.  Spoke with patient to confirm a different pharmacy, and she states that she has this issue taken care of.

## 2020-07-27 ENCOUNTER — Telehealth: Payer: Self-pay

## 2020-07-27 NOTE — Telephone Encounter (Signed)
Transition Care Management Follow-up Telephone Call  Date of discharge and from where: 07/23/2020, [facility]   How have you been since you were released from the hospital? She stated she is " doing okay." For pain control she has been alternating ibuprofen and hydromorphone as directed by her provider.  She trying to prevent constipation and has been drinking sufficient fluids and has increased ambulation. She was not aware of the order for colace and will have her husband get some at the pharmacy today.   Any questions or concerns? No   Items Reviewed:  Did the pt receive and understand the discharge instructions provided? Yes   Medications obtained and verified? She said she has all medications except the colace and did not have any questions about her med regime. .informed her that [facility] pharmacy has colace available for purchase and also informed her that the prescription for ibuprofen was sent to [facility] pharmacy and has been filled, cost $3.00.    Other? No   Any new allergies since your discharge? No   Do you have support at home? Yes   Home Care and Equipment/Supplies: Were home health services ordered? No If so, what is the name of the agency? n/a Has the agency set up a time to come to the patient's home?  n/a Were any new equipment or medical supplies ordered?  No What is the name of the medical supply agency? n/a Were you able to get the supplies/equipment? n/a Do you have any questions related to the use of the equipment or supplies? No - n/a  Functional Questionnaire: (I = Independent and D = Dependent) ADLs: independent  Follow up appointments reviewed:   PCP Hospital f/u appt confirmed? Yes  - Dr Juleen China 08/10/2020  Specialist Hospital f/u appt confirmed? Yes  - GYN 07/28/2020 for wound check.  Has prevena pump intact    If their condition worsens, is the pt aware to call PCP or go to the Emergency Dept.? Yes  Was the patient provided with contact  information for the PCP's office or ED?  She has the clinic phone number  Was to pt encouraged to call back with questions or concerns? yes

## 2020-07-28 ENCOUNTER — Ambulatory Visit (INDEPENDENT_AMBULATORY_CARE_PROVIDER_SITE_OTHER): Payer: Medicaid Other | Admitting: Obstetrics and Gynecology

## 2020-07-28 ENCOUNTER — Other Ambulatory Visit: Payer: Self-pay

## 2020-07-28 ENCOUNTER — Encounter: Payer: Self-pay | Admitting: Obstetrics and Gynecology

## 2020-07-28 VITALS — Ht 63.0 in | Wt 325.0 lb

## 2020-07-28 DIAGNOSIS — Z90710 Acquired absence of both cervix and uterus: Secondary | ICD-10-CM

## 2020-07-28 DIAGNOSIS — Z09 Encounter for follow-up examination after completed treatment for conditions other than malignant neoplasm: Secondary | ICD-10-CM

## 2020-07-28 NOTE — Progress Notes (Signed)
GYN Post-OP presents for wound check.

## 2020-07-28 NOTE — Patient Instructions (Signed)
Wound Care, Adult Taking care of your wound properly can help to prevent pain, infection, and scarring. It can also help your wound to heal more quickly. How to care for your wound Wound care      Follow instructions from your health care provider about how to take care of your wound. Make sure you: ? Wash your hands with soap and water before you change the bandage (dressing). If soap and water are not available, use hand sanitizer. ? Change your dressing as told by your health care provider. ? Leave stitches (sutures), skin glue, or adhesive strips in place. These skin closures may need to stay in place for 2 weeks or longer. If adhesive strip edges start to loosen and curl up, you may trim the loose edges. Do not remove adhesive strips completely unless your health care provider tells you to do that.  Check your wound area every day for signs of infection. Check for: ? Redness, swelling, or pain. ? Fluid or blood. ? Warmth. ? Pus or a bad smell.  Ask your health care provider if you should clean the wound with mild soap and water. Doing this may include: ? Using a clean towel to pat the wound dry after cleaning it. Do not rub or scrub the wound. ? Applying a cream or ointment. Do this only as told by your health care provider. ? Covering the incision with a clean dressing.  Ask your health care provider when you can leave the wound uncovered.  Keep the dressing dry until your health care provider says it can be removed. Do not take baths, swim, use a hot tub, or do anything that would put the wound underwater until your health care provider approves. Ask your health care provider if you can take showers. You may only be allowed to take sponge baths. Medicines   If you were prescribed an antibiotic medicine, cream, or ointment, take or use the antibiotic as told by your health care provider. Do not stop taking or using the antibiotic even if your condition improves.  Take  over-the-counter and prescription medicines only as told by your health care provider. If you were prescribed pain medicine, take it 30 or more minutes before you do any wound care or as told by your health care provider. General instructions  Return to your normal activities as told by your health care provider. Ask your health care provider what activities are safe.  Do not scratch or pick at the wound.  Do not use any products that contain nicotine or tobacco, such as cigarettes and e-cigarettes. These may delay wound healing. If you need help quitting, ask your health care provider.  Keep all follow-up visits as told by your health care provider. This is important.  Eat a diet that includes protein, vitamin A, vitamin C, and other nutrient-rich foods to help the wound heal. ? Foods rich in protein include meat, dairy, beans, nuts, and other sources. ? Foods rich in vitamin A include carrots and dark green, leafy vegetables. ? Foods rich in vitamin C include citrus, tomatoes, and other fruits and vegetables. ? Nutrient-rich foods have protein, carbohydrates, fat, vitamins, or minerals. Eat a variety of healthy foods including vegetables, fruits, and whole grains. Contact a health care provider if:  You received a tetanus shot and you have swelling, severe pain, redness, or bleeding at the injection site.  Your pain is not controlled with medicine.  You have redness, swelling, or pain around the wound.    You have fluid or blood coming from the wound.  Your wound feels warm to the touch.  You have pus or a bad smell coming from the wound.  You have a fever or chills.  You are nauseous or you vomit.  You are dizzy. Get help right away if:  You have a red streak going away from your wound.  The edges of the wound open up and separate.  Your wound is bleeding, and the bleeding does not stop with gentle pressure.  You have a rash.  You faint.  You have trouble  breathing. Summary  Always wash your hands with soap and water before changing your bandage (dressing).  To help with healing, eat foods that are rich in protein, vitamin A, vitamin C, and other nutrients.  Check your wound every day for signs of infection. Contact your health care provider if you suspect that your wound is infected. This information is not intended to replace advice given to you by your health care provider. Make sure you discuss any questions you have with your health care provider. Document Revised: 12/17/2018 Document Reviewed: 03/15/2016 Elsevier Patient Education  2020 Elsevier Inc.  

## 2020-07-28 NOTE — Progress Notes (Signed)
   Subjective:    Patient ID: Carolyn Mcdonald, female    DOB: Oct 17, 1965, 54 y.o.   MRN: [id number]  Patient presents for wound check.  She is s/p a supracervical hysterectomy on 07/21/2020.  Currently, the patient is without complaints.    Wound Check      Review of Systems  Constitutional: Negative for chills and fever.  Respiratory: Negative for shortness of breath.   Cardiovascular: Negative for chest pain.       Objective:   Physical Exam Abdominal:     Palpations: Abdomen is soft.     Comments: Incision:  Clean, dry and intact.  Large panus noted.  Sanitary napkin placed over incision to decrease friction and to aid in absorption.             Assessment & Plan:   Encounter Diagnoses  Name Primary?  . Postop check Yes  . S/P abdominal hysterectomy     Patient to return in 3 weeks for her post-op visit.

## 2020-08-10 ENCOUNTER — Telehealth (INDEPENDENT_AMBULATORY_CARE_PROVIDER_SITE_OTHER): Payer: Medicaid Other | Admitting: Internal Medicine

## 2020-08-10 ENCOUNTER — Encounter: Payer: Self-pay | Admitting: Internal Medicine

## 2020-08-10 DIAGNOSIS — I10 Essential (primary) hypertension: Secondary | ICD-10-CM

## 2020-08-10 NOTE — Progress Notes (Signed)
Virtual Visit via Telephone Note  I connected with Carolyn Mcdonald, on 08/10/2020 at 10:55 AM by telephone due to the COVID-19 pandemic and verified that I am speaking with the correct person using two identifiers.   Consent: I discussed the limitations, risks, security and privacy concerns of performing an evaluation and management service by telephone and the availability of in person appointments. I also discussed with the patient that there may be a patient responsible charge related to this service. The patient expressed understanding and agreed to proceed.   Location of Patient: Home   Location of Provider: Clinic    Persons participating in Telemedicine visit: Micheala Emie Sommerfeld [facility] Dr. Juleen China      History of Present Illness: Patient has a visit to f/u on HTN.   Chronic HTN Disease Monitoring:  Home BP Monitoring - 110s/60-70s  Chest pain- no  Dyspnea- no Headache - no  Medications: HCTZ 25 mg, Losartan 50 mg  Compliance- yes Lightheadedness- no  Edema- no       Past Medical History:  Diagnosis Date  . Fibroid   . Hypertension    Allergies  Allergen Reactions  . Vicodin [Hydrocodone-Acetaminophen] Hives    Current Outpatient Medications on File Prior to Visit  Medication Sig Dispense Refill  . docusate sodium (COLACE) 100 MG capsule Take 1 capsule (100 mg total) by mouth 2 (two) times daily as needed. 30 capsule 2  . hydrochlorothiazide (HYDRODIURIL) 25 MG tablet Take 1 tablet (25 mg total) by mouth daily. 90 tablet 3  . HYDROmorphone (DILAUDID) 2 MG tablet Take 1 tablet (2 mg total) by mouth every 3 (three) hours as needed for moderate pain or severe pain. 20 tablet 0  . ibuprofen (ADVIL) 800 MG tablet Take 1 tablet (800 mg total) by mouth every 8 (eight) hours. 30 tablet 0  . losartan (COZAAR) 50 MG tablet Take 1 tablet (50 mg total) by mouth daily. 90 tablet 1  . Multiple Vitamin (MULTIVITAMIN) tablet Take 1 tablet by  mouth daily.    . rivaroxaban (XARELTO) 20 MG TABS tablet Take 1 tablet (20 mg total) by mouth daily with supper. 90 tablet 1   No current facility-administered medications on file prior to visit.    Observations/Objective: NAD. Speaking clearly.  Work of breathing normal.  Alert and oriented. Mood appropriate.   Assessment and Plan: 1. Essential hypertension BP well controlled since starting Losartan. Continue current regimen. Asymptomatic.  Counseled on blood pressure goal of less than 130/80, low-sodium, DASH diet, medication compliance, 150 minutes of moderate intensity exercise per week. Discussed medication compliance, adverse effects.   Follow Up Instructions: 3 month f/u HTN     I discussed the assessment and treatment plan with the patient. The patient was provided an opportunity to ask questions and all were answered. The patient agreed with the plan and demonstrated an understanding of the instructions.   The patient was advised to call back or seek an in-person evaluation if the symptoms worsen or if the condition fails to improve as anticipated.     I provided 9 minutes total of non-face-to-face time during this encounter including median intraservice time, reviewing previous notes, investigations, ordering medications, medical decision making, coordinating care and patient verbalized understanding at the end of the visit.    Phill Myron, D.O. Primary Care at [facility]  08/10/2020, 10:55 AM

## 2020-08-12 ENCOUNTER — Encounter: Payer: Self-pay | Admitting: General Practice

## 2020-08-24 ENCOUNTER — Encounter: Payer: Self-pay | Admitting: Obstetrics and Gynecology

## 2020-08-24 ENCOUNTER — Other Ambulatory Visit: Payer: Self-pay

## 2020-08-24 ENCOUNTER — Ambulatory Visit (INDEPENDENT_AMBULATORY_CARE_PROVIDER_SITE_OTHER): Payer: Medicaid Other | Admitting: Obstetrics and Gynecology

## 2020-08-24 VITALS — BP 126/88 | HR 111 | Ht 63.0 in | Wt 320.0 lb

## 2020-08-24 DIAGNOSIS — Z9889 Other specified postprocedural states: Secondary | ICD-10-CM

## 2020-08-24 NOTE — Progress Notes (Signed)
54 yo s/p [facility] on 07/23/2020 presenting today for post op check. Patient reports doing well and is without complaints. She has slowly returned to her regular activities. Her pain is well controlled with ibuprofen. She denies fever or abnormal drainage from her incision  Past Medical History:  Diagnosis Date  . Fibroid   . Hypertension    Past Surgical History:  Procedure Laterality Date  . ANKLE SURGERY     R ankle   . CESAREAN SECTION     x3  . HYSTERECTOMY ABDOMINAL WITH SALPINGECTOMY Bilateral 07/21/2020   Procedure: SUPRACERVICAL HYSTERECTOMY  WITH BILATERAL PARTIAL SALPINGECTOMY;  Surgeon: Mora Bellman, MD;  Location: [place];  Service: Gynecology;  Laterality: Bilateral;  tap block  . SUPRACERVICAL ABDOMINAL HYSTERECTOMY  07/21/2020  . TOE SURGERY     R Great toe   . TUBAL LIGATION  1993   No family history on file. Social History   Tobacco Use  . Smoking status: Never Smoker  . Smokeless tobacco: Never Used  Vaping Use  . Vaping Use: Never used  Substance Use Topics  . Alcohol use: Never  . Drug use: Never   ROS See pertinent in HPI. All other systems reviewed and negative  Blood pressure 126/88, pulse (!) 111, height 5\' 3"  (1.6 m), weight (!) 320 lb (145.2 kg), last menstrual period 05/13/2020. GENERAL: Well-developed, well-nourished female in no acute distress.  ABDOMEN: Soft, nontender, nondistended. No organomegaly. Incision: healed without erythema, induration or drainage PELVIC: Normal external female genitalia. Vagina is pink and rugated.  Normal discharge. Normal appearing cervix.  EXTREMITIES: No cyanosis, clubbing, or edema, 2+ distal pulses.  A/P Patient here for post op check s/p [facility] - Pathology reviewed with the patient - Patient is medically cleared to return to all of her activities - Normal pap smear 12/2019 - RTC prn

## 2020-08-24 NOTE — Progress Notes (Signed)
54 y.o. GYN presents for Post Op FU, she has no complaints today.

## 2020-08-25 ENCOUNTER — Other Ambulatory Visit: Payer: Self-pay

## 2020-08-25 ENCOUNTER — Inpatient Hospital Stay: Payer: Medicaid Other | Attending: Internal Medicine

## 2020-08-25 DIAGNOSIS — Z23 Encounter for immunization: Secondary | ICD-10-CM | POA: Insufficient documentation

## 2020-08-25 NOTE — Progress Notes (Signed)
   Covid-19 Vaccination Clinic  Name:  Carolyn Mcdonald    MRN: [id number] DOB: 1965/12/27  08/25/2020  Carolyn Mcdonald was observed post Covid-19 immunization for 15 minutes without incident. She was provided with Vaccine Information Sheet and instruction to access the V-Safe system.   Carolyn Mcdonald was instructed to call 911 with any severe reactions post vaccine: Marland Kitchen Difficulty breathing  . Swelling of face and throat  . A fast heartbeat  . A bad rash all over body  . Dizziness and weakness   Immunizations Administered    Name Date Dose VIS Date Route   Pfizer COVID-19 Vaccine 08/25/2020 12:20 PM 0.3 mL 07/01/2020 Intramuscular   Manufacturer: [place]   Lot: [id number]   [place]: [id number]

## 2020-08-27 ENCOUNTER — Telehealth: Payer: Self-pay | Admitting: *Deleted

## 2020-08-27 NOTE — Telephone Encounter (Signed)
Patient called questioning a bump she developed on her knee.  She states she applied a bandaid since it was sensitive to touch and it has slightly improved.  Instructed if if continues to progress she should follow up with PCP.

## 2020-09-08 ENCOUNTER — Telehealth: Payer: Self-pay | Admitting: Internal Medicine

## 2020-09-08 NOTE — Telephone Encounter (Signed)
Patient called in to request a referral for colonoscopy to be sent to [facility] on [address]. Please advise.

## 2020-09-16 MED FILL — HYDROCHLOROTHIAZIDE 25 MG T: 25 | 90 days supply | Qty: 90 | Fill #2

## 2020-09-16 NOTE — Telephone Encounter (Signed)
Please place new referral pt wants to go to [facility] because she wont have to have it done in the hospital with high BMI

## 2020-09-23 ENCOUNTER — Other Ambulatory Visit: Payer: Self-pay | Admitting: Internal Medicine

## 2020-09-23 DIAGNOSIS — Z1211 Encounter for screening for malignant neoplasm of colon: Secondary | ICD-10-CM

## 2020-09-23 NOTE — Telephone Encounter (Signed)
Referral placed.   Phill Myron, D.O. Primary Care at [facility]  09/23/2020, 9:33 AM

## 2020-09-25 DIAGNOSIS — Z20822 Contact with and (suspected) exposure to covid-19: Secondary | ICD-10-CM | POA: Diagnosis not present

## 2020-10-02 ENCOUNTER — Other Ambulatory Visit: Payer: Self-pay | Admitting: Internal Medicine

## 2020-10-02 DIAGNOSIS — I10 Essential (primary) hypertension: Secondary | ICD-10-CM

## 2020-10-02 MED FILL — XARELTO 20 MG TABLET: 20 | 90 days supply | Qty: 90 | Fill #0

## 2020-10-02 MED FILL — LOSARTAN POTASSIUM 50 MG TA: 50 | 90 days supply | Qty: 90 | Fill #0

## 2020-10-20 DIAGNOSIS — Z1211 Encounter for screening for malignant neoplasm of colon: Secondary | ICD-10-CM | POA: Diagnosis not present

## 2020-11-12 ENCOUNTER — Ambulatory Visit: Payer: Medicaid Other | Admitting: Internal Medicine

## 2020-11-17 DIAGNOSIS — Z1211 Encounter for screening for malignant neoplasm of colon: Secondary | ICD-10-CM | POA: Diagnosis not present

## 2020-11-17 DIAGNOSIS — Z1212 Encounter for screening for malignant neoplasm of rectum: Secondary | ICD-10-CM | POA: Diagnosis not present

## 2020-11-17 LAB — HM CT VIRTUAL COLONOSCOPY

## 2020-11-20 ENCOUNTER — Telehealth: Payer: Self-pay | Admitting: Internal Medicine

## 2020-11-20 NOTE — Telephone Encounter (Signed)
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  Also pt request REFERRAL for sleep study. Pt states school suggested due to pt BMI.  Pt ph [phone]

## 2020-11-24 ENCOUNTER — Ambulatory Visit: Payer: Medicaid Other | Admitting: Internal Medicine

## 2020-11-24 ENCOUNTER — Other Ambulatory Visit: Payer: Self-pay

## 2020-11-24 ENCOUNTER — Encounter: Payer: Self-pay | Admitting: Internal Medicine

## 2020-11-24 VITALS — BP 137/91 | HR 94 | Temp 97.3°F | Resp 24 | Wt 317.0 lb

## 2020-11-24 DIAGNOSIS — F4321 Adjustment disorder with depressed mood: Secondary | ICD-10-CM

## 2020-11-24 DIAGNOSIS — G4733 Obstructive sleep apnea (adult) (pediatric): Secondary | ICD-10-CM

## 2020-11-24 DIAGNOSIS — Z87448 Personal history of other diseases of urinary system: Secondary | ICD-10-CM | POA: Diagnosis not present

## 2020-11-24 DIAGNOSIS — I10 Essential (primary) hypertension: Secondary | ICD-10-CM | POA: Diagnosis not present

## 2020-11-24 NOTE — Progress Notes (Signed)
Subjective:    Carolyn Mcdonald - 55 y.o. female MRN [id number]  Date of birth: 1966-05-12  HPI  Carolyn Mcdonald is here for follow up. Reports she was told from work physical that she has blood in her urine. She has never seen any blood in urine. No smoking history.   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 She reports occasionally snores. No episodes of apnea. No episodes of nighttime awakenings with difficulty breathing. No morning headaches. Feels well rested.   Experiencing grief from loss of her daughter 3/4. Has had a lot of trouble with dealing with all of the family members coming to her about it.   Depression screen [facility] 2/9 11/24/2020 01/30/2020 01/20/2020  Decreased Interest 2 0 0  Down, Depressed, Hopeless 2 0 0  PHQ - 2 Score 4 0 0  Altered sleeping 2 2 -  Tired, decreased energy 0 1 -  Change in appetite 2 2 -  Feeling bad or failure about yourself  0 0 -  Trouble concentrating 0 0 -  Moving slowly or fidgety/restless 0 0 -  Suicidal thoughts 0 0 -  PHQ-9 Score 8 5 -     Health Maintenance:  Health Maintenance Due  Topic Date Due  . INFLUENZA VACCINE  Never done  . COVID-19 Vaccine (2 - Pfizer 3-dose series) 09/15/2020    -  reports that she has never smoked. She has never used smokeless tobacco. - Review of Systems: Per HPI. - Past Medical History: Patient Active Problem List   Diagnosis Date Noted  . S/P abdominal hysterectomy 07/21/2020  . Abnormal uterine bleeding (AUB)   . Prediabetes 01/31/2020  . Vitamin D deficiency 01/31/2020  . Essential hypertension 01/30/2020  . Fibroid 01/30/2020  . Morbid obesity ([place]) 01/30/2020   - Medications: reviewed and updated   Objective:   Physical Exam BP (!) 137/91 (BP Location: Right Arm, Patient Position: Sitting, Cuff Size: Large)   Pulse 94   Temp (!) 97.3 F (36.3 C)   Resp (!) 24   Wt (!) 317 lb (143.8 kg)   LMP 05/13/2020   SpO2 94%   BMI 56.15 kg/m   Physical Exam Constitutional:      General: She is not in acute distress.    Appearance: She is not diaphoretic.  HENT:     Head: Normocephalic and atraumatic.  Eyes:     Conjunctiva/sclera: Conjunctivae normal.  Cardiovascular:     Rate and Rhythm: Normal rate and regular rhythm.     Heart sounds: Normal heart sounds. No murmur heard.   Pulmonary:     Effort: Pulmonary effort is normal. No respiratory distress.     Breath sounds: Normal breath sounds.  Musculoskeletal:        General: Normal range of motion.  Skin:    General: Skin is warm and dry.  Neurological:     Mental Status: She is alert and oriented to person, place, and time.  Psychiatric:        Mood and Affect: Affect normal.        Judgment: Judgment normal.            Assessment & Plan:   1. Essential hypertension BP minimally elevated, likely related to acute stress. Will continue to monitor. No change in therapy at present.   2. History of hematuria Will send urine specimens to test for microscopic hematuria.  - Urinalysis, Routine w reflex microscopic -  Urine Culture  3. Grief Provided emotional support during visit. Offered visit with Christa See, LCSW and patient agreeable. Will get this scheduled.   4. Morbid obesity ([place]) - Split night study; Future   Phill Myron, D.O. 11/30/2020, 12:28 PM Primary Care at [facility]

## 2020-11-24 NOTE — Progress Notes (Signed)
States she was told she has blood in her urine  Has or needs order sleep study from DOT.    Daughter passed away 2022/12/14

## 2020-11-24 NOTE — Telephone Encounter (Signed)
Given at [place], 11/24/2020 with Dr. Juleen China.

## 2020-11-25 LAB — URINALYSIS, ROUTINE W REFLEX MICROSCOPIC
Bilirubin, UA: NEGATIVE
Glucose, UA: NEGATIVE
Ketones, UA: NEGATIVE
Leukocytes,UA: NEGATIVE
Nitrite, UA: NEGATIVE
Protein,UA: NEGATIVE
RBC, UA: NEGATIVE
Specific Gravity, UA: 1.008 (ref 1.005–1.030)
Urobilinogen, Ur: 0.2 mg/dL (ref 0.2–1.0)
pH, UA: 6 (ref 5.0–7.5)

## 2020-11-25 LAB — COLOGUARD: COLOGUARD: NEGATIVE

## 2020-11-25 LAB — EXTERNAL GENERIC LAB PROCEDURE: COLOGUARD: NEGATIVE

## 2020-11-26 LAB — URINE CULTURE

## 2020-11-30 NOTE — Telephone Encounter (Signed)
Pt called checking status of referral for sleep study. Pt states she does not see it in [place]. Prior note from Dr. Juleen China indicates referral placed. Pt request confirmation & to whom she was referred.

## 2020-12-04 ENCOUNTER — Other Ambulatory Visit: Payer: Self-pay | Admitting: Internal Medicine

## 2020-12-04 NOTE — Telephone Encounter (Signed)
Left message on voicemail for sleep lab- [phone]

## 2020-12-04 NOTE — Telephone Encounter (Signed)
She has an order in for a split night sleep study at [place]. I placed the order on 3/15 and can see it in the chart.   Phill Myron, D.O. Primary Care at [facility]  12/04/2020, 10:05 AM

## 2020-12-14 ENCOUNTER — Telehealth: Payer: Self-pay | Admitting: Internal Medicine

## 2020-12-14 NOTE — Telephone Encounter (Signed)
Pt would like referral to [facility] on [address], ph [phone] for sleep study. Pt says they told her she can have a sleep study there in April. Pt states she cannot go back to work until sleep study done.

## 2020-12-16 ENCOUNTER — Other Ambulatory Visit: Payer: Self-pay

## 2020-12-16 ENCOUNTER — Ambulatory Visit (HOSPITAL_BASED_OUTPATIENT_CLINIC_OR_DEPARTMENT_OTHER): Payer: Medicaid Other | Attending: Internal Medicine | Admitting: Internal Medicine

## 2020-12-16 ENCOUNTER — Other Ambulatory Visit: Payer: Self-pay | Admitting: Internal Medicine

## 2020-12-16 VITALS — Ht 63.0 in | Wt 312.0 lb

## 2020-12-16 DIAGNOSIS — G4733 Obstructive sleep apnea (adult) (pediatric): Secondary | ICD-10-CM

## 2020-12-16 MED FILL — Hydrochlorothiazide Tab 25 MG: ORAL | 90 days supply | Qty: 90 | Fill #0 | Status: CN

## 2020-12-16 NOTE — Telephone Encounter (Signed)
Referral placed.   Phill Myron, D.O. Primary Care at [facility]  12/16/2020, 9:56 AM

## 2020-12-18 NOTE — Telephone Encounter (Signed)
Patient states she has had the sleep study on 12/16/2020. Just awaiting results.

## 2020-12-22 ENCOUNTER — Telehealth: Payer: Self-pay | Admitting: Internal Medicine

## 2020-12-22 NOTE — Telephone Encounter (Signed)
Patient's last visit on 11/24/2020 with PCP. During that visit urinalysis with routine reflex microscopic and urinalysis culture obtained. No blood work obtained.   Patient received call from Bonnita Nasuti, RN on 11/26/2020 with the following results per PCP: Please call Carolyn Mcdonald about urine results. No blood present in urine specimen. No abnormalities or signs of infection---normal studies.   MyChart also indicates results visible to patient and that patient did see results.   The results of the sleep study have not resulted as of yet. Patient may want to reach out to the office or provider where sleep study was completed for further details.

## 2020-12-22 NOTE — Procedures (Signed)
Patient Name: Carolyn Mcdonald, Carolyn Mcdonald Date: 12/16/2020 Gender: Female D.O.B: 10-Aug-1966 Age (years): 55 Referring Provider: Melina Schools DO Height (inches): 59 Interpreting Physician: Baird Lyons MD, ABSM Weight (lbs): 312 RPSGT: Carolin Coy BMI: 11 MRN: [id number] Neck Size: 15.00  CLINICAL INFORMATION Sleep Study Type: Split Night CPAP Indication for sleep study: Hypertension, Obesity Epworth Sleepiness Score: 1  SLEEP STUDY TECHNIQUE As per the AASM Manual for the Scoring of Sleep and Associated Events v2.3 (April 2016) with a hypopnea requiring 4% desaturations.  The channels recorded and monitored were frontal, central and occipital EEG, electrooculogram (EOG), submentalis EMG (chin), nasal and oral airflow, thoracic and abdominal wall motion, anterior tibialis EMG, snore microphone, electrocardiogram, and pulse oximetry. Continuous positive airway pressure (CPAP) was initiated when the patient met split night criteria and was titrated according to treat sleep-disordered breathing.  MEDICATIONS Medications self-administered by patient taken the night of the study : none reported  RESPIRATORY PARAMETERS Diagnostic  Total AHI (/hr): 13.1 RDI (/hr): 16.7 OA Index (/hr): - CA Index (/hr): 0.0 REM AHI (/hr): 91.4 NREM AHI (/hr): 6.4 Supine AHI (/hr): N/A Non-supine AHI (/hr): 13.1 Min O2 Sat (%): 69.0 Mean O2 (%): 93.5 Time below 88% (min): 8.5   Titration  Optimal Pressure (cm): 11 AHI at Optimal Pressure (/hr): 0.7 Min O2 at Optimal Pressure (%): 91.0 Supine % at Optimal (%): 100 Sleep % at Optimal (%): 99   SLEEP ARCHITECTURE The recording time for the entire night was 389.1 minutes.  During a baseline period of 198.3 minutes, the patient slept for 133.0 minutes in REM and nonREM, yielding a sleep efficiency of 67.1%%. Sleep onset after lights out was 14.1 minutes with a REM latency of 64.5 minutes. The patient spent 8.3%% of the night in stage N1 sleep,  83.8%% in stage N2 sleep, 0.0%% in stage N3 and 7.9% in REM.  During the titration period of 184.9 minutes, the patient slept for 144.5 minutes in REM and nonREM, yielding a sleep efficiency of 78.1%%. Sleep onset after CPAP initiation was 19.4 minutes with a REM latency of 29.5 minutes. The patient spent 11.4%% of the night in stage N1 sleep, 59.9%% in stage N2 sleep, 0.0%% in stage N3 and 28.7% in REM.  CARDIAC DATA The 2 lead EKG demonstrated sinus rhythm. The mean heart rate was 74.2 beats per minute. Other EKG findings include: PACs, PVCs.  LEG MOVEMENT DATA The total Periodic Limb Movements of Sleep (PLMS) were 0. The PLMS index was 0.0 .  IMPRESSIONS - Mild obstructive sleep apnea occurred during the diagnostic portion of the study (AHI = 13.1 /hour). An optimal PAP pressure was selected for this patient ( 11 cm of water) - No significant central sleep apnea occurred during the diagnostic portion of the study (CAI = 0.0/hour). - Moderate oxygen desaturation was noted during the diagnostic portion of the study (Min O2 =69.0%). Minimum O2 sat on CPAP 11 was 91%. - The patient snored with moderate snoring volume during the diagnostic portion of the study. - Occasional PACs and PVCs. - Clinically significant periodic limb movements did not occur during sleep.  DIAGNOSIS - Obstructive Sleep Apnea (G47.33)  RECOMMENDATIONS - Trial of CPAP therapy on 11 cm H2O or autopap 5-15. - Patient used a Medium Wide size Philips Respironics Full Face Mask Dreamwear mask and heated humidification. - Be careful with alcohol, sedatives and other CNS depressants that may worsen sleep apnea and disrupt normal sleep architecture. - Sleep hygiene should be reviewed to assess  factors that may improve sleep quality. - Weight management and regular exercise should be initiated or continued.  [Electronically signed] 12/22/2020 02:12 PM  Baird Lyons MD, [place], American Board of Sleep  Medicine   NPI: [id number]                          [place], [facility]  ELECTRONICALLY SIGNED ON:  12/22/2020, 2:08 PM [place] PH: [phone]   FX: [phone] [place]

## 2020-12-22 NOTE — Telephone Encounter (Signed)
Pt states she got a My Chart notification but cannot see the results. Pt is requesting a call from the office regarding the Sleep study done. Please advise and thank you.

## 2020-12-23 ENCOUNTER — Telehealth: Payer: Self-pay | Admitting: Internal Medicine

## 2020-12-23 ENCOUNTER — Other Ambulatory Visit: Payer: Self-pay

## 2020-12-23 DIAGNOSIS — G4733 Obstructive sleep apnea (adult) (pediatric): Secondary | ICD-10-CM

## 2020-12-23 HISTORY — DX: Obstructive sleep apnea (adult) (pediatric): G47.33

## 2020-12-23 MED ORDER — MISC. DEVICES MISC
1.0000 | Freq: Every day | 0 refills | Status: DC
  Filled 2020-12-23: qty 1, fill #0

## 2020-12-23 MED FILL — Hydrochlorothiazide Tab 25 MG: ORAL | 90 days supply | Qty: 90 | Fill #0 | Status: AC

## 2020-12-23 NOTE — Progress Notes (Signed)
Order placed for CPAP.

## 2020-12-23 NOTE — Addendum Note (Signed)
Addended by: Camillia Herter on: 12/23/2020 01:16 PM   Modules accepted: Orders

## 2020-12-23 NOTE — Telephone Encounter (Signed)
Pt called regarding sleep results. Pt states she needs the results to be emailed for work to:  [email or web address]    Attention to:[employer].   Please advise and thank you

## 2020-12-29 ENCOUNTER — Other Ambulatory Visit: Payer: Self-pay | Admitting: Internal Medicine

## 2020-12-29 DIAGNOSIS — G4733 Obstructive sleep apnea (adult) (pediatric): Secondary | ICD-10-CM

## 2020-12-29 NOTE — Telephone Encounter (Signed)
Pt called wanting to know results of her sleep study. I informed her that her provider explained to her nurse that the results have not been released yet. I also explained to pt that provider suggested that she call [place] to get results. Please follow up with pt regarding this matter.

## 2020-12-29 NOTE — Telephone Encounter (Signed)
DME order for CPAP placed.   Phill Myron, D.O. Primary Care at [facility]  12/29/2020, 6:19 PM

## 2020-12-30 ENCOUNTER — Telehealth: Payer: Self-pay | Admitting: Internal Medicine

## 2020-12-30 NOTE — Telephone Encounter (Signed)
Pt came in the office to have provider fill out DOT Medical Examiner Letter to Clinician: Sleep Apnea. I informed pt that it takes atleast 7 to 10 business day for paperwork to be completed. I put the paper in provider's bin. Please follow up with pt.

## 2021-01-01 ENCOUNTER — Telehealth: Payer: Self-pay | Admitting: Internal Medicine

## 2021-01-04 ENCOUNTER — Telehealth: Payer: Self-pay | Admitting: Internal Medicine

## 2021-01-04 NOTE — Telephone Encounter (Signed)
Called and spoke with pt and she stated that she is needing CY to sign off of split night sleep study as he was the one that read this report.  She stated that she is needing this done so she may return to work.  She is requesting that once the report is signed off that if it can be faxed to [phone] and this is the DOT examiner.  CY please advise. Thanks

## 2021-01-04 NOTE — Telephone Encounter (Signed)
I have called the pt and she stated that they were going to fax this form over to CY.  Will forward over to [facility] to follow up on form.

## 2021-01-04 NOTE — Telephone Encounter (Signed)
Alright, then I will try to help. Is she going to leave the form?

## 2021-01-04 NOTE — Telephone Encounter (Signed)
That sleep study has been interpreted and signed. She can contact the sleep disorders center or the physician who ordered the study to get results sent to DOT.

## 2021-01-04 NOTE — Telephone Encounter (Signed)
I called and  Explained this to the pt.  She stated that DOT is who ordered this test and that her PCP only placed the referral so her insurance would cover the sleep study.  DOT is telling her that the form that they gave her has to be signed by the doctor that interpreted the sleep study.  She stated that DOT will not let her return to work until this form is signed and she stated that her PCP will not sign the form since she did not order the test.  CY please advise. Thanks

## 2021-01-05 ENCOUNTER — Other Ambulatory Visit: Payer: Self-pay

## 2021-01-05 MED FILL — Rivaroxaban Tab 20 MG: ORAL | 90 days supply | Qty: 90 | Fill #0 | Status: AC

## 2021-01-08 NOTE — Telephone Encounter (Signed)
[facility] called about the paperwork that the pt dropped off on 12/30/20. When paperwork is complete please fax to [phone] ATTN: Dr. Annamaria Boots.

## 2021-01-08 NOTE — Telephone Encounter (Signed)
Checked CY's inbox up front and in C Pod. Did not see forms.  Mandi, have you seen these forms?

## 2021-01-08 NOTE — Telephone Encounter (Signed)
Called and spoke with patient to ask her about the form that needs to be signed by Dr. Annamaria Boots. Advised her that we do not have the form. She provided me with number for primary care of [phone]. They stated that they know the patient brought in forms but they will have to ask the provider when they come in on Monday. Provided her with fax number to send form over Attn: Dr. Annamaria Boots. Will wait to see if we receive forms. Once we receive it it needs to be faxed to [phone] DOT examiner

## 2021-01-11 NOTE — Telephone Encounter (Signed)
I have located a form that had been faxed in was in the file for pt pick up. Not sure if this is the form.

## 2021-01-11 NOTE — Telephone Encounter (Signed)
I have not seen these forms. Do we know where they might be or the status of them? Thanks.   Phill Myron, D.O. Primary Care at [facility]  01/11/2021, 2:19 PM

## 2021-01-12 ENCOUNTER — Other Ambulatory Visit: Payer: Self-pay | Admitting: Internal Medicine

## 2021-01-12 ENCOUNTER — Other Ambulatory Visit: Payer: Self-pay

## 2021-01-12 DIAGNOSIS — I10 Essential (primary) hypertension: Secondary | ICD-10-CM

## 2021-01-12 NOTE — Telephone Encounter (Signed)
Form faxed to [phone] Attn Dr. Orson Aloe

## 2021-01-12 NOTE — Telephone Encounter (Signed)
Fax has been received and placed on Dr. Janee Morn desk in C pod will route to him

## 2021-01-12 NOTE — Telephone Encounter (Signed)
Papers are in the bin by front desk

## 2021-01-12 NOTE — Telephone Encounter (Signed)
Mandi from [facility] called stating that the pt has not been established with Dr. Annamaria Boots so they sent the form and sleep study back to pt's PCP to complete.  Dr Annamaria Boots signed the form.

## 2021-01-12 NOTE — Telephone Encounter (Signed)
Called and spoke with EVE at [facility] primary care to see if they have located the forms that need to be signed/filled out by Dr. Annamaria Boots. She stated they have the form and will fax it over. Provided her with fax number [phone]. Will await forms

## 2021-01-12 NOTE — Telephone Encounter (Signed)
I have a form that just asks me to sign and provide contact information. I had just interpreted a sleep study on this person, but have not met or examined her and cannot comment on any fitness for job.

## 2021-01-12 NOTE — Telephone Encounter (Signed)
Called and spoke with Eve to let her know that per our policy I was going to fax the sleep study and the form Dr. Annamaria Boots filled out back to them since they are the ones that ordered the test and they would have to follow up with patient and send it to the DOT examiner. Told her that Dr. Annamaria Boots has never met or examined the patient he just read the sleep study since he is over the sleep center but since they are the ones that ordered it we had to send everything back to them for them to follow up on. She expressed understanding. Faxed to [facility] Primary care, confirmation received. ATC patient to let her know update LMTCB

## 2021-01-13 NOTE — Telephone Encounter (Signed)
I called and spoke with the pt and made her aware of info documented by [facility] below. She verbalized understanding and stated nothing further needed.

## 2021-01-14 NOTE — Telephone Encounter (Signed)
Forms given to Dr.Wallace for completion.

## 2021-01-18 ENCOUNTER — Other Ambulatory Visit: Payer: Self-pay

## 2021-01-18 NOTE — Telephone Encounter (Signed)
Pt called requesting a med refill losartan (COZAAR) 50 MG tablet [[id number]. Pt states she has no more pills left. Please follow with pt regarding issue.

## 2021-01-19 ENCOUNTER — Telehealth: Payer: Self-pay | Admitting: Internal Medicine

## 2021-01-19 DIAGNOSIS — I10 Essential (primary) hypertension: Secondary | ICD-10-CM

## 2021-01-19 MED ORDER — LOSARTAN POTASSIUM 50 MG PO TABS
ORAL_TABLET | Freq: Every day | ORAL | 0 refills | Status: DC
  Filled 2021-01-19: qty 30, 30d supply, fill #0

## 2021-01-19 NOTE — Telephone Encounter (Signed)
Pt called in stating she needs a refill on her losartan (COZAAR) 50 MG tablet    [facility]  [address], [place] [address]  Phone:  [phone] Fax:  [phone]  DEA #:  [id number]

## 2021-01-19 NOTE — Addendum Note (Signed)
Addended by: Carilyn Goodpasture on: 01/19/2021 05:26 PM   Modules accepted: Orders

## 2021-01-20 ENCOUNTER — Other Ambulatory Visit: Payer: Self-pay

## 2021-01-20 ENCOUNTER — Ambulatory Visit (HOSPITAL_COMMUNITY)
Admission: RE | Admit: 2021-01-20 | Discharge: 2021-01-20 | Disposition: A | Discharge status: Home / Self Care | Payer: Medicaid Other | Source: Ambulatory Visit | Attending: Family Medicine | Admitting: Family Medicine

## 2021-01-20 ENCOUNTER — Encounter (HOSPITAL_COMMUNITY): Payer: Self-pay

## 2021-01-20 VITALS — BP 153/96 | HR 92 | Temp 98.6°F | Resp 20

## 2021-01-20 DIAGNOSIS — W57XXXA Bitten or stung by nonvenomous insect and other nonvenomous arthropods, initial encounter: Secondary | ICD-10-CM | POA: Diagnosis not present

## 2021-01-20 DIAGNOSIS — S30861A Insect bite (nonvenomous) of abdominal wall, initial encounter: Secondary | ICD-10-CM

## 2021-01-20 DIAGNOSIS — L03311 Cellulitis of abdominal wall: Secondary | ICD-10-CM | POA: Diagnosis not present

## 2021-01-20 MED ORDER — DOXYCYCLINE HYCLATE 100 MG PO TABS
100.0000 mg | ORAL_TABLET | Freq: Two times a day (BID) | ORAL | 0 refills | Status: DC
Start: 2021-01-20 — End: 2021-02-12
  Filled 2021-01-20: qty 20, 10d supply, fill #0

## 2021-01-20 MED ORDER — HIBICLENS 4 % EX LIQD
Freq: Every day | CUTANEOUS | 0 refills | Status: DC | PRN
  Filled 2021-01-20: qty 118, 30d supply, fill #0

## 2021-01-20 MED ORDER — MUPIROCIN 2 % EX OINT
1.0000 "application " | TOPICAL_OINTMENT | Freq: Two times a day (BID) | CUTANEOUS | 0 refills | Status: DC
  Filled 2021-01-20: qty 22, 11d supply, fill #0

## 2021-01-20 NOTE — ED Triage Notes (Signed)
Pt in with c/o bug bite that occurred yesterday  Pt states it is swollen and has been bleeding

## 2021-01-20 NOTE — ED Provider Notes (Signed)
[place]    CSN: [id number] Arrival date & time: 01/20/21  1100      History   Chief Complaint Chief Complaint  Patient presents with  . Insect Bite    HPI Carolyn Mcdonald is a 55 y.o. female.   Here today with 1 week history of an insect bite to left lower abdominal wall.  She states the area has progressively become more red, painful, swollen and the wound is now open, draining.  Has been trying to clean with alcohol and keep covered but has not had bandages to keep him on it at all times.  Denies fever, chills, sweats, body aches, fatigue.     Past Medical History:  Diagnosis Date  . Fibroid   . Hypertension     Patient Active Problem List   Diagnosis Date Noted  . Obstructive sleep apnea 12/23/2020  . S/P abdominal hysterectomy 07/21/2020  . Abnormal uterine bleeding (AUB)   . Prediabetes 01/31/2020  . Vitamin D deficiency 01/31/2020  . Essential hypertension 01/30/2020  . Fibroid 01/30/2020  . Morbid obesity ([place]) 01/30/2020    Past Surgical History:  Procedure Laterality Date  . ANKLE SURGERY     R ankle   . CESAREAN SECTION     x3  . HYSTERECTOMY ABDOMINAL WITH SALPINGECTOMY Bilateral 07/21/2020   Procedure: SUPRACERVICAL HYSTERECTOMY  WITH BILATERAL PARTIAL SALPINGECTOMY;  Surgeon: Mora Bellman, MD;  Location: [facility];  Service: Gynecology;  Laterality: Bilateral;  tap block  . SUPRACERVICAL ABDOMINAL HYSTERECTOMY  07/21/2020  . TOE SURGERY     R Great toe   . TUBAL LIGATION  1993    OB History    Gravida  3   Para      Term      Preterm      AB      Living        SAB      IAB      Ectopic      Multiple      Live Births  3            Home Medications    Prior to Admission medications   Medication Sig Start Date End Date Taking? Authorizing Provider  chlorhexidine (HIBICLENS) 4 % external liquid Apply topically daily as needed. 01/20/21  Yes Volney American, PA-C  doxycycline (VIBRA-TABS) 100 MG  tablet Take 1 tablet (100 mg total) by mouth 2 (two) times daily. 01/20/21  Yes Volney American, PA-C  mupirocin ointment (BACTROBAN) 2 % Apply 1 application topically 2 (two) times daily. 01/20/21  Yes Volney American, PA-C  docusate sodium (COLACE) 100 MG capsule Take 1 capsule (100 mg total) by mouth 2 (two) times daily as needed. Patient not taking: Reported on 11/24/2020 07/23/20   Constant, Peggy, MD  hydrochlorothiazide (HYDRODIURIL) 25 MG tablet TAKE 1 TABLET (25 MG TOTAL) BY MOUTH DAILY. 03/12/20 03/23/21  Nicolette Bang, DO  HYDROmorphone (DILAUDID) 2 MG tablet Take 1 tablet (2 mg total) by mouth every 3 (three) hours as needed for moderate pain or severe pain. 07/23/20   Constant, Peggy, MD  ibuprofen (ADVIL) 800 MG tablet Take 1 tablet (800 mg total) by mouth every 8 (eight) hours. 07/23/20   Constant, Peggy, MD  losartan (COZAAR) 50 MG tablet TAKE 1 TABLET BY MOUTH DAILY. 01/19/21 01/19/22  Nicolette Bang, DO  Misc. Devices MISC 1 each by Does not apply route at bedtime. - Trial of CPAP therapy on 11 cm  H2O or autopap 5-15. - Patient used a Medium Wide size Philips Respironics Full Face Mask Dreamwear mask and heated humidification. 12/23/20   Camillia Herter, NP  Multiple Vitamin (MULTIVITAMIN) tablet Take 1 tablet by mouth daily.    [provider]  rivaroxaban (XARELTO) 20 MG TABS tablet TAKE 1 TABLET (20 MG TOTAL) BY MOUTH DAILY WITH SUPPER. 10/02/20 10/02/21  Nicolette Bang, DO    Family History History reviewed. No pertinent family history.  Social History Social History   Tobacco Use  . Smoking status: Never Smoker  . Smokeless tobacco: Never Used  Vaping Use  . Vaping Use: Never used  Substance Use Topics  . Alcohol use: Never  . Drug use: Never     Allergies   Vicodin [hydrocodone-acetaminophen]   Review of Systems Review of Systems Per HPI  Physical Exam Triage Vital Signs ED Triage Vitals  Enc Vitals Group      BP 01/20/21 1157 (!) 153/96     Pulse Rate 01/20/21 1156 92     Resp 01/20/21 1156 20     Temp 01/20/21 1156 98.6 F (37 C)     Temp Source 01/20/21 1156 Oral     SpO2 01/20/21 1156 99 %     Weight --      Height --      Head Circumference --      Peak Flow --      Pain Score 01/20/21 1154 8     Pain Loc --      Pain Edu? --      Excl. in [place]? --    No data found.  Updated Vital Signs BP (!) 153/96   Pulse 92   Temp 98.6 F (37 C) (Oral)   Resp 20   LMP 05/13/2020   SpO2 99%   Visual Acuity Right Eye Distance:   Left Eye Distance:   Bilateral Distance:    Right Eye Near:   Left Eye Near:    Bilateral Near:     Physical Exam Vitals and nursing note reviewed.  Constitutional:      Appearance: Normal appearance. She is not ill-appearing.  HENT:     Head: Atraumatic.     Mouth/Throat:     Mouth: Mucous membranes are moist.     Pharynx: Oropharynx is clear.  Eyes:     Extraocular Movements: Extraocular movements intact.     Conjunctiva/sclera: Conjunctivae normal.  Cardiovascular:     Rate and Rhythm: Normal rate and regular rhythm.     Heart sounds: Normal heart sounds.  Pulmonary:     Effort: Pulmonary effort is normal.     Breath sounds: Normal breath sounds.  Abdominal:     General: Bowel sounds are normal. There is no distension.     Palpations: Abdomen is soft.     Tenderness: There is no abdominal tenderness. There is no right CVA tenderness or guarding.  Musculoskeletal:        General: Normal range of motion.     Cervical back: Normal range of motion and neck supple.  Skin:    General: Skin is warm and dry.     Findings: Erythema and lesion present.     Comments: Open lesion with active drainage, surrounding erythema, firmness, tenderness palpation from the lesion  Neurological:     Mental Status: She is alert and oriented to person, place, and time.  Psychiatric:        Mood and Affect: Mood normal.  Thought Content: Thought content  normal.        Judgment: Judgment normal.      UC Treatments / Results  Labs (all labs ordered are listed, but only abnormal results are displayed) Labs Reviewed - No data to display  EKG   Radiology No results found.  Procedures Procedures (including critical care time)  Medications Ordered in UC Medications - No data to display  Initial Impression / Assessment and Plan / UC Course  I have reviewed the triage vital signs and the nursing notes.  Pertinent labs & imaging results that were available during my care of the patient were reviewed by me and considered in my medical decision making (see chart for details).     Insect bite resulting in cellulitis of surrounding area.  We will treat with doxycycline, Hibiclens wash, mupirocin ointment.  Cover with nonstick gauze and tape here in clinic after being cleaned with Hibiclens.  Discussed home wound care.  Return for acutely worsening symptoms.  Final Clinical Impressions(s) / UC Diagnoses   Final diagnoses:  Cellulitis of abdominal wall  Insect bite of abdominal wall, initial encounter   Discharge Instructions   None    ED Prescriptions    Medication Sig Dispense Auth. Provider   doxycycline (VIBRA-TABS) 100 MG tablet Take 1 tablet (100 mg total) by mouth 2 (two) times daily. 20 tablet Volney American, [place]   chlorhexidine (HIBICLENS) 4 % external liquid Apply topically daily as needed. 120 mL Volney American, PA-C   mupirocin ointment (BACTROBAN) 2 % Apply 1 application topically 2 (two) times daily. 22 g Volney American, [place]     PDMP not reviewed this encounter.   Volney American, [place] 01/20/21 1244

## 2021-01-23 ENCOUNTER — Encounter (HOSPITAL_BASED_OUTPATIENT_CLINIC_OR_DEPARTMENT_OTHER): Payer: Medicaid Other | Admitting: Internal Medicine

## 2021-02-11 ENCOUNTER — Ambulatory Visit: Payer: Medicaid Other | Admitting: Internal Medicine

## 2021-02-11 NOTE — Telephone Encounter (Signed)
Patient transferred care to [facility]

## 2021-02-12 ENCOUNTER — Other Ambulatory Visit: Payer: Self-pay

## 2021-02-12 ENCOUNTER — Encounter: Payer: Self-pay | Admitting: Internal Medicine

## 2021-02-12 ENCOUNTER — Ambulatory Visit: Payer: Medicaid Other | Admitting: Internal Medicine

## 2021-02-12 VITALS — BP 147/85 | HR 96 | Temp 98.4°F | Resp 20 | Wt 324.0 lb

## 2021-02-12 DIAGNOSIS — I10 Essential (primary) hypertension: Secondary | ICD-10-CM | POA: Diagnosis not present

## 2021-02-12 DIAGNOSIS — M170 Bilateral primary osteoarthritis of knee: Secondary | ICD-10-CM | POA: Diagnosis not present

## 2021-02-12 MED ORDER — LOSARTAN POTASSIUM 50 MG PO TABS
ORAL_TABLET | Freq: Every day | ORAL | 1 refills | Status: DC
  Filled 2021-02-12 – 2021-02-19 (×2): qty 90, 90d supply, fill #0
  Filled 2021-05-18 – 2021-05-25 (×2): qty 90, 90d supply, fill #1

## 2021-02-12 NOTE — Progress Notes (Signed)
Losartan Refill  Having joint stiffness States she was bitten by a spider. Completed ATB dose- doxy Wants to know if Tylenol is ok to take. Was told not to take ibuprofen

## 2021-02-12 NOTE — Progress Notes (Signed)
  Subjective:    Carolyn Mcdonald - 55 y.o. female MRN [id number]  Date of birth: March 19, 1966  HPI  Carolyn Mcdonald is here for concerns about joint pain. She reports bilateral knee pain. She reports that yesterday pain was so severe she had trouble standing up straight. She took Tylenol arthritis and this pain improved. Has tried topical pain relief gels without improvement.    Health Maintenance:  Health Maintenance Due  Topic Date Due  . Zoster Vaccines- Shingrix (1 of 2) Never done  . COVID-19 Vaccine (2 - Pfizer 3-dose series) 09/15/2020    -  reports that she has never smoked. She has never used smokeless tobacco. - Review of Systems: Per HPI. - Past Medical History: Patient Active Problem List   Diagnosis Date Noted  . Obstructive sleep apnea 12/23/2020  . S/P abdominal hysterectomy 07/21/2020  . Abnormal uterine bleeding (AUB)   . Prediabetes 01/31/2020  . Vitamin D deficiency 01/31/2020  . Essential hypertension 01/30/2020  . Fibroid 01/30/2020  . Morbid obesity ([place]) 01/30/2020   - Medications: reviewed and updated   Objective:   Physical Exam BP (!) 147/85 (BP Location: Right Arm, Patient Position: Sitting, Cuff Size: Large)   Pulse 96   Temp 98.4 F (36.9 C) (Oral)   Resp 20   Wt (!) 324 lb (147 kg)   LMP 05/13/2020   SpO2 97%   BMI 57.39 kg/m  Physical Exam Constitutional:      General: She is not in acute distress.    Appearance: She is not diaphoretic.  Cardiovascular:     Rate and Rhythm: Normal rate.  Pulmonary:     Effort: Pulmonary effort is normal. No respiratory distress.  Musculoskeletal:        General: Normal range of motion.  Skin:    General: Skin is warm and dry.  Neurological:     Mental Status: She is alert and oriented to person, place, and time.  Psychiatric:        Mood and Affect: Affect normal.        Judgment: Judgment normal.          Assessment & Plan:   1. Bilateral primary osteoarthritis of knee Suspect her  symptoms are related to OA. Reviewed that left knee xray from 1/21 showed OA changes at that time. Can continue to use tylenol arthritis. Discussed option of using topical pain relief gel as well. She is not interested in corticosteroid injections. She is willing to trial PT, referral placed.  - Ambulatory referral to Physical Therapy  2. Essential hypertension BP close to goal. Asymptomatic. Continue Losartan.  - losartan (COZAAR) 50 MG tablet; TAKE 1 TABLET BY MOUTH DAILY.  Dispense: 90 tablet; Refill: Laurence Harbor, D.O. 02/15/2021, 3:04 PM Primary Care at [facility]

## 2021-02-19 ENCOUNTER — Other Ambulatory Visit: Payer: Self-pay

## 2021-03-02 ENCOUNTER — Ambulatory Visit: Payer: Medicaid Other | Admitting: Physical Therapy

## 2021-03-06 NOTE — Progress Notes (Signed)
Virtual Visit via Telephone Note  I connected with Carolyn Mcdonald, on 03/08/2021 at 12:10 PM by telephone due to the COVID-19 pandemic and verified that I am speaking with the correct person using two identifiers.  Due to current restrictions/limitations of in-office visits due to the COVID-19 pandemic, this scheduled clinical appointment was converted to a telehealth visit.   Consent: I discussed the limitations, risks, security and privacy concerns of performing an evaluation and management service by telephone and the availability of in person appointments. I also discussed with the patient that there may be a patient responsible charge related to this service. The patient expressed understanding and agreed to proceed.   Location of Patient: Home  Location of Provider: [facility] participating in Telemedicine visit: Takina Doreene Nest, NP [place], [place]   History of Present Illness: Carolyn Mcdonald is a 55 y.o. female who presents for knee pain follow-up.   Her concerns today include:   KNEE PAIN FOLLOW-UP: 02/12/2021 per DO note: Suspect her symptoms are related to OA. Reviewed that left knee xray from 1/21 showed OA changes at that time. Can continue to use tylenol arthritis. Discussed option of using topical pain relief gel as well. She is not interested in corticosteroid injections. She is willing to trial PT, referral placed.   03/08/2021: Reports at previous visit pain was intermittent. Now pain is constant. She was taking Ibuprofen but felt that she was using too much too quickly. She then switched to Tylenol Arthritis strength which provided some relief.  Knees described as feeling raw pain, sore, and left knee swelling. Sitting in seats with low positioning and air conditioning makes worse. She has an appointment with PT July 2022.    Past Medical History:  Diagnosis Date   Fibroid    Hypertension    Allergies   Allergen Reactions   Vicodin [Hydrocodone-Acetaminophen] Hives    Current Outpatient Medications on File Prior to Visit  Medication Sig Dispense Refill   acetaminophen (TYLENOL) 650 MG CR tablet Take 650 mg by mouth every 8 (eight) hours as needed for pain.     chlorhexidine (HIBICLENS) 4 % external liquid Apply topically daily as needed. 120 mL 0   hydrochlorothiazide (HYDRODIURIL) 25 MG tablet TAKE 1 TABLET (25 MG TOTAL) BY MOUTH DAILY. 90 tablet 3   losartan (COZAAR) 50 MG tablet TAKE 1 TABLET BY MOUTH DAILY. 90 tablet 1   Multiple Vitamin (MULTIVITAMIN) tablet Take 1 tablet by mouth daily.     rivaroxaban (XARELTO) 20 MG TABS tablet TAKE 1 TABLET (20 MG TOTAL) BY MOUTH DAILY WITH SUPPER. 90 tablet 1   Misc. Devices MISC 1 each by Does not apply route at bedtime. - Trial of CPAP therapy on 11 cm H2O or autopap 5-15. - Patient used a Medium Wide size Philips Respironics Full Face Mask Dreamwear mask and heated humidification. 1 each 0   No current facility-administered medications on file prior to visit.    Observations/Objective: Alert and oriented x 3. Not in acute distress. Physical examination not completed as this is a telemedicine visit.  Assessment and Plan: 1. Bilateral primary osteoarthritis of knee: - Worsening since last visit.  - Meloxicam as prescribed.  - Continue over-the-counter Tylenol. - Keep appointment scheduled with Physical Therapy 03/12/2021. - Referral to Orthopedic Surgery for further evaluation and management.  - Follow-up with primary provider as scheduled.  - Ambulatory referral to Orthopedic Surgery - meloxicam (MOBIC) 7.5 MG tablet; Take  1 tablet (7.5 mg total) by mouth daily.  Dispense: 30 tablet; Refill: 0   Follow Up Instructions: Referral to Orthopedic Surgery. Follow-up with primary provider as scheduled.    Patient was given clear instructions to go to Emergency Department or return to medical center if symptoms don't improve, worsen, or new  problems develop.The patient verbalized understanding.  I discussed the assessment and treatment plan with the patient. The patient was provided an opportunity to ask questions and all were answered. The patient agreed with the plan and demonstrated an understanding of the instructions.   The patient was advised to call back or seek an in-person evaluation if the symptoms worsen or if the condition fails to improve as anticipated.   I provided 15 minutes total of non-face-to-face time during this encounter.   Camillia Herter, NP  [facility] [place], [place] 03/08/2021, 12:10 PM

## 2021-03-08 ENCOUNTER — Other Ambulatory Visit: Payer: Self-pay

## 2021-03-08 ENCOUNTER — Telehealth (INDEPENDENT_AMBULATORY_CARE_PROVIDER_SITE_OTHER): Payer: Medicaid Other | Admitting: Family

## 2021-03-08 ENCOUNTER — Encounter: Payer: Self-pay | Admitting: Family

## 2021-03-08 DIAGNOSIS — M170 Bilateral primary osteoarthritis of knee: Secondary | ICD-10-CM

## 2021-03-08 MED ORDER — MELOXICAM 7.5 MG PO TABS
7.5000 mg | ORAL_TABLET | Freq: Every day | ORAL | 0 refills | Status: AC
  Filled 2021-03-08: qty 30, 30d supply, fill #0

## 2021-03-08 NOTE — Progress Notes (Signed)
Pt presents for telemedicine visit for bilateral knee pain has been taking Tylenol for the pain

## 2021-03-12 ENCOUNTER — Ambulatory Visit: Payer: Medicaid Other

## 2021-03-16 ENCOUNTER — Ambulatory Visit: Payer: Medicaid Other | Attending: Internal Medicine

## 2021-03-16 ENCOUNTER — Other Ambulatory Visit: Payer: Self-pay

## 2021-03-16 DIAGNOSIS — M6281 Muscle weakness (generalized): Secondary | ICD-10-CM

## 2021-03-16 DIAGNOSIS — G8929 Other chronic pain: Secondary | ICD-10-CM | POA: Diagnosis present

## 2021-03-16 DIAGNOSIS — M25561 Pain in right knee: Secondary | ICD-10-CM | POA: Diagnosis present

## 2021-03-16 DIAGNOSIS — R262 Difficulty in walking, not elsewhere classified: Secondary | ICD-10-CM

## 2021-03-16 DIAGNOSIS — M25562 Pain in left knee: Secondary | ICD-10-CM | POA: Diagnosis present

## 2021-03-17 NOTE — Therapy (Signed)
[facility] [place], [place], [address] Phone: [phone]   Fax:  [phone]  Physical Therapy Evaluation  Patient Details  Name: Carolyn Mcdonald MRN: [id number] Date of Birth: July 13, 1966 Referring Provider (PT): Nicolette Bang   Encounter Date: 03/16/2021   PT End of Session - 03/16/21 1743     Visit Number 1    Number of Visits 13    Date for PT Re-Evaluation 05/01/21    Authorization Type [employer]    Authorization Time Period requesting auth    PT Start Time 1745    PT Stop Time 1828    PT Time Calculation (min) 43 min    Activity Tolerance Patient tolerated treatment well    Behavior During Therapy [facility] for tasks assessed/performed             Past Medical History:  Diagnosis Date   Fibroid    Hypertension     Past Surgical History:  Procedure Laterality Date   ANKLE SURGERY     R ankle    CESAREAN SECTION     x3   HYSTERECTOMY ABDOMINAL WITH SALPINGECTOMY Bilateral 07/21/2020   Procedure: SUPRACERVICAL HYSTERECTOMY  WITH BILATERAL PARTIAL SALPINGECTOMY;  Surgeon: Mora Bellman, MD;  Location: [facility];  Service: Gynecology;  Laterality: Bilateral;  tap block   SUPRACERVICAL ABDOMINAL HYSTERECTOMY  07/21/2020   TOE SURGERY     R Great toe    TUBAL LIGATION  1993    There were no vitals filed for this visit.    Subjective Assessment - 03/16/21 1747     Subjective Patient reports some days she can't even move due to knee pain and stiffness. Her knees are very stiff (Lt>Rt). She fell a year ago on the Lt knee and had a contusion, but did not notice any pain associated with this fall. 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 She reports popping sensation in the Lt knee when driving or sleeping feeling like it's out of socket. Patient denies any giving  away/instability about the knees. She reports numbness about the LLE prior to her hysterectomy in November 2021 and was found to have a blood clot in the LLE in June 2021 prior to this surgery and is still on blood thinners. No reports of numbness since her surgery in November.    Pertinent History hx of Rt ankle surgery; recent hysterectomy; hx of LLE DVT (on blood thinners)    Limitations Sitting;Standing;Walking    How long can you sit comfortably? can't sit over an hour    How long can you stand comfortably? a good 10 minutes    How long can you walk comfortably? probably 10 minutes    Diagnostic tests Lt knee X-ray 1. No acute fracture or dislocation.  2. Osteoarthritic changes.    Patient Stated Goals "I want to exercise, I want to walk"    Currently in Pain? Yes    Pain Score 2     Pain Location Knee    Pain Orientation Left;Anterior;Posterior    Pain Descriptors / Indicators Other (Comment)   pinching   Pain Type Chronic pain    Pain Onset More than a month ago    Pain Frequency Intermittent    Aggravating Factors  stair negotation (has to complete step to), prolonged sitting, stress, cold    Pain Relieving Factors  heat, OTC pain medication    Effect of Pain on Daily Activities inability to complete walking as part of her exercise routine                [facility] PT Assessment - 03/17/21 0001       Assessment   Medical Diagnosis M17.0 (ICD-10-CM) - Bilateral primary osteoarthritis of knee    Referring Provider (PT) Nicolette Bang    Onset Date/Surgical Date --   worsening in May 2022   Hand Dominance Right    Next MD Visit nothing scheduled    Prior Therapy 13 years ago following surgery for ankle fracture Rt      Precautions   Precautions None      Restrictions   Weight Bearing Restrictions No      Balance Screen   Has the patient fallen in the past 6 months No      [place] residence    Living Arrangements  Spouse/significant other    Additional Comments 4 stairs      Prior Function   Level of Independence Independent    Vocation Full time employment    Vocation Requirements safety assistant/bus driver    Leisure walking, exercise      Cognition   Overall Cognitive Status Within Functional Limits for tasks assessed      Observation/Other Assessments   Focus on Therapeutic Outcomes (FOTO)  NA MCD      Sensation   Light Touch Not tested      Coordination   Gross Motor Movements are Fluid and Coordinated Yes      Posture/Postural Control   Posture/Postural Control Postural limitations    Posture Comments genu valgum, genu recurvatum      AROM   Right Knee Extension --   hyperextension   Right Knee Flexion 110    Left Knee Extension --   hyperextension   Left Knee Flexion 110      Strength   Right Hip Flexion 4+/5    Right Hip Extension 4-/5    Right Hip ABduction 4-/5    Right Hip ADduction 4/5    Left Hip Flexion 4+/5    Left Hip Extension 4-/5    Left Hip ABduction 4-/5    Left Hip ADduction 4/5    Right Knee Flexion 4/5    Right Knee Extension 4/5    Left Knee Flexion 4/5    Left Knee Extension 4/5      Flexibility   Hamstrings WNL bilaterally    Quadriceps 50% limitation bilaterally      Palpation   Patella mobility normal bilaterally    Palpation comment TTP bilateral pes anserine, adductor tubercle, distal adductor musculature      Special Tests   Other special tests (-) Valgus (-) Varus (-)McMurray's (-) Clarke's (-) Anterior Drawer bilaterally      Transfers   Five time sit to stand comments  17.5 seconds      Ambulation/Gait   Gait Comments excessive foot ER bilaterally, limited push-off bilaterally, limited hip extension bilaterally, excessive lateral trunk flexion                        Objective measurements completed on examination: See above findings.               PT Education - 03/17/21 0914     Education Details  Education on current condition, POC, HEP  Person(s) Educated Patient    Methods Explanation;Demonstration;Verbal cues;Tactile cues;Handout    Comprehension Verbalized understanding;Returned demonstration;Verbal cues required              PT Short Term Goals - 03/17/21 0929       PT SHORT TERM GOAL #1   Title Patient will be independent with initial HEP.    Baseline issued at eval.    Time 2    Period Weeks    Status New    Target Date 03/31/21      PT SHORT TERM GOAL #2   Title Patient will improve quadricep flexibility by 25% bilaterally to reduce stress on her knees when completing stair negotiation.    Baseline 50% limited bilaterally    Time 3    Period Weeks    Status New    Target Date 04/07/21      PT SHORT TERM GOAL #3   Title Patient will complete 5xSTS in <12 seconds to signify improvements in functional strength.    Baseline see flowsheet    Time 3    Period Weeks    Status New    Target Date 04/07/21               PT Long Term Goals - 03/17/21 0931       PT LONG TERM GOAL #1   Title Patient will be able to complete stair ascent/descent utilizing reciprocal pattern.    Baseline Step to pattern    Time 6    Period Weeks    Status New    Target Date 04/28/21      PT LONG TERM GOAL #2   Title Patient will demonstrate 4+/5 strength in bilateral hip and knee musculature to improve stability about the chain for prolonged walking and standing.    Baseline see flowsheet    Time 6    Period Weeks    Status New    Target Date 04/28/21      PT LONG TERM GOAL #3   Title Patient will report tolerating at least 30 minutes of walking activity in order to return to walking as part of her exercise routine.    Baseline 10 minutes    Time 6    Period Weeks    Status New    Target Date 04/28/21                    Plan - 03/17/21 0916     Clinical Impression Statement Patient is a 55 y/o female who presents to OPPT with chief complaint of  chronic bilateral knee pain (Lt>Rt) with recent worsening in May 2022 due to prolonged sitting on a bus for job training. She describes the pain as stiffness and pinching about the anterior knees that is worsened with prolonged sitting, standing, or walking activity. Upon assessment she is noted to have bilateral hip and knee weakness, limited knee flexion AROM bilaterally, quadricep tightness, and gait abnormalities. In standing she demonstrates genu valgum and recurvatum bilaterally. She will benefit from skilled PT to address the above stated deficits in order to return to her PLOF.    Personal Factors and Comorbidities Age;Fitness;Time since onset of injury/illness/exacerbation;Profession    Examination-Activity Limitations Stairs;Squat;Stand;Sit;Locomotion Level    Examination-Participation Restrictions Other   exercise   Stability/Clinical Decision Making Stable/Uncomplicated    Clinical Decision Making Low    Rehab Potential Good    PT Frequency 2x / week    PT Duration 6 weeks  PT Treatment/Interventions ADLs/Self Care Home Management;Cryotherapy;Moist Heat;Gait training;Stair training;Therapeutic activities;Therapeutic exercise;Balance training;Neuromuscular re-education;Patient/family education;Manual techniques;Passive range of motion;Dry needling;Taping    PT Next Visit Plan review HEP, hip strengthening, quad strengthening, quad stretching    PT Home Exercise Plan Access Code [email or web address]    Consulted and Agree with Plan of Care Patient             Patient will benefit from skilled therapeutic intervention in order to improve the following deficits and impairments:  Difficulty walking, Decreased activity tolerance, Pain, Impaired flexibility, Decreased strength, Postural dysfunction, Improper body mechanics  Visit Diagnosis: Bilateral chronic knee pain  Muscle weakness (generalized)  Difficulty in walking, not elsewhere classified     Problem List Patient Active Problem  List   Diagnosis Date Noted   Obstructive sleep apnea 12/23/2020   S/P abdominal hysterectomy 07/21/2020   Abnormal uterine bleeding (AUB)    Prediabetes 01/31/2020   Vitamin D deficiency 01/31/2020   Essential hypertension 01/30/2020   Fibroid 01/30/2020   Morbid obesity ([place]) 01/30/2020   Check all possible CPT codes: 97110- Therapeutic Exercise, [phone]- Neuro Re-education, [phone] - Gait Training, [phone] - Manual Therapy, 97530 - Therapeutic Activities, and 12820 - [place] Tamerra Merkley, PT, DPT, ATC 03/17/21 10:33 AM  [facility] [address] [place], [place], [address] Phone: [phone]   Fax:  [phone]  Name: Carolyn Mcdonald MRN: [id number] Date of Birth: 1966/02/18

## 2021-03-25 ENCOUNTER — Telehealth: Payer: Self-pay | Admitting: Internal Medicine

## 2021-03-25 ENCOUNTER — Other Ambulatory Visit: Payer: Self-pay | Admitting: Internal Medicine

## 2021-03-25 ENCOUNTER — Other Ambulatory Visit: Payer: Self-pay

## 2021-03-25 ENCOUNTER — Ambulatory Visit: Payer: Medicaid Other

## 2021-03-25 DIAGNOSIS — R262 Difficulty in walking, not elsewhere classified: Secondary | ICD-10-CM

## 2021-03-25 DIAGNOSIS — M25561 Pain in right knee: Secondary | ICD-10-CM | POA: Diagnosis not present

## 2021-03-25 DIAGNOSIS — M6281 Muscle weakness (generalized): Secondary | ICD-10-CM

## 2021-03-25 DIAGNOSIS — I10 Essential (primary) hypertension: Secondary | ICD-10-CM

## 2021-03-25 DIAGNOSIS — G8929 Other chronic pain: Secondary | ICD-10-CM

## 2021-03-25 MED ORDER — HYDROCHLOROTHIAZIDE 25 MG PO TABS
25.0000 mg | ORAL_TABLET | Freq: Every day | ORAL | 0 refills | Status: DC
  Filled 2021-03-25: qty 90, 90d supply, fill #0

## 2021-03-25 NOTE — Telephone Encounter (Signed)
Patient called in need a refill on her medication  hydrochlorothiazide (HYDRODIURIL) 25 MG tablet     [facility] [place], [place] [address]  Phone:  [phone]  Fax:  [phone]  DEA #:  [id number]

## 2021-03-25 NOTE — Telephone Encounter (Signed)
Hydrochlorothiazide refilled

## 2021-03-25 NOTE — Therapy (Signed)
[facility] [place], [place], [address] Phone: [phone]   Fax:  [phone]  Physical Therapy Treatment  Patient Details  Name: Carolyn Mcdonald MRN: [id number] Date of Birth: 24-Mar-1966 Referring Provider (PT): Nicolette Bang   Encounter Date: 03/25/2021   PT End of Session - 03/25/21 1838     Visit Number 2    Number of Visits 13    Date for PT Re-Evaluation 05/01/21    Authorization Type [employer]    Authorization Time Period auth pending    PT Start Time 1838   patient late   PT Stop Time 1916    PT Time Calculation (min) 38 min    Activity Tolerance Patient tolerated treatment well    Behavior During Therapy [facility] for tasks assessed/performed             Past Medical History:  Diagnosis Date   Fibroid    Hypertension     Past Surgical History:  Procedure Laterality Date   ANKLE SURGERY     R ankle    CESAREAN SECTION     x3   HYSTERECTOMY ABDOMINAL WITH SALPINGECTOMY Bilateral 07/21/2020   Procedure: SUPRACERVICAL HYSTERECTOMY  WITH BILATERAL PARTIAL SALPINGECTOMY;  Surgeon: Mora Bellman, MD;  Location: [facility];  Service: Gynecology;  Laterality: Bilateral;  tap block   SUPRACERVICAL ABDOMINAL HYSTERECTOMY  07/21/2020   TOE SURGERY     R Great toe    TUBAL LIGATION  1993    There were no vitals filed for this visit.   Subjective Assessment - 03/25/21 1839     Subjective Patient reports her knees are feeling pretty good. No pain currently. She reports compliance with HEP.    Pertinent History hx of Rt ankle surgery; recent hysterectomy; hx of LLE DVT (on blood thinners)    Limitations Sitting;Standing;Walking    How long can you sit comfortably? can't sit over an hour    How long can you stand comfortably? a good 10 minutes    How long can you walk comfortably? probably 10 minutes    Diagnostic tests Lt knee X-ray 1. No acute fracture or dislocation.  2. Osteoarthritic  changes.    Patient Stated Goals "I want to exercise, I want to walk"    Currently in Pain? No/denies                               [facility] Adult PT Treatment/Exercise - 03/25/21 0001       Self-Care   Self-Care Other Self-Care Comments    Other Self-Care Comments  see patient eduacation      Knee/Hip Exercises: Public affairs consultant 60 seconds    Quad Stretch Limitations bilateral with strap    Piriformis Stretch 60 seconds    Piriformis Stretch Limitations bilateral      Knee/Hip Exercises: Aerobic   Nustep 5 minutes level 4 UE/LE      Knee/Hip Exercises: Seated   Long Arc Quad 10 reps    Long Arc Quad Weight 2 lbs.    Long [employer] Limitations --      Knee/Hip Exercises: Supine   Bridges 10 reps    Bridges Limitations x2    Straight Leg Raises 10 reps    Straight Leg Raises Limitations x2; bilateral      Knee/Hip Exercises: Sidelying   Hip ABduction 10 reps    Hip ABduction Limitations  x; bilateral    Clams 2 x 10 bilateral blue band                    PT Education - 03/25/21 1917     Education Details Updated HEP.    Person(s) Educated Patient    Methods Explanation;Demonstration;Verbal cues;Handout;Tactile cues    Comprehension Verbalized understanding;Returned demonstration;Verbal cues required;Tactile cues required              PT Short Term Goals - 03/17/21 0929       PT SHORT TERM GOAL #1   Title Patient will be independent with initial HEP.    Baseline issued at eval.    Time 2    Period Weeks    Status New    Target Date 03/31/21      PT SHORT TERM GOAL #2   Title Patient will improve quadricep flexibility by 25% bilaterally to reduce stress on her knees when completing stair negotiation.    Baseline 50% limited bilaterally    Time 3    Period Weeks    Status New    Target Date 04/07/21      PT SHORT TERM GOAL #3   Title Patient will complete 5xSTS in <12 seconds to signify improvements in functional  strength.    Baseline see flowsheet    Time 3    Period Weeks    Status New    Target Date 04/07/21               PT Long Term Goals - 03/17/21 0931       PT LONG TERM GOAL #1   Title Patient will be able to complete stair ascent/descent utilizing reciprocal pattern.    Baseline Step to pattern    Time 6    Period Weeks    Status New    Target Date 04/28/21      PT LONG TERM GOAL #2   Title Patient will demonstrate 4+/5 strength in bilateral hip and knee musculature to improve stability about the chain for prolonged walking and standing.    Baseline see flowsheet    Time 6    Period Weeks    Status New    Target Date 04/28/21      PT LONG TERM GOAL #3   Title Patient will report tolerating at least 30 minutes of walking activity in order to return to walking as part of her exercise routine.    Baseline 10 minutes    Time 6    Period Weeks    Status New    Target Date 04/28/21                   Plan - 03/25/21 1840     Clinical Impression Statement Patient arrives for first f/u visit without reports of knee pain. Reviewed HEP and progressed BLE strengthening with patient reporting more difficulty performing hip strengthening on the LLE compared to the RLE. She has difficulty maintaining lumbopelvic stability with hip abductor strengthening. Patient reported popping with LAQ on the LLE though denies any pain with this movement. Overall she tolerated session well today without reports of pain.    Personal Factors and Comorbidities Age;Fitness;Time since onset of injury/illness/exacerbation;Profession    Examination-Activity Limitations Stairs;Squat;Stand;Sit;Locomotion Level    Examination-Participation Restrictions Other   exercise   Stability/Clinical Decision Making Stable/Uncomplicated    Rehab Potential Good    PT Frequency 2x / week    PT Duration 6 weeks  PT Treatment/Interventions ADLs/Self Care Home Management;Cryotherapy;Moist Heat;Gait  training;Stair training;Therapeutic activities;Therapeutic exercise;Balance training;Neuromuscular re-education;Patient/family education;Manual techniques;Passive range of motion;Dry needling;Taping    PT Next Visit Plan review HEP, hip strengthening, quad strengthening, quad stretching    PT Home Exercise Plan Access Code [email or web address]    Consulted and Agree with Plan of Care Patient             Patient will benefit from skilled therapeutic intervention in order to improve the following deficits and impairments:  Difficulty walking, Decreased activity tolerance, Pain, Impaired flexibility, Decreased strength, Postural dysfunction, Improper body mechanics  Visit Diagnosis: Bilateral chronic knee pain  Muscle weakness (generalized)  Difficulty in walking, not elsewhere classified     Problem List Patient Active Problem List   Diagnosis Date Noted   Obstructive sleep apnea 12/23/2020   S/P abdominal hysterectomy 07/21/2020   Abnormal uterine bleeding (AUB)    Prediabetes 01/31/2020   Vitamin D deficiency 01/31/2020   Essential hypertension 01/30/2020   Fibroid 01/30/2020   Morbid obesity ([place]) 01/30/2020   Gwendolyn Grant, PT, DPT, ATC 03/25/21 7:19 PM  [facility] [address] [place], [place], [address] Phone: [phone]   Fax:  [phone]  Name: Carolyn Mcdonald MRN: [id number] Date of Birth: 1966-02-12

## 2021-03-26 ENCOUNTER — Other Ambulatory Visit: Payer: Self-pay

## 2021-03-27 ENCOUNTER — Ambulatory Visit: Payer: Medicaid Other | Admitting: Rehabilitative and Restorative Service Providers"

## 2021-03-29 ENCOUNTER — Other Ambulatory Visit: Payer: Self-pay

## 2021-03-30 ENCOUNTER — Other Ambulatory Visit: Payer: Self-pay

## 2021-03-30 ENCOUNTER — Ambulatory Visit: Payer: Medicaid Other

## 2021-03-30 DIAGNOSIS — M25561 Pain in right knee: Secondary | ICD-10-CM | POA: Diagnosis not present

## 2021-03-30 DIAGNOSIS — G8929 Other chronic pain: Secondary | ICD-10-CM

## 2021-03-30 DIAGNOSIS — M6281 Muscle weakness (generalized): Secondary | ICD-10-CM

## 2021-03-30 DIAGNOSIS — R262 Difficulty in walking, not elsewhere classified: Secondary | ICD-10-CM

## 2021-03-30 NOTE — Therapy (Signed)
[place], [place], [address] Phone: [phone]   Fax:  [phone]  Physical Therapy Treatment  Patient Details  Name: Carolyn Mcdonald MRN: [id number] Date of Birth: [birth year] Referring Provider (PT): Nicolette Bang   Encounter Date: 03/30/2021   PT End of Session - 03/30/21 1834     Visit Number 3    Number of Visits 13    Date for PT Re-Evaluation 05/01/21    Authorization Type [employer]    Authorization Time Period auth pending    PT Start Time 1834    PT Stop Time 1915    PT Time Calculation (min) 41 min    Activity Tolerance Patient tolerated treatment well    Behavior During Therapy [facility] for tasks assessed/performed             Past Medical History:  Diagnosis Date   Fibroid    Hypertension     Past Surgical History:  Procedure Laterality Date   ANKLE SURGERY     R ankle    CESAREAN SECTION     x3   HYSTERECTOMY ABDOMINAL WITH SALPINGECTOMY Bilateral 07/21/2020   Procedure: SUPRACERVICAL HYSTERECTOMY  WITH BILATERAL PARTIAL SALPINGECTOMY;  Surgeon: Mora Bellman, MD;  Location: [place];  Service: Gynecology;  Laterality: Bilateral;  tap block   SUPRACERVICAL ABDOMINAL HYSTERECTOMY  07/21/2020   TOE SURGERY     R Great toe    TUBAL LIGATION  1993    There were no vitals filed for this visit.   Subjective Assessment - 03/30/21 1835     Subjective Patient reports her knees are feeling stiff. She has been squeezing in her exercises as she can.    Pertinent History hx of Rt ankle surgery; recent hysterectomy; hx of LLE DVT (on blood thinners)    Limitations Sitting;Standing;Walking    How long can you sit comfortably? can't sit over an hour    How long can you stand comfortably? a good 10 minutes    How long can you walk comfortably? probably 10 minutes    Diagnostic tests Lt knee X-ray 1. No acute fracture or dislocation.  2. Osteoarthritic changes.    Patient  Stated Goals "I want to exercise, I want to walk"    Currently in Pain? Yes    Pain Score 3     Pain Location Knee    Pain Orientation Left;Anterior    Pain Descriptors / Indicators Tender    Pain Type Chronic pain    Pain Onset More than a month ago    Pain Frequency Intermittent                [facility] PT Assessment - 03/30/21 0001       Flexibility   Quadriceps PKB Rt 95 degrees, Lt 80 degrees                           [facility] Adult PT Treatment/Exercise - 03/30/21 0001       Self-Care   Other Self-Care Comments  see patient eduacation      Knee/Hip Exercises: Public affairs consultant 60 seconds    Quad Stretch Limitations bilateral with strap      Knee/Hip Exercises: Aerobic   Nustep 5 minutes level 5 UE/LE      Knee/Hip Exercises: Machines for Strengthening   Total Gym Leg Press 2 x 8 @ 40 lbs through partial range (set at  8)      Knee/Hip Exercises: Supine   Straight Leg Raises Limitations 2 x8 with square trace bilaterally      Knee/Hip Exercises: Sidelying   Other Sidelying Knee/Hip Exercises hip circles 2 x 10 bilateral      Knee/Hip Exercises: Prone   Hip Extension 10 reps    Hip Extension Limitations x2; bilateral                    PT Education - 03/30/21 1906     Education Details Updated HEP.    Person(s) Educated Patient    Methods Explanation;Verbal cues;Handout;Tactile cues    Comprehension Verbalized understanding;Returned demonstration;Verbal cues required              PT Short Term Goals - 03/30/21 1851       PT SHORT TERM GOAL #1   Title Patient will be independent with initial HEP.    Baseline issued at eval.    Time 2    Period Weeks    Status Achieved    Target Date 03/31/21      PT SHORT TERM GOAL #2   Title Patient will improve quadricep flexibility by 25% bilaterally to reduce stress on her knees when completing stair negotiation.    Baseline see flowsheet    Time 3    Period Weeks     Status On-going    Target Date 04/07/21      PT SHORT TERM GOAL #3   Title Patient will complete 5xSTS in <12 seconds to signify improvements in functional strength.    Baseline see flowsheet    Time 3    Period Weeks    Status Deferred    Target Date 04/07/21               PT Long Term Goals - 03/17/21 0931       PT LONG TERM GOAL #1   Title Patient will be able to complete stair ascent/descent utilizing reciprocal pattern.    Baseline Step to pattern    Time 6    Period Weeks    Status New    Target Date 04/28/21      PT LONG TERM GOAL #2   Title Patient will demonstrate 4+/5 strength in bilateral hip and knee musculature to improve stability about the chain for prolonged walking and standing.    Baseline see flowsheet    Time 6    Period Weeks    Status New    Target Date 04/28/21      PT LONG TERM GOAL #3   Title Patient will report tolerating at least 30 minutes of walking activity in order to return to walking as part of her exercise routine.    Baseline 10 minutes    Time 6    Period Weeks    Status New    Target Date 04/28/21                   Plan - 03/30/21 1840     Clinical Impression Statement Patient arrives with minimal anterior left knee pain. Her quadricep flexibility has minimally improved compared to baseline remaining significantly tight bilaterally. Able to progress bilateral hip and knee strengthening with patient quickly fatiguing with isolated hip abductor strengthening. Able to complete partial range squat on leg press focusing on reducing knee hyperextension and maintaining neutral knee alignment. She tolerated session well today without increased pain.    Personal Factors and Comorbidities Age;Fitness;Time since  onset of injury/illness/exacerbation;Profession    Examination-Activity Limitations Stairs;Squat;Stand;Sit;Locomotion Level    Examination-Participation Restrictions Other   exercise   Stability/Clinical Decision Making  Stable/Uncomplicated    Rehab Potential Good    PT Frequency 2x / week    PT Duration 6 weeks    PT Treatment/Interventions ADLs/Self Care Home Management;Cryotherapy;Moist Heat;Gait training;Stair training;Therapeutic activities;Therapeutic exercise;Balance training;Neuromuscular re-education;Patient/family education;Manual techniques;Passive range of motion;Dry needling;Taping    PT Next Visit Plan hip strengthening, quad strengthening, quad stretching    PT Home Exercise Plan Access Code [email or web address]    Consulted and Agree with Plan of Care Patient             Patient will benefit from skilled therapeutic intervention in order to improve the following deficits and impairments:  Difficulty walking, Decreased activity tolerance, Pain, Impaired flexibility, Decreased strength, Postural dysfunction, Improper body mechanics  Visit Diagnosis: Bilateral chronic knee pain  Muscle weakness (generalized)  Difficulty in walking, not elsewhere classified     Problem List Patient Active Problem List   Diagnosis Date Noted   Obstructive sleep apnea 12/23/2020   S/P abdominal hysterectomy 07/21/2020   Abnormal uterine bleeding (AUB)    Prediabetes 01/31/2020   Vitamin D deficiency 01/31/2020   Essential hypertension 01/30/2020   Fibroid 01/30/2020   Morbid obesity ([place]) 01/30/2020   Gwendolyn Grant, PT, DPT, ATC 03/30/21 7:18 PM   [place] [facility] [address] [place], [place], [address] Phone: [phone]   Fax:  [phone]  Name: Carolyn Mcdonald MRN: [id number] Date of Birth: November 13, 1965

## 2021-03-31 ENCOUNTER — Other Ambulatory Visit: Payer: Self-pay

## 2021-04-02 ENCOUNTER — Other Ambulatory Visit: Payer: Self-pay

## 2021-04-02 ENCOUNTER — Encounter: Payer: Self-pay | Admitting: Physical Therapy

## 2021-04-02 ENCOUNTER — Ambulatory Visit: Payer: Medicaid Other | Admitting: Physical Therapy

## 2021-04-02 DIAGNOSIS — M6281 Muscle weakness (generalized): Secondary | ICD-10-CM

## 2021-04-02 DIAGNOSIS — M25561 Pain in right knee: Secondary | ICD-10-CM

## 2021-04-02 DIAGNOSIS — R262 Difficulty in walking, not elsewhere classified: Secondary | ICD-10-CM

## 2021-04-02 DIAGNOSIS — G8929 Other chronic pain: Secondary | ICD-10-CM

## 2021-04-02 NOTE — Therapy (Signed)
[place], [place], [address] Phone: [phone]   Fax:  [phone]  Physical Therapy Treatment  Patient Details  Name: Jenefer Kniceley MRN: [id number] Date of Birth: 27-Oct-1965 Referring Provider (PT): Nicolette Bang   Encounter Date: 04/02/2021   PT End of Session - 04/02/21 1108     Visit Number 4    Number of Visits 13    Date for PT Re-Evaluation 05/01/21    Authorization Type [employer]    Authorization Time Period auth pending    PT Start Time 1107   pt arrived late   PT Stop Time 1145    PT Time Calculation (min) 38 min    Activity Tolerance Patient tolerated treatment well    Behavior During Therapy [facility] for tasks assessed/performed             Past Medical History:  Diagnosis Date   Fibroid    Hypertension     Past Surgical History:  Procedure Laterality Date   ANKLE SURGERY     R ankle    CESAREAN SECTION     x3   HYSTERECTOMY ABDOMINAL WITH SALPINGECTOMY Bilateral 07/21/2020   Procedure: SUPRACERVICAL HYSTERECTOMY  WITH BILATERAL PARTIAL SALPINGECTOMY;  Surgeon: Mora Bellman, MD;  Location: [place];  Service: Gynecology;  Laterality: Bilateral;  tap block   SUPRACERVICAL ABDOMINAL HYSTERECTOMY  07/21/2020   TOE SURGERY     R Great toe    TUBAL LIGATION  1993    There were no vitals filed for this visit.   Subjective Assessment - 04/02/21 1108     Subjective " I am feeling more stiffness but not really having any pain."    Patient Stated Goals "I want to exercise, I want to walk"    Currently in Pain? Yes    Pain Score 0-No pain    Pain Location Knee    Pain Orientation Right;Left;Anterior    Pain Type Chronic pain    Pain Onset More than a month ago    Pain Frequency Intermittent    Aggravating Factors  naviagating stairs    Pain Relieving Factors heat, meds,                               [facility] Adult PT Treatment/Exercise -  04/02/21 0001       Knee/Hip Exercises: Aerobic   Nustep 5 minutes level 5 UE/LE      Knee/Hip Exercises: Machines for Strengthening   Cybex Leg Press 2 x 10      Knee/Hip Exercises: Standing   Stairs 6 x up/down 6 inch steps   cues for proper form and demosntration required.     Knee/Hip Exercises: Seated   Long Arc Quad Strengthening;Both;2 sets;10 reps   with ball squeeze   Long Arc Quad Weight 3 lbs.    Marching 1 set;Both;Right;15 reps    Marching Limitations cues to avoid posterior trunk lean    Marching Weights 3 lbs.    Sit to Sand 10 reps;1 set   cues for nose over toes rocking     Knee/Hip Exercises: Sidelying   Hip ABduction Strengthening;Both;2 sets;10 reps                      PT Short Term Goals - 03/30/21 1851       PT SHORT TERM GOAL #1   Title Patient will be  independent with initial HEP.    Baseline issued at eval.    Time 2    Period Weeks    Status Achieved    Target Date 03/31/21      PT SHORT TERM GOAL #2   Title Patient will improve quadricep flexibility by 25% bilaterally to reduce stress on her knees when completing stair negotiation.    Baseline see flowsheet    Time 3    Period Weeks    Status On-going    Target Date 04/07/21      PT SHORT TERM GOAL #3   Title Patient will complete 5xSTS in <12 seconds to signify improvements in functional strength.    Baseline see flowsheet    Time 3    Period Weeks    Status Deferred    Target Date 04/07/21               PT Long Term Goals - 03/17/21 0931       PT LONG TERM GOAL #1   Title Patient will be able to complete stair ascent/descent utilizing reciprocal pattern.    Baseline Step to pattern    Time 6    Period Weeks    Status New    Target Date 04/28/21      PT LONG TERM GOAL #2   Title Patient will demonstrate 4+/5 strength in bilateral hip and knee musculature to improve stability about the chain for prolonged walking and standing.    Baseline see flowsheet     Time 6    Period Weeks    Status New    Target Date 04/28/21      PT LONG TERM GOAL #3   Title Patient will report tolerating at least 30 minutes of walking activity in order to return to walking as part of her exercise routine.    Baseline 10 minutes    Time 6    Period Weeks    Status New    Target Date 04/28/21                   Plan - 04/02/21 1124     Clinical Impression Statement pt reports no pain today describing it more as "stiffness" continued working on hip / knee strengthening with focus on sit to stand biomechanics which she required cues for proper form. practiced ascending/ descending stairs which she required cue to avoid medial collapse which she did well with. end of session she noted no pain but reported she felt like she was worked.    PT Treatment/Interventions ADLs/Self Care Home Management;Cryotherapy;Moist Heat;Gait training;Stair training;Therapeutic activities;Therapeutic exercise;Balance training;Neuromuscular re-education;Patient/family education;Manual techniques;Passive range of motion;Dry needling;Taping    PT Next Visit Plan hip strengthening, quad strengthening, quad stretching, stair training.    PT Home Exercise Plan Access Code [id number]             Patient will benefit from skilled therapeutic intervention in order to improve the following deficits and impairments:  Difficulty walking, Decreased activity tolerance, Pain, Impaired flexibility, Decreased strength, Postural dysfunction, Improper body mechanics  Visit Diagnosis: Bilateral chronic knee pain  Muscle weakness (generalized)  Difficulty in walking, not elsewhere classified     Problem List Patient Active Problem List   Diagnosis Date Noted   Obstructive sleep apnea 12/23/2020   S/P abdominal hysterectomy 07/21/2020   Abnormal uterine bleeding (AUB)    Prediabetes 01/31/2020   Vitamin D deficiency 01/31/2020   Essential hypertension 01/30/2020   Fibroid 01/30/2020  Morbid obesity ([place]) 01/30/2020   Starr [place] PT, DPT, LAT, ATC  04/02/21  11:48 AM     [place] [facility] [address] [place], [place], [address] Phone: [phone]   Fax:  [phone]  Name: Savahna Faulconer MRN: [id number] Date of Birth: 1966/02/28

## 2021-04-03 ENCOUNTER — Ambulatory Visit: Payer: Medicaid Other | Admitting: Physical Therapy

## 2021-04-06 ENCOUNTER — Ambulatory Visit: Payer: Medicaid Other

## 2021-04-06 ENCOUNTER — Other Ambulatory Visit: Payer: Self-pay

## 2021-04-06 DIAGNOSIS — G8929 Other chronic pain: Secondary | ICD-10-CM

## 2021-04-06 DIAGNOSIS — R262 Difficulty in walking, not elsewhere classified: Secondary | ICD-10-CM

## 2021-04-06 DIAGNOSIS — M25561 Pain in right knee: Secondary | ICD-10-CM | POA: Diagnosis not present

## 2021-04-06 DIAGNOSIS — M6281 Muscle weakness (generalized): Secondary | ICD-10-CM

## 2021-04-06 NOTE — Therapy (Signed)
[place], [place], [address] Phone: [phone]   Fax:  [phone]  Physical Therapy Treatment  Patient Details  Name: Carolyn Mcdonald MRN: [id number] Date of Birth: 1966/05/04 Referring Provider (PT): Nicolette Bang   Encounter Date: 04/06/2021   PT End of Session - 04/06/21 1835     Visit Number 5    Number of Visits 13    Date for PT Re-Evaluation 05/01/21    Authorization Type [employer]    Authorization Time Period auth pending    PT Start Time 1835    PT Stop Time 1915    PT Time Calculation (min) 40 min    Activity Tolerance Patient tolerated treatment well    Behavior During Therapy [facility] for tasks assessed/performed             Past Medical History:  Diagnosis Date   Fibroid    Hypertension     Past Surgical History:  Procedure Laterality Date   ANKLE SURGERY     R ankle    CESAREAN SECTION     x3   HYSTERECTOMY ABDOMINAL WITH SALPINGECTOMY Bilateral 07/21/2020   Procedure: SUPRACERVICAL HYSTERECTOMY  WITH BILATERAL PARTIAL SALPINGECTOMY;  Surgeon: Mora Bellman, MD;  Location: [place];  Service: Gynecology;  Laterality: Bilateral;  tap block   SUPRACERVICAL ABDOMINAL HYSTERECTOMY  07/21/2020   TOE SURGERY     R Great toe    TUBAL LIGATION  1993    There were no vitals filed for this visit.   Subjective Assessment - 04/06/21 1836     Subjective "I am still sore from last time, like I worked out."    Patient Stated Goals "I want to exercise, I want to walk"    Currently in Pain? No/denies                               [facility] Adult PT Treatment/Exercise - 04/06/21 0001       Transfers   Five time sit to stand comments  13.7 seconds      Knee/Hip Exercises: Stretches   Passive Hamstring Stretch 30 seconds    Passive Hamstring Stretch Limitations x2; RLE with strap    Quad Stretch 60 seconds    Quad Stretch Limitations bilateral  with strap      Knee/Hip Exercises: Aerobic   Nustep 5 minutes level 5 UE/LE      Knee/Hip Exercises: Standing   Heel Raises 10 reps    Heel Raises Limitations x2    Functional Squat 10 reps    Functional Squat Limitations x2; with BUE support    Other Standing Knee Exercises standing marches 2 x 10      Knee/Hip Exercises: Seated   Hamstring Curl 10 reps    Hamstring Limitations x2; green band      Knee/Hip Exercises: Sidelying   Hip ABduction 10 reps    Hip ABduction Limitations x2; bilateral                      PT Short Term Goals - 04/06/21 1847       PT SHORT TERM GOAL #1   Title Patient will be independent with initial HEP.    Baseline issued at eval.    Time 2    Period Weeks    Status Achieved    Target Date 03/31/21      PT  SHORT TERM GOAL #2   Title Patient will improve quadricep flexibility by 25% bilaterally to reduce stress on her knees when completing stair negotiation.    Baseline see flowsheet    Time 3    Period Weeks    Status On-going    Target Date 04/07/21      PT SHORT TERM GOAL #3   Title Patient will complete 5xSTS in <12 seconds to signify improvements in functional strength.    Baseline 13.7 seconds    Time 3    Period Weeks    Status On-going    Target Date 04/07/21               PT Long Term Goals - 03/17/21 0931       PT LONG TERM GOAL #1   Title Patient will be able to complete stair ascent/descent utilizing reciprocal pattern.    Baseline Step to pattern    Time 6    Period Weeks    Status New    Target Date 04/28/21      PT LONG TERM GOAL #2   Title Patient will demonstrate 4+/5 strength in bilateral hip and knee musculature to improve stability about the chain for prolonged walking and standing.    Baseline see flowsheet    Time 6    Period Weeks    Status New    Target Date 04/28/21      PT LONG TERM GOAL #3   Title Patient will report tolerating at least 30 minutes of walking activity in order  to return to walking as part of her exercise routine.    Baseline 10 minutes    Time 6    Period Weeks    Status New    Target Date 04/28/21                   Plan - 04/06/21 1848     Clinical Impression Statement Patient arrives with BLE muscle soreness that has been present since her last PT session. Her 5 x STS has improved compared to baseline, nearing this short term functional goal. Continued to progress CKC strengthening with patient demonstrating good form with body weight squats utilizing BUE support. Right hamstring cramping reported following calf raises that was resolved with stretching. She has difficulty maintaining balance and neutral trunk positioning with standing marches. She tolerated session well today without reports of pain.    PT Treatment/Interventions ADLs/Self Care Home Management;Cryotherapy;Moist Heat;Gait training;Stair training;Therapeutic activities;Therapeutic exercise;Balance training;Neuromuscular re-education;Patient/family education;Manual techniques;Passive range of motion;Dry needling;Taping    PT Next Visit Plan hip strengthening, quad strengthening, quad stretching, stair training.    PT Home Exercise Plan Access Code [email or web address]    Consulted and Agree with Plan of Care Patient             Patient will benefit from skilled therapeutic intervention in order to improve the following deficits and impairments:  Difficulty walking, Decreased activity tolerance, Pain, Impaired flexibility, Decreased strength, Postural dysfunction, Improper body mechanics  Visit Diagnosis: Bilateral chronic knee pain  Muscle weakness (generalized)  Difficulty in walking, not elsewhere classified     Problem List Patient Active Problem List   Diagnosis Date Noted   Obstructive sleep apnea 12/23/2020   S/P abdominal hysterectomy 07/21/2020   Abnormal uterine bleeding (AUB)    Prediabetes 01/31/2020   Vitamin D deficiency 01/31/2020   Essential  hypertension 01/30/2020   Fibroid 01/30/2020   Morbid obesity ([place]) 01/30/2020   Gwendolyn Grant, PT, DPT,  ATC 04/06/21 7:16 PM   [place], [place], [address] Phone: [phone]   Fax:  [phone]  Name: Carolyn Mcdonald MRN: [id number] Date of Birth: 04-Oct-1965

## 2021-04-08 ENCOUNTER — Other Ambulatory Visit: Payer: Self-pay

## 2021-04-08 ENCOUNTER — Ambulatory Visit: Payer: Medicaid Other

## 2021-04-08 DIAGNOSIS — M6281 Muscle weakness (generalized): Secondary | ICD-10-CM

## 2021-04-08 DIAGNOSIS — G8929 Other chronic pain: Secondary | ICD-10-CM

## 2021-04-08 DIAGNOSIS — R262 Difficulty in walking, not elsewhere classified: Secondary | ICD-10-CM

## 2021-04-08 DIAGNOSIS — M25561 Pain in right knee: Secondary | ICD-10-CM | POA: Diagnosis not present

## 2021-04-08 NOTE — Therapy (Signed)
[place], [place], [address] Phone: [phone]   Fax:  [phone]  Physical Therapy Treatment  Patient Details  Name: Carolyn Mcdonald MRN: [id number] Date of Birth: [birth year] Referring Provider (PT): Nicolette Bang   Encounter Date: 04/08/2021   PT End of Session - 04/08/21 1705     Visit Number 6    Number of Visits 13    Date for PT Re-Evaluation 05/01/21    Authorization Type [employer]    Authorization Time Period auth pending    PT Start Time 1705    PT Stop Time 1744    PT Time Calculation (min) 39 min    Activity Tolerance Patient tolerated treatment well    Behavior During Therapy [facility] for tasks assessed/performed             Past Medical History:  Diagnosis Date   Fibroid    Hypertension     Past Surgical History:  Procedure Laterality Date   ANKLE SURGERY     R ankle    CESAREAN SECTION     x3   HYSTERECTOMY ABDOMINAL WITH SALPINGECTOMY Bilateral 07/21/2020   Procedure: SUPRACERVICAL HYSTERECTOMY  WITH BILATERAL PARTIAL SALPINGECTOMY;  Surgeon: Mora Bellman, MD;  Location: [place];  Service: Gynecology;  Laterality: Bilateral;  tap block   SUPRACERVICAL ABDOMINAL HYSTERECTOMY  07/21/2020   TOE SURGERY     R Great toe    TUBAL LIGATION  1993    There were no vitals filed for this visit.   Subjective Assessment - 04/08/21 1706     Subjective "Still a little sore in my hips and knees. I am working on not locking out my knees as much. I'm getting better with the stairs."    Patient Stated Goals "I want to exercise, I want to walk"    Currently in Pain? No/denies                               [facility] Adult PT Treatment/Exercise - 04/08/21 0001       Self-Care   Other Self-Care Comments  see patient eduacation      Knee/Hip Exercises: Public affairs consultant 60 seconds    Quad Stretch Limitations bilateral with strap    Gastroc  Stretch 60 seconds    Gastroc Stretch Limitations slant board      Knee/Hip Exercises: Aerobic   Nustep 5 minutes level 5 UE/LE      Knee/Hip Exercises: Standing   Hip Extension 10 reps    Extension Limitations x2 bilateral    Functional Squat 10 reps    Functional Squat Limitations x2; with BUE support    Stairs 3 x up/down 6 inch stairs (cueing and demo required for descent)      Knee/Hip Exercises: Sidelying   Clams 2 x 10 bilateral black band                    PT Education - 04/08/21 1737     Education Details Updated HEP    Person(s) Educated Patient    Methods Explanation;Demonstration;Handout    Comprehension Verbalized understanding;Returned demonstration              PT Short Term Goals - 04/06/21 1847       PT SHORT TERM GOAL #1   Title Patient will be independent with initial HEP.    Baseline issued at  eval.    Time 2    Period Weeks    Status Achieved    Target Date 03/31/21      PT SHORT TERM GOAL #2   Title Patient will improve quadricep flexibility by 25% bilaterally to reduce stress on her knees when completing stair negotiation.    Baseline see flowsheet    Time 3    Period Weeks    Status On-going    Target Date 04/07/21      PT SHORT TERM GOAL #3   Title Patient will complete 5xSTS in <12 seconds to signify improvements in functional strength.    Baseline 13.7 seconds    Time 3    Period Weeks    Status On-going    Target Date 04/07/21               PT Long Term Goals - 03/17/21 0931       PT LONG TERM GOAL #1   Title Patient will be able to complete stair ascent/descent utilizing reciprocal pattern.    Baseline Step to pattern    Time 6    Period Weeks    Status New    Target Date 04/28/21      PT LONG TERM GOAL #2   Title Patient will demonstrate 4+/5 strength in bilateral hip and knee musculature to improve stability about the chain for prolonged walking and standing.    Baseline see flowsheet    Time 6     Period Weeks    Status New    Target Date 04/28/21      PT LONG TERM GOAL #3   Title Patient will report tolerating at least 30 minutes of walking activity in order to return to walking as part of her exercise routine.    Baseline 10 minutes    Time 6    Period Weeks    Status New    Target Date 04/28/21                   Plan - 04/08/21 1710     Clinical Impression Statement Patient demonstrates improved mechanics with reciprocal stair ascent, though has difficulty with eccentric control when descending with the RLE. Even with continued practice and verbal cues she is still unable to properly control descent with the RLE. Cramping in the posterior Rt hip reported with squatting that was resolved with stretching.    PT Treatment/Interventions ADLs/Self Care Home Management;Cryotherapy;Moist Heat;Gait training;Stair training;Therapeutic activities;Therapeutic exercise;Balance training;Neuromuscular re-education;Patient/family education;Manual techniques;Passive range of motion;Dry needling;Taping    PT Next Visit Plan hip strengthening, quad strengthening, quad stretching, stair training.    PT Home Exercise Plan Access Code [id number]    Consulted and Agree with Plan of Care Patient             Patient will benefit from skilled therapeutic intervention in order to improve the following deficits and impairments:  Difficulty walking, Decreased activity tolerance, Pain, Impaired flexibility, Decreased strength, Postural dysfunction, Improper body mechanics  Visit Diagnosis: Bilateral chronic knee pain  Muscle weakness (generalized)  Difficulty in walking, not elsewhere classified     Problem List Patient Active Problem List   Diagnosis Date Noted   Obstructive sleep apnea 12/23/2020   S/P abdominal hysterectomy 07/21/2020   Abnormal uterine bleeding (AUB)    Prediabetes 01/31/2020   Vitamin D deficiency 01/31/2020   Essential hypertension 01/30/2020   Fibroid  01/30/2020   Morbid obesity ([place]) 01/30/2020  Gwendolyn Grant, PT, DPT, ATC 04/08/21  [place], [place], [address] Phone: [phone]   Fax:  [phone]  Name: Carolyn Mcdonald MRN: [id number] Date of Birth: [birth year]

## 2021-04-09 ENCOUNTER — Other Ambulatory Visit: Payer: Self-pay | Admitting: Internal Medicine

## 2021-04-12 ENCOUNTER — Other Ambulatory Visit: Payer: Self-pay

## 2021-04-14 ENCOUNTER — Other Ambulatory Visit: Payer: Self-pay

## 2021-04-14 ENCOUNTER — Ambulatory Visit: Payer: Medicaid Other | Attending: Internal Medicine

## 2021-04-14 DIAGNOSIS — M25562 Pain in left knee: Secondary | ICD-10-CM | POA: Insufficient documentation

## 2021-04-14 DIAGNOSIS — M25561 Pain in right knee: Secondary | ICD-10-CM | POA: Diagnosis present

## 2021-04-14 DIAGNOSIS — R262 Difficulty in walking, not elsewhere classified: Secondary | ICD-10-CM | POA: Insufficient documentation

## 2021-04-14 DIAGNOSIS — M6281 Muscle weakness (generalized): Secondary | ICD-10-CM | POA: Insufficient documentation

## 2021-04-14 DIAGNOSIS — G8929 Other chronic pain: Secondary | ICD-10-CM | POA: Insufficient documentation

## 2021-04-14 NOTE — Therapy (Signed)
[place], [place], [address] Phone: [phone]   Fax:  [phone]  Physical Therapy Treatment  Patient Details  Name: Carolyn Mcdonald MRN: [id number] Date of Birth: 1965-10-07 Referring Provider (PT): Nicolette Bang   Encounter Date: 04/14/2021   PT End of Session - 04/14/21 1840     Visit Number 7    Number of Visits 13    Date for PT Re-Evaluation 05/01/21    Authorization Type [employer]    Authorization Time Period auth pending    PT Start Time 1841   patient late   PT Stop Time 1915    PT Time Calculation (min) 34 min    Activity Tolerance Patient tolerated treatment well    Behavior During Therapy [facility] for tasks assessed/performed             Past Medical History:  Diagnosis Date   Fibroid    Hypertension     Past Surgical History:  Procedure Laterality Date   ANKLE SURGERY     R ankle    CESAREAN SECTION     x3   HYSTERECTOMY ABDOMINAL WITH SALPINGECTOMY Bilateral 07/21/2020   Procedure: SUPRACERVICAL HYSTERECTOMY  WITH BILATERAL PARTIAL SALPINGECTOMY;  Surgeon: Mora Bellman, MD;  Location: [place];  Service: Gynecology;  Laterality: Bilateral;  tap block   SUPRACERVICAL ABDOMINAL HYSTERECTOMY  07/21/2020   TOE SURGERY     R Great toe    TUBAL LIGATION  1993    There were no vitals filed for this visit.   Subjective Assessment - 04/14/21 1843     Subjective Patient reports spasming all day in the Rt hip without known cause with nothing helping to alleviate the spasm yesterday. No pain currently.    Patient Stated Goals "I want to exercise, I want to walk"    Currently in Pain? No/denies                [facility] PT Assessment - 04/14/21 0001       Transfers   Five time sit to stand comments  11.1 seconds                           [facility] Adult PT Treatment/Exercise - 04/14/21 0001       Knee/Hip Exercises: Stretches   Sports administrator  60 seconds    Quad Stretch Limitations bilateral with strap      Knee/Hip Exercises: Aerobic   Nustep 5 minutes level 5 UE/LE      Knee/Hip Exercises: Machines for Strengthening   Total Gym Leg Press eccentric RLE 20 lbs 2 x 10      Knee/Hip Exercises: Seated   Sit to [employer] 10 reps   5 lb kettlebell                     PT Short Term Goals - 04/14/21 1853       PT SHORT TERM GOAL #1   Title Patient will be independent with initial HEP.    Baseline issued at eval.    Time 2    Period Weeks    Status Achieved    Target Date 03/31/21      PT SHORT TERM GOAL #2   Title Patient will improve quadricep flexibility by 25% bilaterally to reduce stress on her knees when completing stair negotiation.    Baseline see flowsheet    Time 3  Period Weeks    Status On-going    Target Date 04/07/21      PT SHORT TERM GOAL #3   Title Patient will complete 5xSTS in <12 seconds to signify improvements in functional strength.    Baseline 11.1    Time 3    Period Weeks    Status Achieved    Target Date 04/07/21               PT Long Term Goals - 03/17/21 0931       PT LONG TERM GOAL #1   Title Patient will be able to complete stair ascent/descent utilizing reciprocal pattern.    Baseline Step to pattern    Time 6    Period Weeks    Status New    Target Date 04/28/21      PT LONG TERM GOAL #2   Title Patient will demonstrate 4+/5 strength in bilateral hip and knee musculature to improve stability about the chain for prolonged walking and standing.    Baseline see flowsheet    Time 6    Period Weeks    Status New    Target Date 04/28/21      PT LONG TERM GOAL #3   Title Patient will report tolerating at least 30 minutes of walking activity in order to return to walking as part of her exercise routine.    Baseline 10 minutes    Time 6    Period Weeks    Status New    Target Date 04/28/21                   Plan - 04/14/21 1853     Clinical  Impression Statement Patient able to complete 5xSTS in 11 seconds today having met this short term functional goal. Continued with CKC strengthening with patient having difficulty controlling descent of sit to stand. Began eccentric quadricep strengthening on the leg press with light weight with patient demonstrating good control and appropriate LE alignment. She tolerated session well today without reports of pain.    PT Treatment/Interventions ADLs/Self Care Home Management;Cryotherapy;Moist Heat;Gait training;Stair training;Therapeutic activities;Therapeutic exercise;Balance training;Neuromuscular re-education;Patient/family education;Manual techniques;Passive range of motion;Dry needling;Taping    PT Next Visit Plan hip strengthening, quad strengthening, quad stretching, stair training.    PT Home Exercise Plan Access Code [email or web address]    Consulted and Agree with Plan of Care Patient             Patient will benefit from skilled therapeutic intervention in order to improve the following deficits and impairments:  Difficulty walking, Decreased activity tolerance, Pain, Impaired flexibility, Decreased strength, Postural dysfunction, Improper body mechanics  Visit Diagnosis: Bilateral chronic knee pain  Muscle weakness (generalized)  Difficulty in walking, not elsewhere classified     Problem List Patient Active Problem List   Diagnosis Date Noted   Obstructive sleep apnea 12/23/2020   S/P abdominal hysterectomy 07/21/2020   Abnormal uterine bleeding (AUB)    Prediabetes 01/31/2020   Vitamin D deficiency 01/31/2020   Essential hypertension 01/30/2020   Fibroid 01/30/2020   Morbid obesity ([place]) 01/30/2020   Gwendolyn Grant, PT, DPT, ATC 04/14/21 7:18 PM   [place] [facility] [address] [place], [place], [address] Phone: [phone]   Fax:  [phone]  Name: Carolyn Mcdonald MRN: [id number] Date of Birth: [birth year]

## 2021-04-17 ENCOUNTER — Other Ambulatory Visit: Payer: Self-pay

## 2021-04-17 ENCOUNTER — Ambulatory Visit: Payer: Medicaid Other | Admitting: Physical Therapy

## 2021-04-17 ENCOUNTER — Encounter: Payer: Self-pay | Admitting: Physical Therapy

## 2021-04-17 DIAGNOSIS — M6281 Muscle weakness (generalized): Secondary | ICD-10-CM

## 2021-04-17 DIAGNOSIS — G8929 Other chronic pain: Secondary | ICD-10-CM

## 2021-04-17 DIAGNOSIS — M25561 Pain in right knee: Secondary | ICD-10-CM | POA: Diagnosis not present

## 2021-04-17 DIAGNOSIS — R262 Difficulty in walking, not elsewhere classified: Secondary | ICD-10-CM

## 2021-04-17 NOTE — Therapy (Signed)
[place], [place], [address] Phone: [phone]   Fax:  [phone]  Physical Therapy Treatment  Patient Details  Name: Carolyn Mcdonald MRN: [id number] Date of Birth: May 07, 1966 Referring Provider (PT): Nicolette Bang   Encounter Date: 04/17/2021   PT End of Session - 04/17/21 1043     Visit Number 8    Number of Visits 13    Date for PT Re-Evaluation 05/01/21    Authorization Type [employer]    Authorization Time Period auth pending    PT Start Time 1039   pt arrived late today.   PT Stop Time 1123    PT Time Calculation (min) 44 min    Activity Tolerance Patient tolerated treatment well    Behavior During Therapy [facility] for tasks assessed/performed             Past Medical History:  Diagnosis Date   Fibroid    Hypertension     Past Surgical History:  Procedure Laterality Date   ANKLE SURGERY     R ankle    CESAREAN SECTION     x3   HYSTERECTOMY ABDOMINAL WITH SALPINGECTOMY Bilateral 07/21/2020   Procedure: SUPRACERVICAL HYSTERECTOMY  WITH BILATERAL PARTIAL SALPINGECTOMY;  Surgeon: Mora Bellman, MD;  Location: [place];  Service: Gynecology;  Laterality: Bilateral;  tap block   SUPRACERVICAL ABDOMINAL HYSTERECTOMY  07/21/2020   TOE SURGERY     R Great toe    TUBAL LIGATION  1993    There were no vitals filed for this visit.   Subjective Assessment - 04/17/21 1044     Subjective "I was sore after seeing Sam last session. I do have that spasm inthe R thigh and R hip."    Patient Stated Goals "I want to exercise, I want to walk"    Currently in Pain? Yes    Pain Score 0-No pain    Pain Location Knee    Pain Orientation Right                [facility] PT Assessment - 04/17/21 0001       Assessment   Medical Diagnosis M17.0 (ICD-10-CM) - Bilateral primary osteoarthritis of knee    Referring Provider (PT) Nicolette Bang                            [facility] Adult PT Treatment/Exercise - 04/17/21 0001       Knee/Hip Exercises: Stretches   Hip Flexor Stretch 1 rep;Both;30 seconds   with glute set in standing   Other Knee/Hip Stretches adductor stretch 1 x 30 lateral lunge      Knee/Hip Exercises: Aerobic   Nustep 5 minutes level 5 UE/LE      Knee/Hip Exercises: Standing   Step Down 2 sets;15 reps;Step Height: 6"   RLE  only     Knee/Hip Exercises: Seated   Long Arc Quad Strengthening;Right;2 sets;15 reps;Weights   focus on eccentric load x 3 sec   Long Arc Quad Weight 4 lbs.    Sit to Sand 2 sets;10 reps   with 5 sec eccentric controlled lowering     Manual Therapy   Manual therapy comments DTM along the quad using tiger tail roller                      PT Short Term Goals - 04/14/21 1853  PT SHORT TERM GOAL #1   Title Patient will be independent with initial HEP.    Baseline issued at eval.    Time 2    Period Weeks    Status Achieved    Target Date 03/31/21      PT SHORT TERM GOAL #2   Title Patient will improve quadricep flexibility by 25% bilaterally to reduce stress on her knees when completing stair negotiation.    Baseline see flowsheet    Time 3    Period Weeks    Status On-going    Target Date 04/07/21      PT SHORT TERM GOAL #3   Title Patient will complete 5xSTS in <12 seconds to signify improvements in functional strength.    Baseline 11.1    Time 3    Period Weeks    Status Achieved    Target Date 04/07/21               PT Long Term Goals - 03/17/21 0931       PT LONG TERM GOAL #1   Title Patient will be able to complete stair ascent/descent utilizing reciprocal pattern.    Baseline Step to pattern    Time 6    Period Weeks    Status New    Target Date 04/28/21      PT LONG TERM GOAL #2   Title Patient will demonstrate 4+/5 strength in bilateral hip and knee musculature to improve stability about the chain for prolonged walking and  standing.    Baseline see flowsheet    Time 6    Period Weeks    Status New    Target Date 04/28/21      PT LONG TERM GOAL #3   Title Patient will report tolerating at least 30 minutes of walking activity in order to return to walking as part of her exercise routine.    Baseline 10 minutes    Time 6    Period Weeks    Status New    Target Date 04/28/21                   Plan - 04/17/21 1115     Clinical Impression Statement pt arrives reporting that she was sore after the last session but notes no pain just some sensation of cramping in the hip / knee. reviewed self DTM using the roller and how it can be done at home. continued working on hip/ knee strength with emphasis on eccentric loading. She did require cues for foot/ knee alignment with step down but was able to correct. end of session she reported a reduction in the twitching/ cramping sensation in the hip.             Patient will benefit from skilled therapeutic intervention in order to improve the following deficits and impairments:  Difficulty walking, Decreased activity tolerance, Pain, Impaired flexibility, Decreased strength, Postural dysfunction, Improper body mechanics  Visit Diagnosis: Bilateral chronic knee pain  Muscle weakness (generalized)  Difficulty in walking, not elsewhere classified     Problem List Patient Active Problem List   Diagnosis Date Noted   Obstructive sleep apnea 12/23/2020   S/P abdominal hysterectomy 07/21/2020   Abnormal uterine bleeding (AUB)    Prediabetes 01/31/2020   Vitamin D deficiency 01/31/2020   Essential hypertension 01/30/2020   Fibroid 01/30/2020   Morbid obesity ([place]) 01/30/2020    Ura Yingling PT, DPT, LAT, ATC  04/17/21  11:29 AM  [place], [place], [address] Phone: [phone]   Fax:  [phone]  Name: Carolyn Mcdonald MRN: [id number] Date of Birth:  1966-03-16

## 2021-04-19 ENCOUNTER — Other Ambulatory Visit: Payer: Self-pay

## 2021-04-19 ENCOUNTER — Ambulatory Visit: Payer: Medicaid Other | Admitting: Physician Assistant

## 2021-04-19 VITALS — BP 156/97 | HR 96 | Resp 16 | Ht 63.0 in | Wt 313.0 lb

## 2021-04-19 DIAGNOSIS — Z13220 Encounter for screening for lipoid disorders: Secondary | ICD-10-CM | POA: Diagnosis not present

## 2021-04-19 DIAGNOSIS — Z6841 Body Mass Index (BMI) 40.0 and over, adult: Secondary | ICD-10-CM | POA: Diagnosis not present

## 2021-04-19 DIAGNOSIS — I10 Essential (primary) hypertension: Secondary | ICD-10-CM

## 2021-04-19 DIAGNOSIS — Z1159 Encounter for screening for other viral diseases: Secondary | ICD-10-CM

## 2021-04-19 DIAGNOSIS — Z86718 Personal history of other venous thrombosis and embolism: Secondary | ICD-10-CM

## 2021-04-19 MED ORDER — RIVAROXABAN 20 MG PO TABS
ORAL_TABLET | Freq: Every day | ORAL | 1 refills | Status: DC
  Filled 2021-04-19: qty 90, 90d supply, fill #0
  Filled 2021-07-20: qty 90, 90d supply, fill #1

## 2021-04-19 NOTE — Progress Notes (Signed)
Established Patient Office Visit  Subjective:  Patient ID: Carolyn Mcdonald, female    DOB: [birth year]  Age: 55 y.o. MRN: [id number]  CC:  Chief Complaint  Patient presents with   Medication Refill    HPI [name] requests a refill of her Xarelto.  Reports that she takes this due to history of DVT.  Reports that she has an appointment upcoming with a new primary care provider at [facility].  Reports that she does check her blood pressure at home, states that it is generally within normal limits.  Denies any hypertensive symptoms.  Past Medical History:  Diagnosis Date   Fibroid    Hypertension     Past Surgical History:  Procedure Laterality Date   ANKLE SURGERY     R ankle    CESAREAN SECTION     x3   HYSTERECTOMY ABDOMINAL WITH SALPINGECTOMY Bilateral 07/21/2020   Procedure: SUPRACERVICAL HYSTERECTOMY  WITH BILATERAL PARTIAL SALPINGECTOMY;  Surgeon: Mora Bellman, MD;  Location: [place];  Service: Gynecology;  Laterality: Bilateral;  tap block   SUPRACERVICAL ABDOMINAL HYSTERECTOMY  07/21/2020   TOE SURGERY     R Great toe    TUBAL LIGATION  1993    History reviewed. No pertinent family history.  Social History   Socioeconomic History   Marital status: Married    Spouse name: Not on file   Number of children: Not on file   Years of education: Not on file   Highest education level: Not on file  Occupational History   Not on file  Tobacco Use   Smoking status: Never   Smokeless tobacco: Never  Vaping Use   Vaping Use: Never used  Substance and Sexual Activity   Alcohol use: Never   Drug use: Never   Sexual activity: Not Currently    Birth control/protection: Surgical  Other Topics Concern   Not on file  Social History Narrative   Not on file   Social Determinants of Health   Financial Resource Strain: Not on file  Food Insecurity: Not on file  Transportation Needs: Not on file  Physical Activity: Not on file  Stress: Not on  file  Social Connections: Not on file  Intimate Partner Violence: Not on file    Outpatient Medications Prior to Visit  Medication Sig Dispense Refill   acetaminophen (TYLENOL) 650 MG CR tablet Take 650 mg by mouth every 8 (eight) hours as needed for pain.     hydrochlorothiazide (HYDRODIURIL) 25 MG tablet Take 1 tablet (25 mg total) by mouth daily. 90 tablet 0   losartan (COZAAR) 50 MG tablet TAKE 1 TABLET BY MOUTH DAILY. 90 tablet 1   Multiple Vitamin (MULTIVITAMIN) tablet Take 1 tablet by mouth daily.     rivaroxaban (XARELTO) 20 MG TABS tablet TAKE 1 TABLET (20 MG TOTAL) BY MOUTH DAILY WITH SUPPER. 90 tablet 1   chlorhexidine (HIBICLENS) 4 % external liquid Apply topically daily as needed. 120 mL 0   Misc. Devices MISC 1 each by Does not apply route at bedtime. - Trial of CPAP therapy on 11 cm H2O or autopap 5-15. - Patient used a Medium Wide size Philips Respironics Full Face Mask Dreamwear mask and heated humidification. 1 each 0   No facility-administered medications prior to visit.    Allergies  Allergen Reactions   Vicodin [Hydrocodone-Acetaminophen] Hives    ROS Review of Systems  Constitutional: Negative.   HENT: Negative.    Eyes: Negative.   Respiratory:  Negative for shortness of breath.   Cardiovascular:  Negative for chest pain.  Gastrointestinal: Negative.   Endocrine: Negative.   Genitourinary: Negative.   Musculoskeletal: Negative.   Skin: Negative.   Allergic/Immunologic: Negative.   Neurological: Negative.   Hematological: Negative.   Psychiatric/Behavioral: Negative.       Objective:    Physical Exam Vitals and nursing note reviewed.  Constitutional:      Appearance: Normal appearance.  HENT:     Head: Normocephalic and atraumatic.     Right Ear: External ear normal.     Left Ear: External ear normal.     Nose: Nose normal.     Mouth/Throat:     Mouth: Mucous membranes are moist.     Pharynx: Oropharynx is clear.  Eyes:     Extraocular  Movements: Extraocular movements intact.     Conjunctiva/sclera: Conjunctivae normal.     Pupils: Pupils are equal, round, and reactive to light.  Cardiovascular:     Rate and Rhythm: Normal rate and regular rhythm.     Pulses: Normal pulses.     Heart sounds: Normal heart sounds.  Pulmonary:     Effort: Pulmonary effort is normal.     Breath sounds: Normal breath sounds.  Musculoskeletal:        General: Normal range of motion.     Cervical back: Normal range of motion and neck supple.  Skin:    General: Skin is warm.  Neurological:     General: No focal deficit present.     Mental Status: She is alert and oriented to person, place, and time.  Psychiatric:        Mood and Affect: Mood normal.        Behavior: Behavior normal.        Thought Content: Thought content normal.        Judgment: Judgment normal.    BP (!) 156/97 (BP Location: Left Arm, Patient Position: Sitting, Cuff Size: Large)   Pulse 96   Resp 16   Ht '5\' 3"'$  (1.6 m)   Wt (!) 313 lb (142 kg)   LMP 05/13/2020   SpO2 100%   BMI 55.45 kg/m  Wt Readings from Last 3 Encounters:  04/19/21 (!) 313 lb (142 kg)  02/12/21 (!) 324 lb (147 kg)  12/16/20 (!) 312 lb (141.5 kg)     Health Maintenance Due  Topic Date Due   Hepatitis C Screening  Never done   TETANUS/TDAP  Never done   COLONOSCOPY (Pts 45-77yr Insurance coverage will need to be confirmed)  Never done   Zoster Vaccines- Shingrix (1 of 2) Never done   COVID-19 Vaccine (2 - Pfizer series) 09/15/2020   INFLUENZA VACCINE  04/12/2021    There are no preventive care reminders to display for this patient.  Lab Results  Component Value Date   TSH 1.220 01/30/2020   Lab Results  Component Value Date   WBC 8.4 07/22/2020   HGB 9.1 (L) 07/22/2020   HCT 28.7 (L) 07/22/2020   MCV 82.9 07/22/2020   PLT 250 07/22/2020   Lab Results  Component Value Date   NA 139 07/15/2020   K 3.2 (L) 07/15/2020   CO2 27 07/15/2020   GLUCOSE 98 07/15/2020   BUN 14  07/15/2020   CREATININE 0.81 07/15/2020   BILITOT 0.6 04/05/2020   ALKPHOS 57 04/05/2020   AST 17 04/05/2020   ALT 15 04/05/2020   PROT 7.0 04/05/2020   ALBUMIN 3.1 (L) 04/05/2020  CALCIUM 9.2 07/15/2020   ANIONGAP 9 07/15/2020   No results found for: CHOL No results found for: HDL No results found for: LDLCALC No results found for: TRIG No results found for: CHOLHDL Lab Results  Component Value Date   HGBA1C 5.8 (H) 01/30/2020      Assessment & Plan:   Problem List Items Addressed This Visit       Cardiovascular and Mediastinum   Essential hypertension   Relevant Medications   rivaroxaban (XARELTO) 20 MG TABS tablet   Other Relevant Orders   CBC with Differential/Platelet   Comp. Metabolic Panel (12)   TSH     Other   Class 3 severe obesity due to excess calories with serious comorbidity and body mass index (BMI) of 50.0 to 59.9 in adult [facility])   History of DVT of lower extremity - Primary   Relevant Medications   rivaroxaban (XARELTO) 20 MG TABS tablet   Other Visit Diagnoses     Screening, lipid       Relevant Orders   Lipid panel   Encounter for HCV screening test for low risk patient       Relevant Orders   HCV Ab w Reflex to Quant PCR       Meds ordered this encounter  Medications   rivaroxaban (XARELTO) 20 MG TABS tablet    Sig: TAKE 1 TABLET (20 MG TOTAL) BY MOUTH DAILY WITH SUPPER.    Dispense:  90 tablet    Refill:  1    Order Specific Question:   Supervising Provider    Answer:   WRIGHT, PATRICK E [[id number]]  1. History of DVT of lower extremity Continue current regimen  Patient to return to mobile unit to have fasting labs completed.  Red flags given for prompt reevaluation. - rivaroxaban (XARELTO) 20 MG TABS tablet; TAKE 1 TABLET (20 MG TOTAL) BY MOUTH DAILY WITH SUPPER.  Dispense: 90 tablet; Refill: 1  2. Essential hypertension Patient encouraged to continue checking blood pressure at home, keeping a written log and have available for all  office visits. - CBC with Differential/Platelet; Future - Comp. Metabolic Panel (12); Future - TSH; Future  3. Screening, lipid  - Lipid panel; Future  4. Encounter for HCV screening test for low risk patient  - HCV Ab w Reflex to Quant PCR; Future  5. Class 3 severe obesity due to excess calories with serious comorbidity and body mass index (BMI) of 50.0 to 59.9 in adult [facility])    I have reviewed the patient's medical history (PMH, PSH, Social History, Family History, Medications, and allergies) , and have been updated if relevant. I spent 30 minutes reviewing chart and  face to face time with patient.    Follow-up: Return if symptoms worsen or fail to improve.    Loraine Grip Mayers, PA-C

## 2021-04-19 NOTE — Progress Notes (Signed)
Needs refill on xarelto Off medication x 4 days

## 2021-04-19 NOTE — Patient Instructions (Signed)
Please return to the mobile unit to have your fasting labs completed.  Kennieth Rad, PA-C Physician Assistant [facility] [email or web address]   Health Maintenance, Female Adopting a healthy lifestyle and getting preventive care are important in promoting health and wellness. Ask your health care provider about: The right schedule for you to have regular tests and exams. Things you can do on your own to prevent diseases and keep yourself healthy. What should I know about diet, weight, and exercise? Eat a healthy diet  Eat a diet that includes plenty of vegetables, fruits, low-fat dairy products, and lean protein. Do not eat a lot of foods that are high in solid fats, added sugars, or sodium.  Maintain a healthy weight Body mass index (BMI) is used to identify weight problems. It estimates body fat based on height and weight. Your health care provider can help determineyour BMI and help you achieve or maintain a healthy weight. Get regular exercise Get regular exercise. This is one of the most important things you can do for your health. Most adults should: Exercise for at least 150 minutes each week. The exercise should increase your heart rate and make you sweat (moderate-intensity exercise). Do strengthening exercises at least twice a week. This is in addition to the moderate-intensity exercise. Spend less time sitting. Even light physical activity can be beneficial. Watch cholesterol and blood lipids Have your blood tested for lipids and cholesterol at 56 years of age, then havethis test every 5 years. Have your cholesterol levels checked more often if: Your lipid or cholesterol levels are high. You are older than 56 years of age. You are at high risk for heart disease. What should I know about cancer screening? Depending on your health history and family history, you may need to have cancer screening at various ages. This may  include screening for: Breast cancer. Cervical cancer. Colorectal cancer. Skin cancer. Lung cancer. What should I know about heart disease, diabetes, and high blood pressure? Blood pressure and heart disease High blood pressure causes heart disease and increases the risk of stroke. This is more likely to develop in people who have high blood pressure readings, are of African descent, or are overweight. Have your blood pressure checked: Every 3-5 years if you are 66-37 years of age. Every year if you are 50 years old or older. Diabetes Have regular diabetes screenings. This checks your fasting blood sugar level. Have the screening done: Once every three years after age 44 if you are at a normal weight and have a low risk for diabetes. More often and at a younger age if you are overweight or have a high risk for diabetes. What should I know about preventing infection? Hepatitis B If you have a higher risk for hepatitis B, you should be screened for this virus. Talk with your health care provider to find out if you are at risk forhepatitis B infection. Hepatitis C Testing is recommended for: Everyone born from [birth year] through 1966. Anyone with known risk factors for hepatitis C. Sexually transmitted infections (STIs) Get screened for STIs, including gonorrhea and chlamydia, if: You are sexually active and are younger than 56 years of age. You are older than 56 years of age and your health care provider tells you that you are at risk for this type of infection. Your sexual activity has changed since you were last screened, and you are at increased risk for chlamydia or gonorrhea. Ask your health care provider if you are at  risk. Ask your health care provider about whether you are at high risk for HIV. Your health care provider may recommend a prescription medicine to help prevent HIV infection. If you choose to take medicine to prevent HIV, you should first get tested for HIV. You should then be  tested every 3 months for as long as you are taking the medicine. Pregnancy If you are about to stop having your period (premenopausal) and you may become pregnant, seek counseling before you get pregnant. Take 400 to 800 micrograms (mcg) of folic acid every day if you become pregnant. Ask for birth control (contraception) if you want to prevent pregnancy. Osteoporosis and menopause Osteoporosis is a disease in which the bones lose minerals and strength with aging. This can result in bone fractures. If you are 32 years old or older, or if you are at risk for osteoporosis and fractures, ask your health care provider if you should: Be screened for bone loss. Take a calcium or vitamin D supplement to lower your risk of fractures. Be given hormone replacement therapy (HRT) to treat symptoms of menopause. Follow these instructions at home: Lifestyle Do not use any products that contain nicotine or tobacco, such as cigarettes, e-cigarettes, and chewing tobacco. If you need help quitting, ask your health care provider. Do not use street drugs. Do not share needles. Ask your health care provider for help if you need support or information about quitting drugs. Alcohol use Do not drink alcohol if: Your health care provider tells you not to drink. You are pregnant, may be pregnant, or are planning to become pregnant. If you drink alcohol: Limit how much you use to 0-1 drink a day. Limit intake if you are breastfeeding. Be aware of how much alcohol is in your drink. In the [place] one drink equals one 12 oz bottle of beer (355 mL), one 5 oz glass of wine (148 mL), or one 1 oz glass of hard liquor (44 mL). General instructions Schedule regular health, dental, and eye exams. Stay current with your vaccines. Tell your health care provider if: You often feel depressed. You have ever been abused or do not feel safe at home. Summary Adopting a healthy lifestyle and getting preventive care are important  in promoting health and wellness. Follow your health care provider's instructions about healthy diet, exercising, and getting tested or screened for diseases. Follow your health care provider's instructions on monitoring your cholesterol and blood pressure. This information is not intended to replace advice given to you by your health care provider. Make sure you discuss any questions you have with your healthcare provider. Document Revised: 08/22/2018 Document Reviewed: 08/22/2018 Elsevier Patient Education  2022 [employer].

## 2021-04-20 DIAGNOSIS — Z86718 Personal history of other venous thrombosis and embolism: Secondary | ICD-10-CM

## 2021-04-20 HISTORY — DX: Personal history of other venous thrombosis and embolism: Z86.718

## 2021-04-21 ENCOUNTER — Other Ambulatory Visit: Payer: Self-pay

## 2021-04-22 ENCOUNTER — Ambulatory Visit: Payer: Medicaid Other

## 2021-04-22 ENCOUNTER — Other Ambulatory Visit: Payer: Self-pay

## 2021-04-22 DIAGNOSIS — M6281 Muscle weakness (generalized): Secondary | ICD-10-CM

## 2021-04-22 DIAGNOSIS — M25561 Pain in right knee: Secondary | ICD-10-CM | POA: Diagnosis not present

## 2021-04-22 DIAGNOSIS — R262 Difficulty in walking, not elsewhere classified: Secondary | ICD-10-CM

## 2021-04-22 DIAGNOSIS — G8929 Other chronic pain: Secondary | ICD-10-CM

## 2021-04-22 NOTE — Therapy (Signed)
[place], [place], [address] Phone: [phone]   Fax:  [phone]  Physical Therapy Treatment  Patient Details  Name: Carolyn Mcdonald MRN: [id number] Date of Birth: [birth year] Referring Provider (PT): Nicolette Bang   Encounter Date: 04/22/2021   PT End of Session - 04/22/21 1843     Visit Number 9    Number of Visits 13    Date for PT Re-Evaluation 05/01/21    Authorization Type [employer]    Authorization Time Period auth pending    PT Start Time 1845   patient late   PT Stop Time 1915    PT Time Calculation (min) 30 min    Activity Tolerance Patient tolerated treatment well    Behavior During Therapy [facility] for tasks assessed/performed             Past Medical History:  Diagnosis Date   Fibroid    Hypertension     Past Surgical History:  Procedure Laterality Date   ANKLE SURGERY     R ankle    CESAREAN SECTION     x3   HYSTERECTOMY ABDOMINAL WITH SALPINGECTOMY Bilateral 07/21/2020   Procedure: SUPRACERVICAL HYSTERECTOMY  WITH BILATERAL PARTIAL SALPINGECTOMY;  Surgeon: Mora Bellman, MD;  Location: [place];  Service: Gynecology;  Laterality: Bilateral;  tap block   SUPRACERVICAL ABDOMINAL HYSTERECTOMY  07/21/2020   TOE SURGERY     R Great toe    TUBAL LIGATION  1993    There were no vitals filed for this visit.   Subjective Assessment - 04/22/21 1844     Subjective "I have been sitting all day so I'm stiff. Still feel like a muscle spasm in my hip."    Currently in Pain? No/denies                [facility] PT Assessment - 04/22/21 0001       Flexibility   Quadriceps PKB 85 Lt 80 Rt                           [facility] Adult PT Treatment/Exercise - 04/22/21 0001       Knee/Hip Exercises: Stretches   Sports administrator 60 seconds    Quad Stretch Limitations bilateral with strap      Knee/Hip Exercises: Aerobic   Nustep 5 minutes level 5 UE/LE       Knee/Hip Exercises: Machines for Strengthening   Total Gym Leg Press eccentric RLE 2 x 10 @ 20 lbs      Knee/Hip Exercises: Standing   Step Down --   attempted on RLE, unable due to spasm   Other Standing Knee Exercises standing march 20 reps    Other Standing Knee Exercises sumo squat x5                      PT Short Term Goals - 04/14/21 1853       PT SHORT TERM GOAL #1   Title Patient will be independent with initial HEP.    Baseline issued at eval.    Time 2    Period Weeks    Status Achieved    Target Date 03/31/21      PT SHORT TERM GOAL #2   Title Patient will improve quadricep flexibility by 25% bilaterally to reduce stress on her knees when completing stair negotiation.    Baseline see flowsheet    Time 3  Period Weeks    Status On-going    Target Date 04/07/21      PT SHORT TERM GOAL #3   Title Patient will complete 5xSTS in <12 seconds to signify improvements in functional strength.    Baseline 11.1    Time 3    Period Weeks    Status Achieved    Target Date 04/07/21               PT Long Term Goals - 03/17/21 0931       PT LONG TERM GOAL #1   Title Patient will be able to complete stair ascent/descent utilizing reciprocal pattern.    Baseline Step to pattern    Time 6    Period Weeks    Status New    Target Date 04/28/21      PT LONG TERM GOAL #2   Title Patient will demonstrate 4+/5 strength in bilateral hip and knee musculature to improve stability about the chain for prolonged walking and standing.    Baseline see flowsheet    Time 6    Period Weeks    Status New    Target Date 04/28/21      PT LONG TERM GOAL #3   Title Patient will report tolerating at least 30 minutes of walking activity in order to return to walking as part of her exercise routine.    Baseline 10 minutes    Time 6    Period Weeks    Status New    Target Date 04/28/21                   Plan - 04/22/21 1848     Clinical Impression  Statement Session was limited as she was late for appointment. Quadriceps flexibility has remained relatively unchanged since last assessment with significant tightness remaining bilaterally. She reports no pain upon arrival, but continues to have feeling of muscle spasm in the hip. Attempted step down with the RLE though she reports due to her spasm she is unable to perform/can't coordinate the movement. Though she was unable to complete eccentric loading with step downs, she was able to perform RLE eccentric squat on the leg press without issue. Heavy cues required for proper form with sumo squat with patient able to minimally correct with continued cues, so this was discontinued due to poor form.    PT Treatment/Interventions ADLs/Self Care Home Management;Cryotherapy;Moist Heat;Gait training;Stair training;Therapeutic activities;Therapeutic exercise;Balance training;Neuromuscular re-education;Patient/family education;Manual techniques;Passive range of motion;Dry needling;Taping    PT Next Visit Plan anticipate D/C    PT Home Exercise Plan Access Code [email or web address]             Patient will benefit from skilled therapeutic intervention in order to improve the following deficits and impairments:  Difficulty walking, Decreased activity tolerance, Pain, Impaired flexibility, Decreased strength, Postural dysfunction, Improper body mechanics  Visit Diagnosis: Bilateral chronic knee pain  Muscle weakness (generalized)  Difficulty in walking, not elsewhere classified     Problem List Patient Active Problem List   Diagnosis Date Noted   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   Obstructive sleep apnea 12/23/2020   S/P abdominal hysterectomy 07/21/2020   Abnormal uterine bleeding (AUB)    Prediabetes 01/31/2020   Vitamin D deficiency 01/31/2020   Essential hypertension 01/30/2020   Fibroid 01/30/2020   Class 3 severe obesity due to excess calories with serious comorbidity and body mass  index (BMI) of 50.0 to 59.9 in adult [facility]) 01/30/2020  Aldona Bar  Channing Yeager, PT, DPT, ATC 04/22/21 7:18 PM   [facility] [address] [place], [place], [address] Phone: [phone]   Fax:  [phone]  Name: Carolyn Mcdonald MRN: [id number] Date of Birth: 02-Aug-1966

## 2021-04-24 ENCOUNTER — Other Ambulatory Visit: Payer: Self-pay

## 2021-04-24 ENCOUNTER — Encounter: Payer: Self-pay | Admitting: Physical Therapy

## 2021-04-24 ENCOUNTER — Ambulatory Visit: Payer: Medicaid Other | Admitting: Physical Therapy

## 2021-04-24 DIAGNOSIS — M25562 Pain in left knee: Secondary | ICD-10-CM

## 2021-04-24 DIAGNOSIS — R262 Difficulty in walking, not elsewhere classified: Secondary | ICD-10-CM

## 2021-04-24 DIAGNOSIS — M25561 Pain in right knee: Secondary | ICD-10-CM | POA: Diagnosis not present

## 2021-04-24 DIAGNOSIS — M6281 Muscle weakness (generalized): Secondary | ICD-10-CM

## 2021-04-24 DIAGNOSIS — G8929 Other chronic pain: Secondary | ICD-10-CM

## 2021-04-24 NOTE — Patient Instructions (Signed)
Access Code: [email or web address] URL: [email or web address] Date: 04/24/2021 Prepared by: Starr Lake  Exercises Modified Marcello Moores Stretch - 1 x daily - 7 x weekly - 3 sets - 30 sec hold Supine Active Straight Leg Raise - 1 x daily - 7 x weekly - 2 sets - 10 reps Sidelying Hip Abduction - 1 x daily - 7 x weekly - 2 sets - 10 reps Prone Quadriceps Stretch with Strap - 1 x daily - 7 x weekly - 3 sets - 30 sec hold Supine Figure 4 Piriformis Stretch - 1 x daily - 7 x weekly - 3 sets - 30 sec hold Clamshell with Resistance - 1 x daily - 7 x weekly - 3 sets - 10 reps Prone Hip Extension - 1 x daily - 7 x weekly - 3 sets - 10 reps Gastroc Stretch on Wall - 1 x daily - 7 x weekly - 3 sets - 30 sec hold Standing Hip Abduction with Resistance at Ankles and Counter Support - 1 x daily - 7 x weekly - 2 sets - 10 reps Standing Hip Extension with Resistance at Ankles and Counter Support - 1 x daily - 7 x weekly - 2 sets - 10 reps Sit to Stand - 1 x daily - 7 x weekly - 2 sets - 10 reps

## 2021-04-24 NOTE — Therapy (Signed)
[place], [place], [address] Phone: [phone]   Fax:  [phone]  Physical Therapy Treatment / discharge  Patient Details  Name: Carolyn Mcdonald MRN: [id number] Date of Birth: 05-01-1966 Referring Provider (PT): Nicolette Bang   Encounter Date: 04/24/2021   PT End of Session - 04/24/21 0907     Visit Number 10    Number of Visits 13    Authorization Type [employer]    PT Start Time 0906    PT Stop Time 0935    PT Time Calculation (min) 29 min    Activity Tolerance Patient tolerated treatment well    Behavior During Therapy [facility] for tasks assessed/performed             Past Medical History:  Diagnosis Date   Fibroid    Hypertension     Past Surgical History:  Procedure Laterality Date   ANKLE SURGERY     R ankle    CESAREAN SECTION     x3   HYSTERECTOMY ABDOMINAL WITH SALPINGECTOMY Bilateral 07/21/2020   Procedure: SUPRACERVICAL HYSTERECTOMY  WITH BILATERAL PARTIAL SALPINGECTOMY;  Surgeon: Mora Bellman, MD;  Location: [place];  Service: Gynecology;  Laterality: Bilateral;  tap block   SUPRACERVICAL ABDOMINAL HYSTERECTOMY  07/21/2020   TOE SURGERY     R Great toe    TUBAL LIGATION  1993    There were no vitals filed for this visit.   Subjective Assessment - 04/24/21 0908     Subjective " I am doing pretty good, I am still havimg some spasm in the R leg but I think its from the leg press the other session. I haven't had much sleep so I am pretty exhausted."    Currently in Pain? No/denies                [facility] PT Assessment - 04/24/21 0001       Assessment   Medical Diagnosis M17.0 (ICD-10-CM) - Bilateral primary osteoarthritis of knee    Referring Provider (PT) Nicolette Bang      Strength   Right Hip Flexion 5/5    Right Hip ABduction 4+/5    Left Hip Flexion 5/5    Left Hip ABduction 4+/5    Right Knee Flexion 5/5    Right Knee Extension  4+/5    Left Knee Flexion 5/5    Left Knee Extension 5/5                           [facility] Adult PT Treatment/Exercise - 04/24/21 0001       Knee/Hip Exercises: Standing   Hip Abduction 1 set;10 reps;Both;Stengthening   with RTB around ankles   Hip Extension 1 set;10 reps;Knee straight   with RTB     Knee/Hip Exercises: Seated   Sit to Sand 1 set;10 reps                    PT Education - 04/24/21 0930     Education Details reviewed and updated HEP today. discussed proper progression of strengthening and increased reps/ sets and resistance gradually to promote strength/ endurance.    Person(s) Educated Patient    Methods Explanation;Handout    Comprehension Verbalized understanding;Verbal cues required              PT Short Term Goals - 04/24/21 0910       PT SHORT TERM GOAL #  1   Title Patient will be independent with initial HEP.    Status Achieved      PT SHORT TERM GOAL #2   Title Patient will improve quadricep flexibility by 25% bilaterally to reduce stress on her knees when completing stair negotiation.    Period Weeks    Status Achieved      PT SHORT TERM GOAL #3   Title Patient will complete 5xSTS in <12 seconds to signify improvements in functional strength.    Status Achieved               PT Long Term Goals - 04/24/21 0910       PT LONG TERM GOAL #1   Title Patient will be able to complete stair ascent/descent utilizing reciprocal pattern.    Baseline able to performed step through pattern in  clinic but notes the stairs at home are too deep making it more challenging.    Status Achieved      PT LONG TERM GOAL #2   Title Patient will demonstrate 4+/5 strength in bilateral hip and knee musculature to improve stability about the chain for prolonged walking and standing.    Status Achieved      PT LONG TERM GOAL #3   Title Patient will report tolerating at least 30 minutes of walking activity in order to return to  walking as part of her exercise routine.    Period Weeks    Status Achieved                   Plan - 04/24/21 0931     Clinical Impression Statement Carolyn Mcdonald has made great progress with physical therapy and has increased her hip/ knee strength and additionally reports no pain today. she does continue to report R quad spasm intermittently but reports it oculd be related to increased stress at home. She met all goals today and reviewed HEP which she had no questions with. she is able to maintain and progress her current LOF IND and will be discharged from PT today.    PT Treatment/Interventions ADLs/Self Care Home Management;Cryotherapy;Moist Heat;Gait training;Stair training;Therapeutic activities;Therapeutic exercise;Balance training;Neuromuscular re-education;Patient/family education;Manual techniques;Passive range of motion;Dry needling;Taping    PT Next Visit Plan d/C    PT Home Exercise Plan Access Code [email or web address]    Consulted and Agree with Plan of Care Patient             Patient will benefit from skilled therapeutic intervention in order to improve the following deficits and impairments:  Difficulty walking, Decreased activity tolerance, Pain, Impaired flexibility, Decreased strength, Postural dysfunction, Improper body mechanics  Visit Diagnosis: Bilateral chronic knee pain  Muscle weakness (generalized)  Difficulty in walking, not elsewhere classified     Problem List Patient Active Problem List   Diagnosis Date Noted   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   Obstructive sleep apnea 12/23/2020   S/P abdominal hysterectomy 07/21/2020   Abnormal uterine bleeding (AUB)    Prediabetes 01/31/2020   Vitamin D deficiency 01/31/2020   Essential hypertension 01/30/2020   Fibroid 01/30/2020   Class 3 severe obesity due to excess calories with serious comorbidity and body mass index (BMI) of 50.0 to 59.9 in adult [facility]) 01/30/2020    [place] 04/24/2021, 9:42 AM  [facility] [address] [place], [place], [address] Phone: [phone]   Fax:  [phone]  Name: Carolyn Mcdonald MRN: [id number] Date of Birth: 1966-02-17  PHYSICAL THERAPY DISCHARGE SUMMARY  Visits from Start of Care: 10  Current functional level related to goals / functional outcomes: See goals   Remaining deficits: See assessment   Education / Equipment: HEP, theraband, posture and lifting mechancis   Patient agrees to discharge. Patient goals were met. Patient is being discharged due to meeting the stated rehab goals.  Kristoffer Leamon PT, DPT, LAT, ATC  04/24/21  9:42 AM     

## 2021-04-29 ENCOUNTER — Ambulatory Visit: Payer: Medicaid Other | Admitting: Family Medicine

## 2021-04-29 ENCOUNTER — Other Ambulatory Visit: Payer: Self-pay

## 2021-04-29 ENCOUNTER — Encounter: Payer: Self-pay | Admitting: Family Medicine

## 2021-04-29 VITALS — BP 122/85 | HR 96 | Temp 98.3°F | Resp 18 | Ht 62.99 in | Wt 314.0 lb

## 2021-04-29 DIAGNOSIS — Z6841 Body Mass Index (BMI) 40.0 and over, adult: Secondary | ICD-10-CM

## 2021-04-29 DIAGNOSIS — I10 Essential (primary) hypertension: Secondary | ICD-10-CM | POA: Diagnosis not present

## 2021-04-29 DIAGNOSIS — F341 Dysthymic disorder: Secondary | ICD-10-CM | POA: Diagnosis not present

## 2021-04-29 DIAGNOSIS — M170 Bilateral primary osteoarthritis of knee: Secondary | ICD-10-CM

## 2021-04-29 NOTE — Progress Notes (Signed)
Pt presents for hypertension and prediabetes follow-up, pt reports bilateral hand pain around thumb area Pt wants to speak w/counselor dealing with a lot of grief due to death

## 2021-04-29 NOTE — Progress Notes (Signed)
Established Patient Office Visit  Subjective:  Patient ID: Carolyn Mcdonald, female    DOB: 07/06/1966  Age: 55 y.o. MRN: [id number]  CC:  Chief Complaint  Patient presents with   Hypertension   Prediabetes    HPI Carolyn Mcdonald presents for follow up of chronic med issues. Patient complains of desiring weight loss and how it is affecting her chronic knee pain. She also reports increased social stressors.   Past Medical History:  Diagnosis Date   Fibroid    Hypertension     Social History   Socioeconomic History   Marital status: Married    Spouse name: Not on file   Number of children: Not on file   Years of education: Not on file   Highest education level: Not on file  Occupational History   Not on file  Tobacco Use   Smoking status: Never   Smokeless tobacco: Never  Vaping Use   Vaping Use: Never used  Substance and Sexual Activity   Alcohol use: Never   Drug use: Never   Sexual activity: Not Currently    Birth control/protection: Surgical  Other Topics Concern   Not on file  Social History Narrative   Not on file   Social Determinants of Health   Financial Resource Strain: Not on file  Food Insecurity: Not on file  Transportation Needs: Not on file  Physical Activity: Not on file  Stress: Not on file  Social Connections: Not on file  Intimate Partner Violence: Not on file       Allergies  Allergen Reactions   Vicodin [Hydrocodone-Acetaminophen] Hives    ROS Review of Systems  All other systems reviewed and are negative.    Objective:    Physical Exam Vitals and nursing note reviewed.  Constitutional:      General: She is not in acute distress.    Appearance: She is obese.  Cardiovascular:     Rate and Rhythm: Normal rate and regular rhythm.  Pulmonary:     Effort: Pulmonary effort is normal.     Breath sounds: Normal breath sounds.  Abdominal:     Palpations: Abdomen is soft.     Tenderness: There is no abdominal tenderness.   Musculoskeletal:     Right lower leg: No edema.     Left lower leg: No edema.  Neurological:     General: No focal deficit present.     Mental Status: She is alert and oriented to person, place, and time.    BP 122/85 (BP Location: Left Arm, Patient Position: Sitting, Cuff Size: Large)   Pulse 96   Temp 98.3 F (36.8 C)   Resp 18   Ht 5' 2.99" (1.6 m)   Wt (!) 314 lb (142.4 kg)   LMP 05/13/2020   SpO2 97%   BMI 55.64 kg/m  Wt Readings from Last 3 Encounters:  04/29/21 (!) 314 lb (142.4 kg)  04/19/21 (!) 313 lb (142 kg)  02/12/21 (!) 324 lb (147 kg)     Health Maintenance Due  Topic Date Due   Hepatitis C Screening  Never done   TETANUS/TDAP  Never done   COLONOSCOPY (Pts 45-78yr Insurance coverage will need to be confirmed)  Never done   Zoster Vaccines- Shingrix (1 of 2) Never done   COVID-19 Vaccine (2 - Pfizer series) 09/15/2020   INFLUENZA VACCINE  04/12/2021        Assessment & Plan:   1. Essential hypertension Appears stable. Continue and monitor  2. Bilateral primary osteoarthritis of knee Utilize OTC tylenol/nsaids/topical prn. Patient to continue exercises to strengthen knees.   3. Dysthymic disorder monitor  4. Class 3 severe obesity due to excess calories with serious comorbidity and body mass index (BMI) of 50.0 to 59.9 in adult [facility]) Discussed dietary and activity options. Goal is 4-6lbs/mo weight loss.     No orders of the defined types were placed in this encounter.   Follow-up: Return in about 3 months (around 07/30/2021) for physical.    Becky Sax, MD

## 2021-05-13 ENCOUNTER — Encounter (HOSPITAL_COMMUNITY): Payer: Self-pay

## 2021-05-13 ENCOUNTER — Ambulatory Visit (HOSPITAL_COMMUNITY)
Admission: EM | Admit: 2021-05-13 | Discharge: 2021-05-13 | Disposition: A | Discharge status: Home / Self Care | Payer: Medicaid Other | Attending: Physician Assistant | Admitting: Physician Assistant

## 2021-05-13 ENCOUNTER — Other Ambulatory Visit: Payer: Self-pay

## 2021-05-13 DIAGNOSIS — H00012 Hordeolum externum right lower eyelid: Secondary | ICD-10-CM | POA: Diagnosis not present

## 2021-05-13 MED ORDER — ERYTHROMYCIN 5 MG/GM OP OINT: TOPICAL_OINTMENT | OPHTHALMIC | 0 refills | Status: DC

## 2021-05-13 NOTE — ED Provider Notes (Signed)
[place]    CSN: [id number] Arrival date & time: 05/13/21  1841      History   Chief Complaint Chief Complaint  Patient presents with   Eye Pain    HPI Carolyn Mcdonald is a 55 y.o. female.   Patient presents today with a 2-day history of increasing swelling and discomfort in her right eye.  She denies any history of any foreign particular matter prior to symptom onset.  She does not wear glasses or contacts.  She denies any recent illness or URI symptoms.  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  She has not tried any over-the-counter medications for symptom management.  She denies any foreign body sensation, vision changes, photophobia, headache, dizziness, nausea, vomiting.    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   Obstructive sleep apnea 12/23/2020   S/P abdominal hysterectomy 07/21/2020   Abnormal uterine bleeding (AUB)    Prediabetes 01/31/2020   Vitamin D deficiency 01/31/2020   Essential hypertension 01/30/2020   Fibroid 01/30/2020   Class 3 severe obesity due to excess calories with serious comorbidity and body mass index (BMI) of 50.0 to 59.9 in adult [facility]) 01/30/2020    Past Surgical History:  Procedure Laterality Date   ANKLE SURGERY     R ankle    CESAREAN SECTION     x3   HYSTERECTOMY ABDOMINAL WITH SALPINGECTOMY Bilateral 07/21/2020   Procedure: SUPRACERVICAL HYSTERECTOMY  WITH BILATERAL PARTIAL SALPINGECTOMY;  Surgeon: Mora Bellman, MD;  Location: [place];  Service: Gynecology;  Laterality: Bilateral;  tap block   SUPRACERVICAL ABDOMINAL HYSTERECTOMY  07/21/2020   TOE SURGERY     R Great toe    TUBAL LIGATION  1993    OB History     Gravida  3   Para      Term      Preterm      AB      Living         SAB      IAB       Ectopic      Multiple      Live Births  3            Home Medications    Prior to Admission medications   Medication Sig Start Date End Date Taking? Authorizing Provider  erythromycin ophthalmic ointment Place a 1/2 inch ribbon of ointment into the lower eyelid. 05/13/21  Yes Charissa Knowles, Derry Skill, PA-C  acetaminophen (TYLENOL) 650 MG CR tablet Take 650 mg by mouth every 8 (eight) hours as needed for pain.    [provider]  chlorhexidine (HIBICLENS) 4 % external liquid Apply topically daily as needed. 01/20/21   Volney American, PA-C  hydrochlorothiazide (HYDRODIURIL) 25 MG tablet Take 1 tablet (25 mg total) by mouth daily. 03/25/21   Camillia Herter, NP  losartan (COZAAR) 50 MG tablet TAKE 1 TABLET BY MOUTH DAILY. 02/12/21 02/12/22  Nicolette Bang, MD  Misc. Devices MISC 1 each by Does not apply route at bedtime. - Trial of CPAP therapy on 11 cm H2O or autopap 5-15. - Patient used a Medium Wide size Philips Respironics Full Face Mask Dreamwear mask and heated humidification. 12/23/20   Camillia Herter, NP  Multiple Vitamin (  MULTIVITAMIN) tablet Take 1 tablet by mouth daily.    [provider]  rivaroxaban (XARELTO) 20 MG TABS tablet TAKE 1 TABLET (20 MG TOTAL) BY MOUTH DAILY WITH SUPPER. 04/19/21 04/19/22  Mayers, Loraine Grip, PA-C    Family History History reviewed. No pertinent family history.  Social History Social History   Tobacco Use   Smoking status: Never   Smokeless tobacco: Never  Vaping Use   Vaping Use: Never used  Substance Use Topics   Alcohol use: Never   Drug use: Never     Allergies   Vicodin [hydrocodone-acetaminophen]   Review of Systems Review of Systems  Constitutional:  Negative for activity change, appetite change, fatigue and fever.  Eyes:  Positive for pain and discharge. Negative for photophobia, redness and visual disturbance.  Respiratory:  Negative for cough.   Cardiovascular:  Negative for chest pain.  Gastrointestinal:   Negative for abdominal pain, diarrhea, nausea and vomiting.  Neurological:  Negative for dizziness, light-headedness and headaches.    Physical Exam Triage Vital Signs ED Triage Vitals  Enc Vitals Group     BP 05/13/21 1903 (!) 141/88     Pulse Rate 05/13/21 1903 92     Resp 05/13/21 1903 19     Temp 05/13/21 1903 98.2 F (36.8 C)     Temp Source 05/13/21 1903 Oral     SpO2 05/13/21 1903 96 %     Weight --      Height --      Head Circumference --      Peak Flow --      Pain Score 05/13/21 1902 0     Pain Loc --      Pain Edu? --      Excl. in [place]? --    No data found.  Updated Vital Signs BP (!) 141/88 (BP Location: Right Arm)   Pulse 92   Temp 98.2 F (36.8 C) (Oral)   Resp 19   LMP 05/13/2020   SpO2 96%   Visual Acuity Right Eye Distance: 20/40 (Without correction ) Left Eye Distance: 20/40 (Without correction ) Bilateral Distance: 20/40 (Without correction )  Right Eye Near:   Left Eye Near:    Bilateral Near:     Physical Exam Vitals reviewed.  Constitutional:      General: She is awake. She is not in acute distress.    Appearance: Normal appearance. She is normal weight. She is not ill-appearing.     Comments: Very pleasant female appears stated age no acute distress sitting comfortably in exam room  HENT:     Head: Normocephalic and atraumatic.     Mouth/Throat:     Pharynx: Uvula midline. No oropharyngeal exudate or posterior oropharyngeal erythema.  Eyes:     General:        Right eye: Hordeolum present.        Left eye: No hordeolum.     Extraocular Movements: Extraocular movements intact.     Conjunctiva/sclera: Conjunctivae normal.     Pupils: Pupils are equal, round, and reactive to light.  Cardiovascular:     Rate and Rhythm: Normal rate and regular rhythm.     Heart sounds: Normal heart sounds, S1 normal and S2 normal. No murmur heard. Pulmonary:     Effort: Pulmonary effort is normal.     Breath sounds: Normal breath sounds. No  wheezing, rhonchi or rales.     Comments: Clear to auscultation bilaterally Abdominal:     General: Bowel  sounds are normal.     Palpations: Abdomen is soft.     Tenderness: There is no abdominal tenderness. There is no right CVA tenderness, left CVA tenderness, guarding or rebound.  Musculoskeletal:     Cervical back: Normal range of motion and neck supple.  Psychiatric:        Behavior: Behavior is cooperative.     UC Treatments / Results  Labs (all labs ordered are listed, but only abnormal results are displayed) Labs Reviewed - No data to display  EKG   Radiology No results found.  Procedures Procedures (including critical care time)  Medications Ordered in UC Medications - No data to display  Initial Impression / Assessment and Plan / UC Course  I have reviewed the triage vital signs and the nursing notes.  Pertinent labs & imaging results that were available during my care of the patient were reviewed by me and considered in my medical decision making (see chart for details).      Hordeolum noted on exam.  No foreign body sensation so fluorescein stain was not performed today.  Patient started on erythromycin ointment with instruction to avoid touching tip of bottle to her eye and make sure she washes her hands before applying medication to prevent contamination.  She can use warm compresses several times a day.  Encouraged her to use over-the-counter medications for additional symptom relief.  Discussed alarm symptoms that warrant emergent evaluation.  Strict return precautions given to which patient expressed understanding.  Final Clinical Impressions(s) / UC Diagnoses   Final diagnoses:  Hordeolum externum of right lower eyelid     Discharge Instructions      Use eye ointment at night.  Make sure to wash your hands prior to applying medication and avoid touching tip of bottle to your eye as it can contaminate the medication.  Use warm compresses several  times a day.  You can use over-the-counter medications for additional symptom relief.  If you have any worsening symptoms including vision changes, increased drainage from the eye, sensitivity to light, headache, dizziness, nausea, vomiting you need to be seen immediately.     ED Prescriptions     Medication Sig Dispense Auth. Provider   erythromycin ophthalmic ointment Place a 1/2 inch ribbon of ointment into the lower eyelid. 3.5 g Kaneisha Ellenberger K, PA-C      PDMP not reviewed this encounter.   Terrilee Croak, PA-C 05/13/21 1937

## 2021-05-13 NOTE — ED Triage Notes (Signed)
Pt presents with eye pain. States she has been itching and expresses concerns for a stye or pink eye.

## 2021-05-13 NOTE — Discharge Instructions (Addendum)
Use eye ointment at night.  Make sure to wash your hands prior to applying medication and avoid touching tip of bottle to your eye as it can contaminate the medication.  Use warm compresses several times a day.  You can use over-the-counter medications for additional symptom relief.  If you have any worsening symptoms including vision changes, increased drainage from the eye, sensitivity to light, headache, dizziness, nausea, vomiting you need to be seen immediately.

## 2021-05-18 ENCOUNTER — Other Ambulatory Visit: Payer: Self-pay

## 2021-05-25 ENCOUNTER — Other Ambulatory Visit: Payer: Self-pay

## 2021-06-24 ENCOUNTER — Other Ambulatory Visit: Payer: Self-pay

## 2021-06-24 ENCOUNTER — Telehealth (INDEPENDENT_AMBULATORY_CARE_PROVIDER_SITE_OTHER): Payer: Medicaid Other | Admitting: Nurse Practitioner

## 2021-06-24 ENCOUNTER — Encounter: Payer: Self-pay | Admitting: Nurse Practitioner

## 2021-06-24 DIAGNOSIS — I10 Essential (primary) hypertension: Secondary | ICD-10-CM

## 2021-06-24 DIAGNOSIS — U071 COVID-19: Secondary | ICD-10-CM | POA: Diagnosis not present

## 2021-06-24 MED ORDER — MOLNUPIRAVIR 200 MG PO CAPS
4.0000 | ORAL_CAPSULE | Freq: Two times a day (BID) | ORAL | 0 refills | Status: AC
Start: 2021-06-24 — End: 2021-06-29
  Filled 2021-06-24: qty 40, 5d supply, fill #0

## 2021-06-24 NOTE — Progress Notes (Signed)
Virtual Visit via Telephone Note  I connected with Carolyn Mcdonald on 06/24/21 at  9:20 AM EDT by telephone and verified that I am speaking with the correct person using two identifiers.  Location: Patient: home Provider: office   I discussed the limitations, risks, security and privacy concerns of performing an evaluation and management service by telephone and the availability of in person appointments. I also discussed with the patient that there may be a patient responsible charge related to this service. The patient expressed understanding and agreed to proceed.   History of Present Illness:  Patient presents today for a televisit for COVID.  Patient states that her symptoms started this past Monday.  Patient tested positive for COVID on Tuesday.  She states that she has been having sinus congestion, sinus pressure and pain with headache, postnasal drip with thick mucus.  Patient has been taking over-the-counter Mucinex with minimal relief noted.  Patient is over the age of 22 and does have hypertension.  This will qualify her for oral antiviral treatment.  We discussed that we will call this into the pharmacy for her today. Denies f/c/s, n/v/d, hemoptysis, PND, chest pain or edema   Observations/Objective:  Vitals with BMI 05/13/2021 04/29/2021 04/19/2021  Height - 5' 2.992" 5\' 3"   Weight - 314 lbs 313 lbs  BMI - 84.16 60.63  Systolic 016 010 932  Diastolic 88 85 97  Pulse 92 96 96      Assessment and Plan:  Covid 19:   Stay well hydrated  Stay active  Deep breathing exercises  May take tylenol for fever or pain  May take mucinex DM twice daily  You are being prescribed MOLNUPIRAVIR for COVID-19 infection.   Please pick up your prescription at:  [facility] ([address], [place], [place] [address][phone])  Monday through Friday 8a-5:30p   Please call the pharmacy or go through the drive through vs going inside if you are  picking up the mediation yourself to prevent further spread. If prescribed to a [facility] affiliated pharmacy, a pharmacist will bring the medication out to your car.   ADMINISTRATION INSTRUCTIONS: Take with or without food. Swallow the tablets whole. Don't chew, crush, or break the medications because it might not work as well  For each dose of the medication, you should be taking FOUR tablets at one time, TWICE a day   Finish your full five-day course of Molnupiravir even if you feel better before you're done. Stopping this medication too early can make it less effective to prevent severe illness related to [place].    Molnupiravir is prescribed for YOU ONLY. Don't share it with others, even if they have similar symptoms as you. This medication might not be right for everyone.   Make sure to take steps to protect yourself and others while you're taking this medication in order to get well soon and to prevent others from getting sick with COVID-19.   **If you are of childbearing potential (any gender) - it is advised to not get pregnant while taking this medication and recommended that condoms are used for female partners the next 3 months after taking the medication out of extreme caution    COMMON SIDE EFFECTS: Diarrhea Nausea  Dizziness    If your COVID-19 symptoms get worse, get medical help right away. Call 911 if you experience symptoms such as worsening cough, trouble breathing, chest pain that doesn't go away, confusion, a hard time staying awake, and pale  or blue-colored skin. This medication won't prevent all COVID-19 cases from getting worse.      Follow up:  Follow up  if needed    I discussed the assessment and treatment plan with the patient. The patient was provided an opportunity to ask questions and all were answered. The patient agreed with the plan and demonstrated an understanding of the instructions.   The patient was advised to call back or seek an  in-person evaluation if the symptoms worsen or if the condition fails to improve as anticipated.  I provided 23 minutes of non-face-to-face time during this encounter.   Fenton Foy, NP

## 2021-06-24 NOTE — Patient Instructions (Signed)
Covid 19:   Stay well hydrated  Stay active  Deep breathing exercises  May take tylenol for fever or pain  May take mucinex DM twice daily  You are being prescribed MOLNUPIRAVIR for COVID-19 infection.   Please pick up your prescription at:  [facility] ([address], [place], [place] [phone])  Monday through Friday 8a-5:30p   Please call the pharmacy or go through the drive through vs going inside if you are picking up the mediation yourself to prevent further spread. If prescribed to a [facility] affiliated pharmacy, a pharmacist will bring the medication out to your car.   ADMINISTRATION INSTRUCTIONS: Take with or without food. Swallow the tablets whole. Don't chew, crush, or break the medications because it might not work as well  For each dose of the medication, you should be taking FOUR tablets at one time, TWICE a day   Finish your full five-day course of Molnupiravir even if you feel better before you're done. Stopping this medication too early can make it less effective to prevent severe illness related to [place].    Molnupiravir is prescribed for YOU ONLY. Don't share it with others, even if they have similar symptoms as you. This medication might not be right for everyone.   Make sure to take steps to protect yourself and others while you're taking this medication in order to get well soon and to prevent others from getting sick with COVID-19.   **If you are of childbearing potential (any gender) - it is advised to not get pregnant while taking this medication and recommended that condoms are used for female partners the next 3 months after taking the medication out of extreme caution    COMMON SIDE EFFECTS: Diarrhea Nausea  Dizziness    If your COVID-19 symptoms get worse, get medical help right away. Call 911 if you experience symptoms such as worsening cough, trouble breathing, chest pain that doesn't go away,  confusion, a hard time staying awake, and pale or blue-colored skin. This medication won't prevent all COVID-19 cases from getting worse.      Follow up:  Follow up  if needed COVID-19: What to Do if You Are Sick If you test positive and are an older adult or someone who is at high risk of getting very sick from COVID-19, treatment may be available. Contact a healthcare provider right away after a positive test to determine if you are eligible, even if your symptoms are mild right now. You can also visit a Test to Treat location and, if eligible, receive a prescription from a provider. Don't delay: Treatment must be started within the first few days to be effective. If you have a fever, cough, or other symptoms, you might have COVID-19. Most people have mild illness and are able to recover at home. If you are sick: Keep track of your symptoms. If you have an emergency warning sign (including trouble breathing), call 911. Steps to help prevent the spread of COVID-19 if you are sick If you are sick with COVID-19 or think you might have COVID-19, follow the steps below to care for yourself and to help protect other people in your home and community. Stay home except to get medical care Stay home. Most people with COVID-19 have mild illness and can recover at home without medical care. Do not leave your home, except to get medical care. Do not visit public areas and do not go to places where you are unable  to wear a mask. Take care of yourself. Get rest and stay hydrated. Take over-the-counter medicines, such as acetaminophen, to help you feel better. Stay in touch with your doctor. Call before you get medical care. Be sure to get care if you have trouble breathing, or have any other emergency warning signs, or if you think it is an emergency. Avoid public transportation, ride-sharing, or taxis if possible. Get tested If you have symptoms of COVID-19, get tested. While waiting for test results,  stay away from others, including staying apart from those living in your household. Get tested as soon as possible after your symptoms start. Treatments may be available for people with COVID-19 who are at risk for becoming very sick. Don't delay: Treatment must be started early to be effective--some treatments must begin within 5 days of your first symptoms. Contact your healthcare provider right away if your test result is positive to determine if you are eligible. Self-tests are one of several options for testing for the virus that causes COVID-19 and may be more convenient than laboratory-based tests and point-of-care tests. Ask your healthcare provider or your local health department if you need help interpreting your test results. You can visit your state, tribal, local, and territorial health department's website to look for the latest local information on testing sites. Separate yourself from other people As much as possible, stay in a specific room and away from other people and pets in your home. If possible, you should use a separate bathroom. If you need to be around other people or animals in or outside of the home, wear a well-fitting mask. Tell your close contacts that they may have been exposed to COVID-19. An infected person can spread COVID-19 starting 48 hours (or 2 days) before the person has any symptoms or tests positive. By letting your close contacts know they may have been exposed to COVID-19, you are helping to protect everyone. See COVID-19 and Animals if you have questions about pets. If you are diagnosed with COVID-19, someone from the health department may call you. Answer the call to slow the spread. Monitor your symptoms Symptoms of COVID-19 include fever, cough, or other symptoms. Follow care instructions from your healthcare provider and local health department. Your local health authorities may give instructions on checking your symptoms and reporting information. When  to seek emergency medical attention Look for emergency warning signs* for COVID-19. If someone is showing any of these signs, seek emergency medical care immediately: Trouble breathing Persistent pain or pressure in the chest New confusion Inability to wake or stay awake Pale, gray, or blue-colored skin, lips, or nail beds, depending on skin tone *This list is not all possible symptoms. Please call your medical provider for any other symptoms that are severe or concerning to you. Call 911 or call ahead to your local emergency facility: Notify the operator that you are seeking care for someone who has or may have COVID-19. Call ahead before visiting your doctor Call ahead. Many medical visits for routine care are being postponed or done by phone or telemedicine. If you have a medical appointment that cannot be postponed, call your doctor's office, and tell them you have or may have COVID-19. This will help the office protect themselves and other patients. If you are sick, wear a well-fitting mask You should wear a mask if you must be around other people or animals, including pets (even at home). Wear a mask with the best fit, protection, and comfort for you. You  don't need to wear the mask if you are alone. If you can't put on a mask (because of trouble breathing, for example), cover your coughs and sneezes in some other way. Try to stay at least 6 feet away from other people. This will help protect the people around you. Masks should not be placed on young children under age 107 years, anyone who has trouble breathing, or anyone who is not able to remove the mask without help. Cover your coughs and sneezes Cover your mouth and nose with a tissue when you cough or sneeze. Throw away used tissues in a lined trash can. Immediately wash your hands with soap and water for at least 20 seconds. If soap and water are not available, clean your hands with an alcohol-based hand sanitizer that contains at  least 60% alcohol. Clean your hands often Wash your hands often with soap and water for at least 20 seconds. This is especially important after blowing your nose, coughing, or sneezing; going to the bathroom; and before eating or preparing food. Use hand sanitizer if soap and water are not available. Use an alcohol-based hand sanitizer with at least 60% alcohol, covering all surfaces of your hands and rubbing them together until they feel dry. Soap and water are the best option, especially if hands are visibly dirty. Avoid touching your eyes, nose, and mouth with unwashed hands. Handwashing Tips Avoid sharing personal household items Do not share dishes, drinking glasses, cups, eating utensils, towels, or bedding with other people in your home. Wash these items thoroughly after using them with soap and water or put in the dishwasher. Clean surfaces in your home regularly Clean and disinfect high-touch surfaces (for example, doorknobs, tables, handles, light switches, and countertops) in your "sick room" and bathroom. In shared spaces, you should clean and disinfect surfaces and items after each use by the person who is ill. If you are sick and cannot clean, a caregiver or other person should only clean and disinfect the area around you (such as your bedroom and bathroom) on an as needed basis. Your caregiver/other person should wait as long as possible (at least several hours) and wear a mask before entering, cleaning, and disinfecting shared spaces that you use. Clean and disinfect areas that may have blood, stool, or body fluids on them. Use household cleaners and disinfectants. Clean visible dirty surfaces with household cleaners containing soap or detergent. Then, use a household disinfectant. Use a product from [employer] List N: Disinfectants for Coronavirus (ZGYFV-49). Be sure to follow the instructions on the label to ensure safe and effective use of the product. Many products recommend keeping  the surface wet with a disinfectant for a certain period of time (look at "contact time" on the product label). You may also need to wear personal protective equipment, such as gloves, depending on the directions on the product label. Immediately after disinfecting, wash your hands with soap and water for 20 seconds. For completed guidance on cleaning and disinfecting your home, visit Complete Disinfection Guidance. Take steps to improve ventilation at home Improve ventilation (air flow) at home to help prevent from spreading COVID-19 to other people in your household. Clear out COVID-19 virus particles in the air by opening windows, using air filters, and turning on fans in your home. Use this interactive tool to learn how to improve air flow in your home. When you can be around others after being sick with COVID-19 Deciding when you can be around others is different for different  situations. Find out when you can safely end home isolation. For any additional questions about your care, contact your healthcare provider or state or local health department. 12/01/2020 Content source: [facility] Res[facility] ([facility]), Division of Viral Diseases This information is not intended to replace advice given to you by your health care provider. Make sure you discuss any questions you have with your health care provider. Document Revised: 01/14/2021 Document Reviewed: 01/14/2021 Elsevier Patient Education  [place]

## 2021-06-25 ENCOUNTER — Other Ambulatory Visit: Payer: Self-pay

## 2021-07-05 ENCOUNTER — Other Ambulatory Visit: Payer: Self-pay

## 2021-07-05 ENCOUNTER — Other Ambulatory Visit: Payer: Self-pay | Admitting: Family

## 2021-07-05 DIAGNOSIS — I10 Essential (primary) hypertension: Secondary | ICD-10-CM

## 2021-07-06 ENCOUNTER — Telehealth: Payer: Self-pay | Admitting: Family Medicine

## 2021-07-06 ENCOUNTER — Encounter (HOSPITAL_COMMUNITY): Payer: Self-pay | Admitting: Emergency Medicine

## 2021-07-06 ENCOUNTER — Other Ambulatory Visit: Payer: Self-pay

## 2021-07-06 ENCOUNTER — Ambulatory Visit (HOSPITAL_COMMUNITY)
Admission: EM | Admit: 2021-07-06 | Discharge: 2021-07-06 | Disposition: A | Discharge status: Home / Self Care | Payer: Medicaid Other | Attending: Emergency Medicine | Admitting: Emergency Medicine

## 2021-07-06 DIAGNOSIS — I10 Essential (primary) hypertension: Secondary | ICD-10-CM | POA: Diagnosis not present

## 2021-07-06 MED ORDER — HYDROCHLOROTHIAZIDE 25 MG PO TABS: 25.0000 mg | ORAL_TABLET | Freq: Every day | ORAL | Status: DC

## 2021-07-06 MED ORDER — CLONIDINE HCL 0.1 MG PO TABS
0.1000 mg | ORAL_TABLET | Freq: Once | ORAL | Status: AC
  Administered 2021-07-06: 0.1 mg via ORAL

## 2021-07-06 MED ORDER — CLONIDINE HCL 0.1 MG PO TABS
ORAL_TABLET | ORAL | Status: AC
  Filled 2021-07-06: qty 1

## 2021-07-06 NOTE — Discharge Instructions (Signed)
Keep appointment with your PCP. Tonight take both your hydrochlorothiazide and losartan before bed. Tomorrow take 50 mg of your hydrochlorothiazide which is an increase from 25. Keep dosing of losartan the same. If you experience worsening headache or dizziness, please go to the emergency department.

## 2021-07-06 NOTE — ED Notes (Signed)
Called pt to advise room was ready, called from lobby, no answer.

## 2021-07-06 NOTE — ED Triage Notes (Signed)
Pt presents with "whoozy feeling" , congestion in throat, and headache that started while at work today. States did not take BP medication today. States had COVID approx 2 weeks ago.

## 2021-07-06 NOTE — Telephone Encounter (Signed)
1) Medication(s) Requested (by name):  hydrochlorothiazide (HYDRODIURIL) 25 MG tablet    2) Pharmacy of Choice:  [facility]   3) Special Requests:   Approved medications will be sent to the pharmacy, we will reach out if there is an issue.  Requests made after 3pm may not be addressed until the following business day!  If a patient is unsure of the name of the medication(s) please note and ask patient to call back when they are able to provide all info, do not send to responsible party until all information is available!

## 2021-07-06 NOTE — ED Provider Notes (Signed)
[facility]  ____________________________________________  Time seen: Approximately 5:14 PM  I have reviewed the triage vital signs and the nursing notes.   HISTORY  Chief Complaint Headache, Dizziness, Nasal Congestion, and Sore Throat   Historian Patient     HPI Defne Baksh is a 55 y.o. female with a history of essential hypertension, presents to the urgent care with concern for elevated blood pressure today.  Patient did not take any of her blood pressure medications today and she normally takes 25 mg of hydrochlorothiazide.  She states that she has had some headache and dizziness today.  She denies chest pain, chest tightness or shortness of breath.   Past Medical History:  Diagnosis Date   Fibroid    Hypertension      Immunizations up to date:  Yes.     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   Obstructive sleep apnea 12/23/2020   S/P abdominal hysterectomy 07/21/2020   Abnormal uterine bleeding (AUB)    Prediabetes 01/31/2020   Vitamin D deficiency 01/31/2020   Essential hypertension 01/30/2020   Fibroid 01/30/2020   Class 3 severe obesity due to excess calories with serious comorbidity and body mass index (BMI) of 50.0 to 59.9 in adult [facility]) 01/30/2020    Past Surgical History:  Procedure Laterality Date   ANKLE SURGERY     R ankle    CESAREAN SECTION     x3   HYSTERECTOMY ABDOMINAL WITH SALPINGECTOMY Bilateral 07/21/2020   Procedure: SUPRACERVICAL HYSTERECTOMY  WITH BILATERAL PARTIAL SALPINGECTOMY;  Surgeon: Mora Bellman, MD;  Location: [facility];  Service: Gynecology;  Laterality: Bilateral;  tap block   SUPRACERVICAL ABDOMINAL HYSTERECTOMY  07/21/2020   TOE SURGERY     R Great toe    TUBAL LIGATION  1993    Prior to Admission medications   Medication Sig Start Date End Date Taking? Authorizing Provider   acetaminophen (TYLENOL) 650 MG CR tablet Take 650 mg by mouth every 8 (eight) hours as needed for pain.    [provider]  chlorhexidine (HIBICLENS) 4 % external liquid Apply topically daily as needed. 01/20/21   Volney American, PA-C  erythromycin ophthalmic ointment Place a 1/2 inch ribbon of ointment into the lower eyelid. 05/13/21   Raspet, Derry Skill, PA-C  hydrochlorothiazide (HYDRODIURIL) 25 MG tablet Take 1 tablet (25 mg total) by mouth daily. 03/25/21   Camillia Herter, NP  losartan (COZAAR) 50 MG tablet TAKE 1 TABLET BY MOUTH DAILY. 02/12/21 02/12/22  Nicolette Bang, MD  Misc. Devices MISC 1 each by Does not apply route at bedtime. - Trial of CPAP therapy on 11 cm H2O or autopap 5-15. - Patient used a Medium Wide size [employer] Respironics Full Face Mask Dreamwear mask and heated humidification. 12/23/20   Camillia Herter, NP  Multiple Vitamin (MULTIVITAMIN) tablet Take 1 tablet by mouth daily.    [provider]  rivaroxaban (XARELTO) 20 MG TABS tablet TAKE 1 TABLET (20 MG TOTAL) BY MOUTH DAILY WITH SUPPER. 04/19/21 04/19/22  Mayers, Cari S, PA-C    Allergies Vicodin [hydrocodone-acetaminophen]  History reviewed. No pertinent family history.  Social History Social History   Tobacco Use   Smoking status: Never   Smokeless tobacco: Never  Vaping Use   Vaping Use: Never used  Substance Use Topics   Alcohol use:  Never   Drug use: Never     Review of Systems  Constitutional: No fever/chills Eyes:  No discharge ENT: No upper respiratory complaints. Respiratory: no cough. No SOB/ use of accessory muscles to breath Gastrointestinal:   No nausea, no vomiting.  No diarrhea.  No constipation. Musculoskeletal: Negative for musculoskeletal pain. Skin: Negative for rash, abrasions, lacerations, ecchymosis.    ____________________________________________   PHYSICAL EXAM:  VITAL SIGNS: ED Triage Vitals  Enc Vitals Group     BP 07/06/21 1709 (!) 177/120      Pulse Rate 07/06/21 1709 93     Resp 07/06/21 1709 16     Temp 07/06/21 1709 98.2 F (36.8 C)     Temp Source 07/06/21 1709 Oral     SpO2 07/06/21 1709 97 %     Weight --      Height --      Head Circumference --      Peak Flow --      Pain Score 07/06/21 1705 0     Pain Loc --      Pain Edu? --      Excl. in [place]? --      Constitutional: Alert and oriented. Well appearing and in no acute distress. Eyes: Conjunctivae are normal. PERRL. EOMI. Head: Atraumatic. ENT:      Nose: No congestion/rhinnorhea.      Mouth/Throat: Mucous membranes are moist.  Neck: No stridor.  No cervical spine tenderness to palpation. Hematological/Lymphatic/Immunilogical: No cervical lymphadenopathy. Cardiovascular: Normal rate, regular rhythm. Normal S1 and S2.  Good peripheral circulation. Respiratory: Normal respiratory effort without tachypnea or retractions. Lungs CTAB. Good air entry to the bases with no decreased or absent breath sounds Gastrointestinal: Bowel sounds x 4 quadrants. Soft and nontender to palpation. No guarding or rigidity. No distention. Musculoskeletal: Full range of motion to all extremities. No obvious deformities noted Neurologic:  Normal for age. No gross focal neurologic deficits are appreciated.  Skin:  Skin is warm, dry and intact. No rash noted. Psychiatric: Mood and affect are normal for age. Speech and behavior are normal.   ____________________________________________   LABS (all labs ordered are listed, but only abnormal results are displayed)  Labs Reviewed - No data to display ____________________________________________  EKG   ____________________________________________  RADIOLOGY   No results found.  ____________________________________________    PROCEDURES  Procedure(s) performed:     Procedures     Medications  cloNIDine (CATAPRES) tablet 0.1 mg (0.1 mg Oral Given 07/06/21 1728)      ____________________________________________   INITIAL IMPRESSION / ASSESSMENT AND PLAN / ED COURSE  Pertinent labs & imaging results that were available during my care of the patient were reviewed by me and considered in my medical decision making (see chart for details).      Assessment and Plan: Hypertension 55 year old female presents to the urgent care with elevated blood pressure today after not taking any of her blood pressure medications.  She was hypertensive at triage but vital signs otherwise reassuring.  She had no neurodeficits on exam and was given 0.1 mg of Catapres and her blood pressure trended down.  Patient reports that she felt improved after her blood pressure started trending down.  I advised patient to take her blood pressure medications when she returns home and tomorrow to increase her hydrochlorothiazide to 50 mg and to keep her normal dose of losartan at night until she can follow-up with her primary care.  She was cautioned to seek care at local  emergency department if she should experience worsening headache, dizziness or other new or worsening symptoms.     ____________________________________________  FINAL CLINICAL IMPRESSION(S) / ED DIAGNOSES  Final diagnoses:  Essential hypertension      NEW MEDICATIONS STARTED DURING THIS VISIT:  ED Discharge Orders     None           This chart was dictated using voice recognition software/Dragon. Despite best efforts to proofread, errors can occur which can change the meaning. Any change was purely unintentional.     Lannie Fields, PA-C 07/06/21 2102

## 2021-07-07 ENCOUNTER — Other Ambulatory Visit: Payer: Self-pay | Admitting: *Deleted

## 2021-07-07 ENCOUNTER — Other Ambulatory Visit: Payer: Self-pay

## 2021-07-07 ENCOUNTER — Telehealth: Payer: Self-pay | Admitting: Family Medicine

## 2021-07-07 DIAGNOSIS — I10 Essential (primary) hypertension: Secondary | ICD-10-CM

## 2021-07-07 MED ORDER — HYDROCHLOROTHIAZIDE 25 MG PO TABS
25.0000 mg | ORAL_TABLET | Freq: Every day | ORAL | 0 refills | Status: DC
  Filled 2021-07-07: qty 30, 30d supply, fill #0

## 2021-07-07 NOTE — Telephone Encounter (Signed)
Pt checking in for med refill Rx #: [id number]  hydrochlorothiazide (HYDRODIURIL) 25 MG tablet [[id number]]   To   [facility] states yesterday it was 150/120 and she left work since she was feeling whoozy and went to Urgent Care. Seeking refill asap. Please advise and thank you.

## 2021-07-07 NOTE — Telephone Encounter (Signed)
Patient called and 30 days supply sent in until her appt

## 2021-07-08 ENCOUNTER — Other Ambulatory Visit: Payer: Self-pay

## 2021-07-21 ENCOUNTER — Other Ambulatory Visit: Payer: Self-pay

## 2021-07-22 ENCOUNTER — Other Ambulatory Visit: Payer: Self-pay | Admitting: Family Medicine

## 2021-07-22 DIAGNOSIS — I10 Essential (primary) hypertension: Secondary | ICD-10-CM

## 2021-07-23 ENCOUNTER — Other Ambulatory Visit: Payer: Self-pay

## 2021-07-23 MED ORDER — HYDROCHLOROTHIAZIDE 25 MG PO TABS
25.0000 mg | ORAL_TABLET | Freq: Every day | ORAL | 0 refills | Status: DC
  Filled 2021-07-23 (×2): qty 90, 90d supply, fill #0

## 2021-07-29 ENCOUNTER — Other Ambulatory Visit: Payer: Self-pay | Admitting: Family Medicine

## 2021-07-29 DIAGNOSIS — Z1231 Encounter for screening mammogram for malignant neoplasm of breast: Secondary | ICD-10-CM

## 2021-07-30 ENCOUNTER — Other Ambulatory Visit: Payer: Self-pay

## 2021-07-30 ENCOUNTER — Encounter: Payer: Medicaid Other | Admitting: Family Medicine

## 2021-07-30 ENCOUNTER — Ambulatory Visit (INDEPENDENT_AMBULATORY_CARE_PROVIDER_SITE_OTHER): Payer: Medicaid Other | Admitting: Family Medicine

## 2021-07-30 VITALS — BP 138/86 | HR 78 | Temp 97.6°F | Resp 16 | Wt 306.0 lb

## 2021-07-30 DIAGNOSIS — Z0000 Encounter for general adult medical examination without abnormal findings: Secondary | ICD-10-CM | POA: Diagnosis not present

## 2021-07-30 DIAGNOSIS — Z6841 Body Mass Index (BMI) 40.0 and over, adult: Secondary | ICD-10-CM

## 2021-07-30 NOTE — Progress Notes (Signed)
Established  Patient Office Visit  Subjective:  Patient ID: Carolyn Mcdonald, female    DOB: 29-Aug-1966  Age: 55 y.o. MRN: [id number]  CC:  Chief Complaint  Patient presents with   Annual Exam    HPI Carolyn Mcdonald presents for routine annual exam. Patient denies acute complaints or concerns.   Past Medical History:  Diagnosis Date   Fibroid    Hypertension     Past Surgical History:  Procedure Laterality Date   ANKLE SURGERY     R ankle    CESAREAN SECTION     x3   HYSTERECTOMY ABDOMINAL WITH SALPINGECTOMY Bilateral 07/21/2020   Procedure: SUPRACERVICAL HYSTERECTOMY  WITH BILATERAL PARTIAL SALPINGECTOMY;  Surgeon: Mora Bellman, MD;  Location: [facility];  Service: Gynecology;  Laterality: Bilateral;  tap block   SUPRACERVICAL ABDOMINAL HYSTERECTOMY  07/21/2020   TOE SURGERY     R Great toe    TUBAL LIGATION  1993    No family history on file.  Social History   Socioeconomic History   Marital status: Married    Spouse name: Not on file   Number of children: Not on file   Years of education: Not on file   Highest education level: Not on file  Occupational History   Not on file  Tobacco Use   Smoking status: Never   Smokeless tobacco: Never  Vaping Use   Vaping Use: Never used  Substance and Sexual Activity   Alcohol use: Never   Drug use: Never   Sexual activity: Not Currently    Birth control/protection: Surgical  Other Topics Concern   Not on file  Social History Narrative   Not on file   Social Determinants of Health   Financial Resource Strain: Not on file  Food Insecurity: Not on file  Transportation Needs: Not on file  Physical Activity: Not on file  Stress: Not on file  Social Connections: Not on file  Intimate Partner Violence: Not on file    ROS Review of Systems  All other systems reviewed and are negative.  Objective:   Today's Vitals: BP 138/86   Pulse 78   Temp 97.6 F (36.4 C) (Oral)   Resp 16   Wt (!) 306 lb (138.8 kg)    LMP 05/13/2020   SpO2 97%   BMI 54.22 kg/m   Physical Exam Vitals and nursing note reviewed.  Constitutional:      General: She is not in acute distress.    Appearance: She is obese.  HENT:     Head: Normocephalic and atraumatic.     Right Ear: Tympanic membrane, ear canal and external ear normal.     Left Ear: Tympanic membrane, ear canal and external ear normal.     Nose: Nose normal.     Mouth/Throat:     Mouth: Mucous membranes are moist.     Pharynx: Oropharynx is clear.  Eyes:     Conjunctiva/sclera: Conjunctivae normal.     Pupils: Pupils are equal, round, and reactive to light.  Neck:     Thyroid: No thyromegaly.  Cardiovascular:     Rate and Rhythm: Normal rate and regular rhythm.     Heart sounds: Normal heart sounds. No murmur heard. Pulmonary:     Effort: Pulmonary effort is normal. No respiratory distress.     Breath sounds: Normal breath sounds.  Abdominal:     General: There is no distension.     Palpations: Abdomen is soft. There is no mass.  Tenderness: There is no abdominal tenderness.  Musculoskeletal:        General: Normal range of motion.     Cervical back: Normal range of motion and neck supple.  Skin:    General: Skin is warm and dry.  Neurological:     General: No focal deficit present.     Mental Status: She is alert and oriented to person, place, and time.  Psychiatric:        Mood and Affect: Mood normal.        Behavior: Behavior normal.    Assessment & Plan:   1. Well woman exam (no gynecological exam) Unremarkable exam. Routine labs ordered - CMP14+EGFR - CBC with Differential - Lipid Panel - Hepatitis C Antibody  2. Class 3 severe obesity due to excess calories with serious comorbidity and body mass index (BMI) of 50.0 to 59.9 in adult (HCC) Wt loss of about 7 lbs. Goal continues 3-5lbs wt loss/mo.     Outpatient Encounter Medications as of 07/30/2021  Medication Sig   acetaminophen (TYLENOL) 650 MG CR tablet Take 650  mg by mouth every 8 (eight) hours as needed for pain.   chlorhexidine (HIBICLENS) 4 % external liquid Apply topically daily as needed.   erythromycin ophthalmic ointment Place a 1/2 inch ribbon of ointment into the lower eyelid.   hydrochlorothiazide (HYDRODIURIL) 25 MG tablet Take 1 tablet (25 mg total) by mouth daily.   losartan (COZAAR) 50 MG tablet TAKE 1 TABLET BY MOUTH DAILY.   Misc. Devices MISC 1 each by Does not apply route at bedtime. - Trial of CPAP therapy on 11 cm H2O or autopap 5-15. - Patient used a Medium Wide size Philips Respironics Full Face Mask Dreamwear mask and heated humidification.   Multiple Vitamin (MULTIVITAMIN) tablet Take 1 tablet by mouth daily.   rivaroxaban (XARELTO) 20 MG TABS tablet TAKE 1 TABLET (20 MG TOTAL) BY MOUTH DAILY WITH SUPPER.   No facility-administered encounter medications on file as of 07/30/2021.    Follow-up: No follow-ups on file.   Becky Sax, MD

## 2021-07-30 NOTE — Progress Notes (Deleted)
Established Patient Office Visit  Subjective:  Patient ID: Carolyn Mcdonald, female    DOB: 1966-05-28  Age: 55 y.o. MRN: [id number]  CC:  Chief Complaint  Patient presents with   Annual Exam    HPI Carolyn Mcdonald presents for ***  Past Medical History:  Diagnosis Date   Fibroid    Hypertension     Past Surgical History:  Procedure Laterality Date   ANKLE SURGERY     R ankle    CESAREAN SECTION     x3   HYSTERECTOMY ABDOMINAL WITH SALPINGECTOMY Bilateral 07/21/2020   Procedure: SUPRACERVICAL HYSTERECTOMY  WITH BILATERAL PARTIAL SALPINGECTOMY;  Surgeon: Mora Bellman, MD;  Location: [facility];  Service: Gynecology;  Laterality: Bilateral;  tap block   SUPRACERVICAL ABDOMINAL HYSTERECTOMY  07/21/2020   TOE SURGERY     R Great toe    TUBAL LIGATION  1993    No family history on file.  Social History   Socioeconomic History   Marital status: Married    Spouse name: Not on file   Number of children: Not on file   Years of education: Not on file   Highest education level: Not on file  Occupational History   Not on file  Tobacco Use   Smoking status: Never   Smokeless tobacco: Never  Vaping Use   Vaping Use: Never used  Substance and Sexual Activity   Alcohol use: Never   Drug use: Never   Sexual activity: Not Currently    Birth control/protection: Surgical  Other Topics Concern   Not on file  Social History Narrative   Not on file   Social Determinants of Health   Financial Resource Strain: Not on file  Food Insecurity: Not on file  Transportation Needs: Not on file  Physical Activity: Not on file  Stress: Not on file  Social Connections: Not on file  Intimate Partner Violence: Not on file    ROS Review of Systems  All other systems reviewed and are negative.  Objective:   Today's Vitals: BP 138/86   Pulse 78   Temp 97.6 F (36.4 C) (Oral)   Resp 16   Wt (!) 306 lb (138.8 kg)   LMP 05/13/2020   SpO2 97%   BMI 54.22 kg/m   Physical  Exam Vitals and nursing note reviewed.  Constitutional:      General: She is not in acute distress.    Appearance: She is obese.  HENT:     Head: Normocephalic and atraumatic.     Right Ear: Tympanic membrane, ear canal and external ear normal.     Left Ear: Tympanic membrane, ear canal and external ear normal.     Nose: Nose normal.     Mouth/Throat:     Mouth: Mucous membranes are moist.     Pharynx: Oropharynx is clear.  Eyes:     Conjunctiva/sclera: Conjunctivae normal.     Pupils: Pupils are equal, round, and reactive to light.  Neck:     Thyroid: No thyromegaly.  Cardiovascular:     Rate and Rhythm: Normal rate and regular rhythm.     Heart sounds: Normal heart sounds. No murmur heard. Pulmonary:     Effort: Pulmonary effort is normal. No respiratory distress.     Breath sounds: Normal breath sounds.  Abdominal:     General: There is no distension.     Palpations: Abdomen is soft. There is no mass.     Tenderness: There is no abdominal tenderness.  Musculoskeletal:        General: Normal range of motion.     Cervical back: Normal range of motion and neck supple.  Skin:    General: Skin is warm and dry.  Neurological:     General: No focal deficit present.     Mental Status: She is alert and oriented to person, place, and time.  Psychiatric:        Mood and Affect: Mood normal.        Behavior: Behavior normal.    Assessment & Plan:   Problem List Items Addressed This Visit   None   Outpatient Encounter Medications as of 07/30/2021  Medication Sig   acetaminophen (TYLENOL) 650 MG CR tablet Take 650 mg by mouth every 8 (eight) hours as needed for pain.   chlorhexidine (HIBICLENS) 4 % external liquid Apply topically daily as needed.   erythromycin ophthalmic ointment Place a 1/2 inch ribbon of ointment into the lower eyelid.   hydrochlorothiazide (HYDRODIURIL) 25 MG tablet Take 1 tablet (25 mg total) by mouth daily.   losartan (COZAAR) 50 MG tablet TAKE 1  TABLET BY MOUTH DAILY.   Misc. Devices MISC 1 each by Does not apply route at bedtime. - Trial of CPAP therapy on 11 cm H2O or autopap 5-15. - Patient used a Medium Wide size Philips Respironics Full Face Mask Dreamwear mask and heated humidification.   Multiple Vitamin (MULTIVITAMIN) tablet Take 1 tablet by mouth daily.   rivaroxaban (XARELTO) 20 MG TABS tablet TAKE 1 TABLET (20 MG TOTAL) BY MOUTH DAILY WITH SUPPER.   No facility-administered encounter medications on file as of 07/30/2021.    Follow-up: No follow-ups on file.   Becky Sax, MD

## 2021-07-30 NOTE — Progress Notes (Signed)
Patient has question regarding her blood pressure possible to low

## 2021-07-31 ENCOUNTER — Ambulatory Visit
Admission: RE | Admit: 2021-07-31 | Discharge: 2021-07-31 | Disposition: A | Discharge status: Home / Self Care | Payer: Medicaid Other | Source: Ambulatory Visit | Attending: Family Medicine | Admitting: Family Medicine

## 2021-07-31 DIAGNOSIS — Z1231 Encounter for screening mammogram for malignant neoplasm of breast: Secondary | ICD-10-CM

## 2021-07-31 LAB — CMP14+EGFR
ALT: 13 IU/L (ref 0–32)
AST: 16 IU/L (ref 0–40)
Albumin/Globulin Ratio: 1.1 — ABNORMAL LOW (ref 1.2–2.2)
Albumin: 3.9 g/dL (ref 3.8–4.9)
Alkaline Phosphatase: 69 IU/L (ref 44–121)
BUN/Creatinine Ratio: 17 (ref 9–23)
BUN: 11 mg/dL (ref 6–24)
Bilirubin Total: 0.5 mg/dL (ref 0.0–1.2)
CO2: 27 mmol/L (ref 20–29)
Calcium: 9.2 mg/dL (ref 8.7–10.2)
Chloride: 104 mmol/L (ref 96–106)
Creatinine, Ser: 0.66 mg/dL (ref 0.57–1.00)
Globulin, Total: 3.5 g/dL (ref 1.5–4.5)
Glucose: 84 mg/dL (ref 70–99)
Potassium: 3.9 mmol/L (ref 3.5–5.2)
Sodium: 144 mmol/L (ref 134–144)
Total Protein: 7.4 g/dL (ref 6.0–8.5)
eGFR: 103 mL/min/{1.73_m2} (ref >59)

## 2021-07-31 LAB — LIPID PANEL
Chol/HDL Ratio: 3.2 ratio (ref 0.0–4.4)
Cholesterol, Total: 206 mg/dL — ABNORMAL HIGH (ref 100–199)
HDL: 65 mg/dL (ref >39)
LDL Chol Calc (NIH): 134 mg/dL — ABNORMAL HIGH (ref 0–99)
Triglycerides: 41 mg/dL (ref 0–149)
VLDL Cholesterol Cal: 7 mg/dL (ref 5–40)

## 2021-07-31 LAB — CBC WITH DIFFERENTIAL/PLATELET
Basophils Absolute: 0 10*3/uL (ref 0.0–0.2)
Basos: 1 %
EOS (ABSOLUTE): 0.2 10*3/uL (ref 0.0–0.4)
Eos: 5 %
Hematocrit: 37.6 % (ref 34.0–46.6)
Hemoglobin: 12.3 g/dL (ref 11.1–15.9)
Immature Grans (Abs): 0 10*3/uL (ref 0.0–0.1)
Immature Granulocytes: 0 %
Lymphocytes Absolute: 1.5 10*3/uL (ref 0.7–3.1)
Lymphs: 35 %
MCH: 29 pg (ref 26.6–33.0)
MCHC: 32.7 g/dL (ref 31.5–35.7)
MCV: 89 fL (ref 79–97)
Monocytes Absolute: 0.5 10*3/uL (ref 0.1–0.9)
Monocytes: 10 %
Neutrophils Absolute: 2.2 10*3/uL (ref 1.4–7.0)
Neutrophils: 49 %
Platelets: 276 10*3/uL (ref 150–450)
RBC: 4.24 x10E6/uL (ref 3.77–5.28)
RDW: 13.4 % (ref 11.7–15.4)
WBC: 4.4 10*3/uL (ref 3.4–10.8)

## 2021-07-31 LAB — HEPATITIS C ANTIBODY: Hep C Virus Ab: <0.1 s/co ratio (ref 0.0–0.9)

## 2021-08-18 ENCOUNTER — Other Ambulatory Visit: Payer: Self-pay | Admitting: Internal Medicine

## 2021-08-18 ENCOUNTER — Other Ambulatory Visit: Payer: Self-pay

## 2021-08-18 DIAGNOSIS — I10 Essential (primary) hypertension: Secondary | ICD-10-CM

## 2021-08-18 MED ORDER — LOSARTAN POTASSIUM 50 MG PO TABS
ORAL_TABLET | Freq: Every day | ORAL | 1 refills | Status: DC
  Filled 2021-08-18 – 2021-11-24 (×3): qty 90, 90d supply, fill #0

## 2021-08-25 ENCOUNTER — Other Ambulatory Visit: Payer: Self-pay

## 2021-08-26 ENCOUNTER — Other Ambulatory Visit: Payer: Self-pay

## 2021-09-01 ENCOUNTER — Encounter: Payer: Self-pay | Admitting: Family Medicine

## 2021-09-01 ENCOUNTER — Ambulatory Visit (HOSPITAL_COMMUNITY)
Admission: EM | Admit: 2021-09-01 | Discharge: 2021-09-01 | Disposition: A | Discharge status: Home / Self Care | Payer: Medicaid Other | Attending: Internal Medicine | Admitting: Internal Medicine

## 2021-09-01 ENCOUNTER — Ambulatory Visit (HOSPITAL_BASED_OUTPATIENT_CLINIC_OR_DEPARTMENT_OTHER)
Admit: 2021-09-01 | Discharge: 2021-09-01 | Disposition: A | Discharge status: Home / Self Care | Payer: Medicaid Other | Attending: Internal Medicine | Admitting: Internal Medicine

## 2021-09-01 ENCOUNTER — Other Ambulatory Visit: Payer: Self-pay

## 2021-09-01 ENCOUNTER — Encounter (HOSPITAL_COMMUNITY): Payer: Self-pay | Admitting: Emergency Medicine

## 2021-09-01 DIAGNOSIS — M5442 Lumbago with sciatica, left side: Secondary | ICD-10-CM | POA: Insufficient documentation

## 2021-09-01 DIAGNOSIS — M79605 Pain in left leg: Secondary | ICD-10-CM | POA: Diagnosis not present

## 2021-09-01 DIAGNOSIS — M7989 Other specified soft tissue disorders: Secondary | ICD-10-CM | POA: Insufficient documentation

## 2021-09-01 HISTORY — DX: Acute embolism and thrombosis of unspecified deep veins of unspecified lower extremity: I82.409

## 2021-09-01 MED ORDER — CYCLOBENZAPRINE HCL 10 MG PO TABS
10.0000 mg | ORAL_TABLET | Freq: Every evening | ORAL | 0 refills | Status: DC | PRN
  Filled 2021-09-01: qty 15, 15d supply, fill #0

## 2021-09-01 NOTE — ED Provider Notes (Signed)
[facility]    CSN: [id number] Arrival date & time: 09/01/21  1015      History   Chief Complaint Chief Complaint  Patient presents with   Hip Pain    HPI Carolyn Mcdonald is a 55 y.o. female.   Left Hip/Leg Pain Prior history of DVT about 1 year ago in left leg Currently on Xarelto, reports no missed doses Denies Chest pain/SOB Started having left posterior buttocks/lateral hip pain, with occasional shooting into the left anterior hip, down left leg Reports cramping into lower left leg Has noticed some increased tightness on the lateral aspect of her left lower leg that is new with this She is concerned she has another clot Has taken ibuprofen without improvement in her pain Denies saddle anesthesias, changes in bowel or bladder habits, leg weakness States that she has not had pain like this before   Past Medical History:  Diagnosis Date   DVT (deep venous thrombosis) ([place])    Fibroid    Hypertension     Patient Active Problem List   Diagnosis Date Noted   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   Obstructive sleep apnea 12/23/2020   S/P abdominal hysterectomy 07/21/2020   Abnormal uterine bleeding (AUB)    Prediabetes 01/31/2020   Vitamin D deficiency 01/31/2020   Essential hypertension 01/30/2020   Fibroid 01/30/2020   Class 3 severe obesity due to excess calories with serious comorbidity and body mass index (BMI) of 50.0 to 59.9 in adult [facility]) 01/30/2020    Past Surgical History:  Procedure Laterality Date   ANKLE SURGERY     R ankle    CESAREAN SECTION     x3   HYSTERECTOMY ABDOMINAL WITH SALPINGECTOMY Bilateral 07/21/2020   Procedure: SUPRACERVICAL HYSTERECTOMY  WITH BILATERAL PARTIAL SALPINGECTOMY;  Surgeon: Mora Bellman, MD;  Location: [facility];  Service: Gynecology;  Laterality: Bilateral;  tap block   SUPRACERVICAL ABDOMINAL HYSTERECTOMY  07/21/2020   TOE SURGERY     R Great toe    TUBAL LIGATION  1993    OB History      Gravida  3   Para      Term      Preterm      AB      Living         SAB      IAB      Ectopic      Multiple      Live Births  3            Home Medications    Prior to Admission medications   Medication Sig Start Date End Date Taking? Authorizing Provider  cyclobenzaprine (FLEXERIL) 10 MG tablet Take 1 tablet (10 mg total) by mouth at bedtime as needed for muscle spasms. 09/01/21  Yes Bernarda Erck, Bernita Raisin, DO  acetaminophen (TYLENOL) 650 MG CR tablet Take 650 mg by mouth every 8 (eight) hours as needed for pain.    [provider]  chlorhexidine (HIBICLENS) 4 % external liquid Apply topically daily as needed. 01/20/21   Volney American, PA-C  erythromycin ophthalmic ointment Place a 1/2 inch ribbon of ointment into the lower eyelid. Patient not taking: Reported on 09/01/2021 05/13/21   Raspet, Derry Skill, PA-C  hydrochlorothiazide (HYDRODIURIL) 25 MG tablet Take 1 tablet (25 mg total) by mouth daily. 07/23/21   Dorna Mai, MD  losartan (COZAAR) 50 MG tablet TAKE 1 TABLET BY MOUTH DAILY. 08/18/21 08/18/22  Dorna Mai, MD  Misc.  Devices MISC 1 each by Does not apply route at bedtime. - Trial of CPAP therapy on 11 cm H2O or autopap 5-15. - Patient used a Medium Wide size Philips Respironics Full Face Mask Dreamwear mask and heated humidification. 12/23/20   Camillia Herter, NP  Multiple Vitamin (MULTIVITAMIN) tablet Take 1 tablet by mouth daily.    [provider]  rivaroxaban (XARELTO) 20 MG TABS tablet TAKE 1 TABLET (20 MG TOTAL) BY MOUTH DAILY WITH SUPPER. 04/19/21 04/19/22  Mayers, Loraine Grip, PA-C    Family History History reviewed. No pertinent family history.  Social History Social History   Tobacco Use   Smoking status: Never   Smokeless tobacco: Never  Vaping Use   Vaping Use: Never used  Substance Use Topics   Alcohol use: Never   Drug use: Never     Allergies   Vicodin [hydrocodone-acetaminophen]   Review of Systems Review  of Systems  All other systems reviewed and are negative.  Per HPI Physical Exam Triage Vital Signs ED Triage Vitals [09/01/21 1134]  Enc Vitals Group     BP      Pulse      Resp      Temp      Temp src      SpO2      Weight      Height      Head Circumference      Peak Flow      Pain Score 9     Pain Loc      Pain Edu?      Excl. in [place]?    No data found.  Updated Vital Signs BP (!) 149/99 (BP Location: Right Arm) Comment (BP Location): regular cuff, forearm   Pulse 88    Temp 98.1 F (36.7 C) (Oral)    LMP 05/13/2020    SpO2 100%   Visual Acuity Right Eye Distance:   Left Eye Distance:   Bilateral Distance:    Right Eye Near:   Left Eye Near:    Bilateral Near:     Physical Exam Constitutional:      General: She is not in acute distress.    Appearance: Normal appearance. She is not ill-appearing or toxic-appearing.  HENT:     Head: Normocephalic and atraumatic.  Cardiovascular:     Rate and Rhythm: Normal rate.  Pulmonary:     Effort: Pulmonary effort is normal. No respiratory distress.  Musculoskeletal:     Left lower leg: Edema (slighlty more swollen LLE with some increased thickness of lateral skin of left leg with some TTP in this area as well) present.     Comments: Lumbar spine: - Inspection: no gross deformity or asymmetry, swelling or ecchymosis - Palpation: No TTP over the spinous processes, paraspinal muscles, or SI joints b/l - ROM: full active ROM of the lumbar spine in flexion and extension with pain with extension - Strength: 5/5 strength of lower extremity in L4-S1 nerve root distributions b/l; normal gait - Neuro: sensation intact in the L4-S1 nerve root distribution b/l, 2+ L4 and S1 reflexes - Special testing: Slightly positive seated slump on left    Skin:    General: Skin is warm and dry.     Findings: No erythema.  Neurological:     General: No focal deficit present.     Mental Status: She is alert and oriented to person, place, and  time.     UC Treatments / Results  Labs (all  labs ordered are listed, but only abnormal results are displayed) Labs Reviewed - No data to display  EKG   Radiology No results found.  Procedures Procedures (including critical care time)  Medications Ordered in UC Medications - No data to display  Initial Impression / Assessment and Plan / UC Course  I have reviewed the triage vital signs and the nursing notes.  Pertinent labs & imaging results that were available during my care of the patient were reviewed by me and considered in my medical decision making (see chart for details).     Most likely her symptoms are related to her back, however she does have a slightly larger left lower leg and there is some tenderness with palpation of her left lateral leg.  Given her prior history, although she has missed no doses of Xarelto, we will perform a DVT ultrasound to rule out an acute DVT.  She was given ED precautions for DVT or PE, see AVS.  Advised against use of ibuprofen and other NSAIDs while taking an anticoagulant.  Recommended topical Voltaren gel and advised to use Flexeril as needed at nighttime, did advise that this can increase somnolence.  Recommended follow-up with primary care provider in 1 week if not improving.   Final Clinical Impressions(s) / UC Diagnoses   Final diagnoses:  Acute left-sided low back pain with left-sided sciatica  Left leg swelling     Discharge Instructions      As we discussed, it is important that you go for an ultrasound of your left leg to make sure that you do not have another blood clot.  If you experience chest pain or difficulty breathing or significant swelling in your left leg with redness and pain, you need to go to the emergency room right away.  I suspect that most of this pain is coming from your back, I have sent a prescription for a muscle relaxer for you.  Remember that you should not take oral anti-inflammatories including  ibuprofen while you are on a blood thinner.  You can use topical Voltaren gel as needed for your pain which can be bought over-the-counter.  Heat can also be helpful.  Follow-up with your regular doctor if you are not improving over the next week.     ED Prescriptions     Medication Sig Dispense Auth. Provider   cyclobenzaprine (FLEXERIL) 10 MG tablet Take 1 tablet (10 mg total) by mouth at bedtime as needed for muscle spasms. 15 tablet Kainen Struckman, Bernita Raisin, DO      PDMP not reviewed this encounter.   Cleophas Dunker, DO 09/01/21 1208

## 2021-09-01 NOTE — ED Notes (Signed)
Appt scheduled with [facility] at 1400. Entrance C.

## 2021-09-01 NOTE — ED Triage Notes (Addendum)
Left back pain, left hip pain, pain radiates to knee, then pain travels to lateral left lower leg.  Patient complains of lower leg hurting and being firmer than right leg.  08/22/2021 was the onset date.    Took tylenol, ibuprofen and no relief  Taking xarelto for a year due to dvt in left leg

## 2021-09-01 NOTE — ED Notes (Signed)
Notified dr Signe Colt of patient history/complaint

## 2021-09-01 NOTE — Discharge Instructions (Addendum)
As we discussed, it is important that you go for an ultrasound of your left leg to make sure that you do not have another blood clot.  If you experience chest pain or difficulty breathing or significant swelling in your left leg with redness and pain, you need to go to the emergency room right away.  I suspect that most of this pain is coming from your back, I have sent a prescription for a muscle relaxer for you.  Remember that you should not take oral anti-inflammatories including ibuprofen while you are on a blood thinner.  You can use topical Voltaren gel as needed for your pain which can be bought over-the-counter.  Heat can also be helpful.  Follow-up with your regular doctor if you are not improving over the next week.

## 2021-09-01 NOTE — Progress Notes (Signed)
Left lower extremity venous duplex has been completed. Preliminary results can be found in CV Proc through chart review.  Results were given to Dr. Mannie Stabile.  09/01/21 2:30 PM Carlos Levering RVT

## 2021-09-07 ENCOUNTER — Telehealth: Payer: Self-pay | Admitting: Family Medicine

## 2021-09-07 NOTE — Telephone Encounter (Signed)
Patient was called and we discussed her option for pain until she can be seen

## 2021-09-07 NOTE — Telephone Encounter (Signed)
Patient was called and discuss with her options for her pain.

## 2021-09-07 NOTE — Telephone Encounter (Signed)
Pt states she went to Urgent care on the 21st last week due to the pain and was prescribed Rx #: [id number]  cyclobenzaprine (FLEXERIL) 10 MG tablet [[id number]]  but this has not improved. Pt is still having trouble through out the day with the spasms and hip pain and she's having too much pain trying to walk and pt doesn't know what to do. Pt seeking PCP's help w/ this pain and for something stronger to aid w/ the pain. Please advise and thank you.

## 2021-09-14 ENCOUNTER — Ambulatory Visit: Payer: Medicaid Other | Admitting: Family Medicine

## 2021-10-08 ENCOUNTER — Ambulatory Visit: Payer: Medicaid Other | Admitting: Family Medicine

## 2021-10-21 ENCOUNTER — Ambulatory Visit: Payer: Medicaid Other | Admitting: Family Medicine

## 2021-10-21 ENCOUNTER — Other Ambulatory Visit (HOSPITAL_COMMUNITY): Payer: Self-pay

## 2021-10-21 ENCOUNTER — Telehealth: Payer: Self-pay | Admitting: Family Medicine

## 2021-10-21 ENCOUNTER — Other Ambulatory Visit: Payer: Self-pay

## 2021-10-21 ENCOUNTER — Other Ambulatory Visit: Payer: Self-pay | Admitting: Physician Assistant

## 2021-10-21 DIAGNOSIS — Z86718 Personal history of other venous thrombosis and embolism: Secondary | ICD-10-CM

## 2021-10-21 MED ORDER — RIVAROXABAN 20 MG PO TABS
ORAL_TABLET | Freq: Every day | ORAL | 1 refills | Status: DC
  Filled 2021-10-21: qty 90, 90d supply, fill #0
  Filled 2022-01-20 – 2022-01-27 (×2): qty 90, 90d supply, fill #1

## 2021-10-21 NOTE — Telephone Encounter (Signed)
Rx #: [id number]  rivaroxaban (XARELTO) 20 MG TABS tablet [[id number]]   [facility] [address], [place] [address]

## 2021-10-22 ENCOUNTER — Other Ambulatory Visit (HOSPITAL_COMMUNITY): Payer: Self-pay

## 2021-10-31 ENCOUNTER — Other Ambulatory Visit: Payer: Self-pay | Admitting: Family Medicine

## 2021-10-31 DIAGNOSIS — I10 Essential (primary) hypertension: Secondary | ICD-10-CM

## 2021-11-01 ENCOUNTER — Other Ambulatory Visit (HOSPITAL_COMMUNITY): Payer: Self-pay

## 2021-11-01 MED ORDER — HYDROCHLOROTHIAZIDE 25 MG PO TABS
25.0000 mg | ORAL_TABLET | Freq: Every day | ORAL | 0 refills | Status: DC
  Filled 2021-11-01: qty 90, 90d supply, fill #0

## 2021-11-02 ENCOUNTER — Ambulatory Visit: Payer: Medicaid Other | Admitting: Family Medicine

## 2021-11-24 ENCOUNTER — Other Ambulatory Visit: Payer: Self-pay

## 2021-11-24 ENCOUNTER — Other Ambulatory Visit (HOSPITAL_COMMUNITY): Payer: Self-pay

## 2021-11-24 ENCOUNTER — Encounter (HOSPITAL_COMMUNITY): Payer: Self-pay

## 2021-11-24 ENCOUNTER — Ambulatory Visit (HOSPITAL_COMMUNITY)
Admission: EM | Admit: 2021-11-24 | Discharge: 2021-11-24 | Disposition: A | Discharge status: Home / Self Care | Payer: Medicaid Other | Attending: Family Medicine | Admitting: Family Medicine

## 2021-11-24 DIAGNOSIS — J029 Acute pharyngitis, unspecified: Secondary | ICD-10-CM | POA: Diagnosis not present

## 2021-11-24 DIAGNOSIS — K122 Cellulitis and abscess of mouth: Secondary | ICD-10-CM | POA: Diagnosis not present

## 2021-11-24 LAB — POCT RAPID STREP A, ED / UC: Streptococcus, Group A Screen (Direct): NEGATIVE

## 2021-11-24 MED ORDER — PREDNISONE 20 MG PO TABS
40.0000 mg | ORAL_TABLET | Freq: Every day | ORAL | 0 refills | Status: DC
  Filled 2021-11-24: qty 10, 5d supply, fill #0

## 2021-11-24 NOTE — Discharge Instructions (Signed)
You may use over the counter ibuprofen or acetaminophen as needed.  For a sore throat, over the counter products such as [employer] Peroxyl Mouth Sore Rinse or Chloraseptic Sore Throat Spray may provide some temporary relief. Your rapid strep test was negative today. We have sent your throat swab for culture and will let you know of any positive results. 

## 2021-11-24 NOTE — ED Triage Notes (Signed)
Pt presents for sore throat, ear pain and chills x 2 days.  ?

## 2021-11-24 NOTE — ED Provider Notes (Signed)
?[place] ? ? ?[id number] ?11/24/21 Arrival Time: [phone] ? ?ASSESSMENT & PLAN: ? ?1. Sore throat   ?2. Uvulitis   ? ?Rapid strep negative. Throat culture sent. ?Discussed typical duration of viral illnesses. ?Work note provided. ?OTC symptom care as needed. ? ?Meds ordered this encounter  ?Medications  ? predniSONE (DELTASONE) 20 MG tablet  ?  Sig: Take 2 tablets (40 mg total) by mouth daily.  ?  Dispense:  10 tablet  ?  Refill:  0  ? ? ? Follow-up Information   ? ? Dorna Mai, MD.   ?Specialty: Family Medicine ?Why: If worsening or failing to improve as anticipated. ?Contact information: ?Psychologist, clinical [address] ?[place] [address] ?[phone] ? ? ?  ?  ? ?  ?  ? ?  ? ? ?Reviewed expectations re: course of current medical issues. Questions answered. ?Outlined signs and symptoms indicating need for more acute intervention. ?Understanding verbalized. ?After Visit Summary given. ? ? ?SUBJECTIVE: ?History from: Patient. ?Carolyn Mcdonald is a 56 y.o. female. Reports: ST and "dangly thing swelling"; past 24 hours; with body aches. Afebrile. No resp symptoms. Denies: difficulty breathing. Normal PO intake without n/v/d. ? ?OBJECTIVE: ? ?Vitals:  ? 11/24/21 1117  ?BP: (!) 162/98  ?Pulse: 77  ?Resp: 16  ?Temp: 97.9 ?F (36.6 ?C)  ?TempSrc: Oral  ?SpO2: 100%  ?  ?General appearance: alert; no distress ?Eyes: PERRLA; EOMI; conjunctiva normal ?HENT: [place]; AT; with nasal congestion; throat with mild erythema; uvula is swollen and inflamed and midline ?Neck: supple  ?Lungs: speaks full sentences without difficulty; unlabored ?Extremities: no edema ?Skin: warm and dry ?Neurologic: normal gait ?Psychological: alert and cooperative; normal mood and affect ? ?Labs: ?Results for orders placed or performed during the hospital encounter of 11/24/21  ?POCT Rapid Strep A  ?Result Value Ref Range  ? Streptococcus, Group A Screen (Direct) NEGATIVE NEGATIVE  ? ?Labs Reviewed  ?CULTURE, GROUP A STREP [facility])  ?POCT RAPID  STREP A, ED / UC  ? ? ?Allergies  ?Allergen Reactions  ? Vicodin [Hydrocodone-Acetaminophen] Hives  ? ? ?Past Medical History:  ?Diagnosis Date  ? DVT (deep venous thrombosis) ([place])   ? Fibroid   ? Hypertension   ? ?Social History  ? ?Socioeconomic History  ? Marital status: Married  ?  Spouse name: Not on file  ? Number of children: Not on file  ? Years of education: Not on file  ? Highest education level: Not on file  ?Occupational History  ? Not on file  ?Tobacco Use  ? Smoking status: Never  ? Smokeless tobacco: Never  ?Vaping Use  ? Vaping Use: Never used  ?Substance and Sexual Activity  ? Alcohol use: Never  ? Drug use: Never  ? Sexual activity: Not Currently  ?  Birth control/protection: Surgical  ?Other Topics Concern  ? Not on file  ?Social History Narrative  ? Not on file  ? ?Social Determinants of Health  ? ?Financial Resource Strain: Not on file  ?Food Insecurity: Not on file  ?Transportation Needs: Not on file  ?Physical Activity: Not on file  ?Stress: Not on file  ?Social Connections: Not on file  ?Intimate Partner Violence: Not on file  ? ?History reviewed. No pertinent family history. ?Past Surgical History:  ?Procedure Laterality Date  ? ANKLE SURGERY    ? R ankle   ? CESAREAN SECTION    ? x3  ? HYSTERECTOMY ABDOMINAL WITH SALPINGECTOMY Bilateral 07/21/2020  ? Procedure: SUPRACERVICAL HYSTERECTOMY  WITH BILATERAL  PARTIAL SALPINGECTOMY;  Surgeon: Mora Bellman, MD;  Location: [place];  Service: Gynecology;  Laterality: Bilateral;  tap block  ? SUPRACERVICAL ABDOMINAL HYSTERECTOMY  07/21/2020  ? TOE SURGERY    ? R Great toe   ? TUBAL LIGATION  1993  ? ?  ?Vanessa Kick, MD ?11/24/21 1155 ? ?

## 2021-11-27 LAB — CULTURE, GROUP A STREP (THRC)

## 2022-01-21 ENCOUNTER — Other Ambulatory Visit (HOSPITAL_COMMUNITY): Payer: Self-pay

## 2022-01-21 ENCOUNTER — Ambulatory Visit: Payer: Self-pay

## 2022-01-21 NOTE — Telephone Encounter (Signed)
? ? ? ?  Chief Complaint: Heart rate has been lower than normal. ?Symptoms: Has felt anxious when this occurs. Lasts a few seconds. ?Frequency: Yesterday ?Pertinent Negatives: Patient denies chest pain. ?Disposition: '[]'$ ED /'[x]'$ Urgent Care (no appt availability in office) / '[]'$ Appointment(In office/virtual)/ '[]'$  [facility]/ '[]'$ Home Care/ '[]'$ Refused Recommended Disposition /'[]'$ [facility]/ '[]'$  Follow-up with PCP ?Additional Notes: No availability next week, pt. Going to UC.  ?Reason for Disposition ? [1] Palpitations AND [2] no improvement after using CARE ADVICE ? ?Answer Assessment - Initial Assessment Questions ?1. DESCRIPTION: "Please describe your heart rate or heartbeat that you are having" (e.g., fast/slow, regular/irregular, skipped or extra beats, "palpitations") ?    Lower heart rate - 64, 71 ?2. ONSET: "When did it start?" (Minutes, hours or days)  ?    Yesterday ?3. DURATION: "How long does it last" (e.g., seconds, minutes, hours) ?    Seconds ?4. PATTERN "Does it come and go, or has it been constant since it started?"  "Does it get worse with exertion?"   "Are you feeling it now?" ?    Comes and goes ?5. TAP: "Using your hand, can you tap out what you are feeling on a chair or table in front of you, so that I can hear?" (Note: not all patients can do this)   ?    No ?6. HEART RATE: "Can you tell me your heart rate?" "How many beats in 15 seconds?"  (Note: not all patients can do this)   ?    64-71 ?7. RECURRENT SYMPTOM: "Have you ever had this before?" If Yes, ask: "When was the last time?" and "What happened that time?"  ?    No ?8. CAUSE: "What do you think is causing the palpitations?" ?    Unsure ?9. CARDIAC HISTORY: "Do you have any history of heart disease?" (e.g., heart attack, angina, bypass surgery, angioplasty, arrhythmia)  ?    No ?10. OTHER SYMPTOMS: "Do you have any other symptoms?" (e.g., dizziness, chest pain, sweating, difficulty breathing) ?      Felt anxious ?11.  PREGNANCY: "Is there any chance you are pregnant?" "When was your last menstrual period?" ?      No ? ?Protocols used: Heart Rate and Heartbeat Questions-A-AH ? ?

## 2022-01-27 ENCOUNTER — Other Ambulatory Visit: Payer: Self-pay

## 2022-01-28 ENCOUNTER — Other Ambulatory Visit (HOSPITAL_COMMUNITY): Payer: Self-pay

## 2022-02-17 ENCOUNTER — Other Ambulatory Visit: Payer: Self-pay | Admitting: Family Medicine

## 2022-02-17 DIAGNOSIS — I10 Essential (primary) hypertension: Secondary | ICD-10-CM

## 2022-02-17 NOTE — Telephone Encounter (Signed)
Schedule appointment?

## 2022-02-18 ENCOUNTER — Other Ambulatory Visit: Payer: Self-pay

## 2022-02-18 NOTE — Telephone Encounter (Signed)
Patient returned call - she is concerned about her BP medication- she is requesting a RF until her appointment- 02/28/22.

## 2022-02-23 ENCOUNTER — Other Ambulatory Visit: Payer: Self-pay | Admitting: Family Medicine

## 2022-02-23 DIAGNOSIS — I10 Essential (primary) hypertension: Secondary | ICD-10-CM

## 2022-02-23 NOTE — Telephone Encounter (Signed)
Requested medications are due for refill today.  yes   Requested medications are on the active medications list.  yes   Last refill. 11/01/2021 #90 0 refills   Future visit scheduled.   yes   Notes to clinic.  Medication refill failed protocol d/t expired labs. PT has follow up appt in 5 days.   Requested Prescriptions  Pending Prescriptions Disp Refills   hydrochlorothiazide (HYDRODIURIL) 25 MG tablet 90 tablet 0    Sig: Take 1 tablet (25 mg total) by mouth daily.     Cardiovascular: Diuretics - Thiazide Failed - 02/23/2022  5:11 PM      Failed - Cr in normal range and within 180 days    Creatinine, Ser  Date Value Ref Range Status  07/30/2021 0.66 0.57 - 1.00 mg/dL Final         Failed - K in normal range and within 180 days    Potassium  Date Value Ref Range Status  07/30/2021 3.9 3.5 - 5.2 mmol/L Final         Failed - Na in normal range and within 180 days    Sodium  Date Value Ref Range Status  07/30/2021 144 134 - 144 mmol/L Final         Failed - Last BP in normal range    BP Readings from Last 1 Encounters:  11/24/21 (!) 162/98         Failed - Valid encounter within last 6 months    Recent Outpatient Visits           6 months ago Well woman exam (no gynecological exam)   Primary Care at [facility], MD   8 months ago COVID-19   Primary Care at [facility], Kriste Basque, NP   10 months ago Essential hypertension   Primary Care at [facility], MD   11 months ago Bilateral primary osteoarthritis of knee   Primary Care at [facility] [place], NP   1 year ago Bilateral primary osteoarthritis of knee   Primary Care at [facility], Bayard Beaver, MD       Future Appointments             In 5 days Dorna Mai, MD Primary Care at [facility]

## 2022-02-23 NOTE — Telephone Encounter (Signed)
Pt called to report that she is completley out of her current supply

## 2022-02-23 NOTE — Telephone Encounter (Signed)
Requested medications are due for refill today.  yes  Requested medications are on the active medications list.  yes  Last refill. 11/01/2021 #90 0 refills  Future visit scheduled.   yes  Notes to clinic.  Medication refill failed protocol d/t expired labs. PT has follow up appt in 5 days.    Requested Prescriptions  Pending Prescriptions Disp Refills   hydrochlorothiazide (HYDRODIURIL) 25 MG tablet 90 tablet 0    Sig: Take 1 tablet (25 mg total) by mouth daily.     Cardiovascular: Diuretics - Thiazide Failed - 02/23/2022  5:11 PM      Failed - Cr in normal range and within 180 days    Creatinine, Ser  Date Value Ref Range Status  07/30/2021 0.66 0.57 - 1.00 mg/dL Final         Failed - K in normal range and within 180 days    Potassium  Date Value Ref Range Status  07/30/2021 3.9 3.5 - 5.2 mmol/L Final         Failed - Na in normal range and within 180 days    Sodium  Date Value Ref Range Status  07/30/2021 144 134 - 144 mmol/L Final         Failed - Last BP in normal range    BP Readings from Last 1 Encounters:  11/24/21 (!) 162/98         Failed - Valid encounter within last 6 months    Recent Outpatient Visits           6 months ago Well woman exam (no gynecological exam)   Primary Care at [facility], MD   8 months ago COVID-19   Primary Care at [facility], Kriste Basque, NP   10 months ago Essential hypertension   Primary Care at [facility], MD   11 months ago Bilateral primary osteoarthritis of knee   Primary Care at [facility] [place], NP   1 year ago Bilateral primary osteoarthritis of knee   Primary Care at [facility], Bayard Beaver, MD       Future Appointments             In 5 days Dorna Mai, MD Primary Care at [facility]

## 2022-02-28 ENCOUNTER — Ambulatory Visit: Payer: Medicaid Other | Admitting: Family Medicine

## 2022-03-02 ENCOUNTER — Other Ambulatory Visit: Payer: Self-pay | Admitting: Family Medicine

## 2022-03-02 ENCOUNTER — Other Ambulatory Visit: Payer: Self-pay

## 2022-03-02 ENCOUNTER — Ambulatory Visit: Payer: Medicaid Other | Admitting: Critical Care Medicine

## 2022-03-02 VITALS — BP 160/110 | HR 78 | Temp 98.6°F | Resp 18 | Ht 63.0 in | Wt 311.0 lb

## 2022-03-02 DIAGNOSIS — Z86718 Personal history of other venous thrombosis and embolism: Secondary | ICD-10-CM | POA: Diagnosis not present

## 2022-03-02 DIAGNOSIS — Z6841 Body Mass Index (BMI) 40.0 and over, adult: Secondary | ICD-10-CM

## 2022-03-02 DIAGNOSIS — I10 Essential (primary) hypertension: Secondary | ICD-10-CM | POA: Diagnosis not present

## 2022-03-02 MED ORDER — VALSARTAN 80 MG PO TABS
80.0000 mg | ORAL_TABLET | Freq: Every day | ORAL | 3 refills | Status: DC
  Filled 2022-03-02: qty 60, 60d supply, fill #0

## 2022-03-02 MED ORDER — LOSARTAN POTASSIUM 50 MG PO TABS
ORAL_TABLET | Freq: Every day | ORAL | 0 refills | Status: DC
  Filled 2022-03-02: qty 30, 30d supply, fill #0

## 2022-03-02 MED ORDER — CARVEDILOL 12.5 MG PO TABS
12.5000 mg | ORAL_TABLET | Freq: Two times a day (BID) | ORAL | 3 refills | Status: DC
  Filled 2022-03-02: qty 60, 30d supply, fill #0
  Filled 2022-04-01: qty 60, 30d supply, fill #1
  Filled 2022-05-03: qty 60, 30d supply, fill #2
  Filled 2022-06-02: qty 60, 30d supply, fill #3

## 2022-03-02 NOTE — Progress Notes (Signed)
Acute Office Visit  Subjective:     Patient ID: Carolyn Mcdonald, female    DOB: [birth year], 56 y.o.   MRN: [id number]  Needing refill on medications associated hypertension  HPI Patient is in today for work in visit with mobile medicine for blood pressure medicine refills and hypertension assessment This patient's primary care provider is at primary care [place] Dr. Redmond Pulling  This is a pleasant 55 year old female seen today and her granddaughter is with her in accompaniment.  The patient last seen in the fall 2022 and is run out of her blood pressure medications.   She has difficulty taking these medicines does not take them every single day.  She is supposed to be on hydrochlorothiazide daily and losartan.  She states the losartan makes her drowsy and the hydrochlorothiazide also causes drowsiness.  She takes these at night it seems to be better and is not getting up at night as much.  She has a BMI of 55 her diet is not consistent.  Patient does not smoke or use tobacco products. Past history includes include sleep apnea not treated fibroid uterus morbid obesity prediabetes vitamin D deficiency prior history of DVT lower extremity currently on Xarelto Patient does get her medications at the clinic pharmacy.  The patient has Medicaid healthy blue  Note venous Doppler left lower extremity showed persistent popliteal vein thrombosis in December 2022 she is still on the Xarelto she will need refills  Past Medical History:  Diagnosis Date   DVT (deep venous thrombosis) ([place])    Fibroid    Hypertension      History reviewed. No pertinent family history.   Social History   Socioeconomic History   Marital status: Married    Spouse name: Not on file   Number of children: Not on file   Years of education: Not on file   Highest education level: Not on file  Occupational History   Not on file  Tobacco Use   Smoking status: Never   Smokeless tobacco: Never  Vaping Use   Vaping Use:  Never used  Substance and Sexual Activity   Alcohol use: Never   Drug use: Never   Sexual activity: Not Currently    Birth control/protection: Surgical  Other Topics Concern   Not on file  Social History Narrative   Not on file   Social Determinants of Health   Financial Resource Strain: Not on file  Food Insecurity: Not on file  Transportation Needs: Not on file  Physical Activity: Not on file  Stress: Not on file  Social Connections: Not on file  Intimate Partner Violence: Not on file     Allergies  Allergen Reactions   Vicodin [Hydrocodone-Acetaminophen] Hives     Outpatient Medications Prior to Visit  Medication Sig Dispense Refill   acetaminophen (TYLENOL) 650 MG CR tablet Take 650 mg by mouth every 8 (eight) hours as needed for pain.     Multiple Vitamin (MULTIVITAMIN) tablet Take 1 tablet by mouth daily.     rivaroxaban (XARELTO) 20 MG TABS tablet TAKE 1 TABLET (20 MG TOTAL) BY MOUTH DAILY WITH SUPPER. 90 tablet 1   hydrochlorothiazide (HYDRODIURIL) 25 MG tablet Take 1 tablet (25 mg total) by mouth daily. 90 tablet 0   losartan (COZAAR) 50 MG tablet TAKE 1 TABLET BY MOUTH DAILY. 30 tablet 0   cyclobenzaprine (FLEXERIL) 10 MG tablet Take 1 tablet (10 mg total) by mouth at bedtime as needed for muscle spasms. 15 tablet 0  chlorhexidine (HIBICLENS) 4 % external liquid Apply topically daily as needed. 120 mL 0   erythromycin ophthalmic ointment Place a 1/2 inch ribbon of ointment into the lower eyelid. (Patient not taking: Reported on 09/01/2021) 3.5 g 0   Misc. Devices MISC 1 each by Does not apply route at bedtime. - Trial of CPAP therapy on 11 cm H2O or autopap 5-15. - Patient used a Medium Wide size Philips Respironics Full Face Mask Dreamwear mask and heated humidification. 1 each 0   predniSONE (DELTASONE) 20 MG tablet Take 2 tablets (40 mg total) by mouth daily. 10 tablet 0   No facility-administered medications prior to visit.     Review of Systems   Constitutional:  Negative for chills, diaphoresis, fever, malaise/fatigue and weight loss.  HENT:  Negative for congestion, hearing loss, nosebleeds, sore throat and tinnitus.   Eyes:  Negative for blurred vision, photophobia and redness.  Respiratory:  Negative for cough, hemoptysis, sputum production, shortness of breath, wheezing and stridor.   Cardiovascular:  Positive for leg swelling. Negative for chest pain, palpitations, orthopnea, claudication and PND.  Gastrointestinal:  Negative for abdominal pain, blood in stool, constipation, diarrhea, heartburn, nausea and vomiting.  Genitourinary:  Negative for dysuria, flank pain, frequency, hematuria and urgency.  Musculoskeletal:  Negative for back pain, falls, joint pain, myalgias and neck pain.  Skin:  Negative for itching and rash.  Neurological:  Negative for dizziness, tingling, tremors, sensory change, speech change, focal weakness, seizures, loss of consciousness, weakness and headaches.  Endo/Heme/Allergies:  Negative for environmental allergies and polydipsia. Does not bruise/bleed easily.  Psychiatric/Behavioral:  Negative for depression, memory loss, substance abuse and suicidal ideas. The patient is nervous/anxious. The patient does not have insomnia.         Objective:    BP (!) 160/110   Pulse 78   Temp 98.6 F (37 C) (Oral)   Resp 18   Ht '5\' 3"'$  (1.6 m)   Wt (!) 311 lb (141.1 kg)   LMP 05/13/2020   SpO2 100%   BMI 55.09 kg/m    Physical Exam Vitals reviewed.  Constitutional:      Appearance: Normal appearance. She is well-developed. She is obese. She is not diaphoretic.  HENT:     Head: Normocephalic and atraumatic.     Nose: No nasal deformity, septal deviation, mucosal edema or rhinorrhea.     Right Sinus: No maxillary sinus tenderness or frontal sinus tenderness.     Left Sinus: No maxillary sinus tenderness or frontal sinus tenderness.     Mouth/Throat:     Pharynx: No oropharyngeal exudate.  Eyes:      General: No scleral icterus.    Conjunctiva/sclera: Conjunctivae normal.     Pupils: Pupils are equal, round, and reactive to light.  Neck:     Thyroid: No thyromegaly.     Vascular: No carotid bruit or JVD.     Trachea: Trachea normal. No tracheal tenderness or tracheal deviation.  Cardiovascular:     Rate and Rhythm: Normal rate and regular rhythm.     Chest Wall: PMI is not displaced.     Pulses: Normal pulses. No decreased pulses.     Heart sounds: Normal heart sounds, S1 normal and S2 normal. Heart sounds not distant. No murmur heard.    No systolic murmur is present.     No diastolic murmur is present.     No friction rub. No gallop. No S3 or S4 sounds.  Pulmonary:  Effort: No tachypnea, accessory muscle usage or respiratory distress.     Breath sounds: No stridor. No decreased breath sounds, wheezing, rhonchi or rales.  Chest:     Chest wall: No tenderness.  Abdominal:     General: Bowel sounds are normal. There is no distension.     Palpations: Abdomen is soft. Abdomen is not rigid.     Tenderness: There is no abdominal tenderness. There is no guarding or rebound.  Musculoskeletal:        General: Normal range of motion.     Cervical back: Normal range of motion and neck supple. No edema, erythema or rigidity. No muscular tenderness. Normal range of motion.  Lymphadenopathy:     Head:     Right side of head: No submental or submandibular adenopathy.     Left side of head: No submental or submandibular adenopathy.     Cervical: No cervical adenopathy.  Skin:    General: Skin is warm and dry.     Coloration: Skin is not pale.     Findings: No rash.     Nails: There is no clubbing.  Neurological:     Mental Status: She is alert and oriented to person, place, and time.     Sensory: No sensory deficit.  Psychiatric:        Speech: Speech normal.        Behavior: Behavior normal.     No results found for any visits on 03/02/22.      Assessment & Plan:    Problem List Items Addressed This Visit       Cardiovascular and Mediastinum   Essential hypertension    Multiple side effects from blood pressure medications we will switch this patient to valsartan 80 mg daily and carvedilol 12.5 mg twice daily discontinue further losartan and hydrochlorothiazide  I went over side effect potentials of the new medications and these were sent to our outpatient pharmacy  I asked she be brought into primary care [facility] for short-term follow-up in the next 3 to 6 weeks        Relevant Medications   valsartan (DIOVAN) 80 MG tablet   carvedilol (COREG) 12.5 MG tablet     Other   History of DVT of lower extremity    Patient is about to run out of the [place] she had DVT shown in December 2022 this will need to be reassessed I will go ahead and issue another refill of the Xarelto       Meds ordered this encounter  Medications   valsartan (DIOVAN) 80 MG tablet    Sig: Take 1 tablet (80 mg total) by mouth daily.    Dispense:  60 tablet    Refill:  3   carvedilol (COREG) 12.5 MG tablet    Sig: Take 1 tablet (12.5 mg total) by mouth 2 (two) times daily with a meal.    Dispense:  60 tablet    Refill:  3    Patient to follow-up with a primary care [place] in 3 to 6 weeks  Asencion Noble, MD

## 2022-03-02 NOTE — Progress Notes (Signed)
Patient takes BP medication at night. Patient denies pain at this time.

## 2022-03-02 NOTE — Telephone Encounter (Signed)
Pt has future OV scheduled, will refill for 30 days.  Requested Prescriptions  Pending Prescriptions Disp Refills  . losartan (COZAAR) 50 MG tablet 90 tablet 1    Sig: TAKE 1 TABLET BY MOUTH DAILY.     Cardiovascular:  Angiotensin Receptor Blockers Failed - 03/02/2022  6:23 AM      Failed - Cr in normal range and within 180 days    Creatinine, Ser  Date Value Ref Range Status  07/30/2021 0.66 0.57 - 1.00 mg/dL Final         Failed - K in normal range and within 180 days    Potassium  Date Value Ref Range Status  07/30/2021 3.9 3.5 - 5.2 mmol/L Final         Failed - Last BP in normal range    BP Readings from Last 1 Encounters:  11/24/21 (!) 162/98         Failed - Valid encounter within last 6 months    Recent Outpatient Visits          7 months ago Well woman exam (no gynecological exam)   Primary Care at [facility], MD   8 months ago COVID-19   Primary Care at [facility], Kriste Basque, NP   10 months ago Essential hypertension   Primary Care at [facility], MD   11 months ago Bilateral primary osteoarthritis of knee   Primary Care at [facility], Amy J, NP   1 year ago Bilateral primary osteoarthritis of knee   Primary Care at [facility], Bayard Beaver, MD      Future Appointments            In 4 weeks Dorna Mai, MD Primary Care at [facility] - Patient is not pregnant

## 2022-03-02 NOTE — Patient Instructions (Signed)
Stop losartan and HCTZ  Start carvedilol one pill twice a day  Start Valsartan '80mg'$  one pill daily  Appt with Dr Redmond Pulling will be made in 6 weeks

## 2022-03-03 ENCOUNTER — Other Ambulatory Visit: Payer: Self-pay

## 2022-03-03 MED ORDER — RIVAROXABAN 20 MG PO TABS
ORAL_TABLET | Freq: Every day | ORAL | 1 refills | Status: DC
  Filled 2022-03-03: qty 90, fill #0
  Filled 2022-05-03: qty 90, 90d supply, fill #0
  Filled 2022-08-03: qty 30, 30d supply, fill #1
  Filled 2022-09-07: qty 30, 30d supply, fill #2
  Filled 2022-10-07: qty 30, 30d supply, fill #3

## 2022-03-03 NOTE — Assessment & Plan Note (Signed)
Patient is about to run out of the [place] she had DVT shown in December 2022 this will need to be reassessed I will go ahead and issue another refill of the Xarelto

## 2022-03-03 NOTE — Assessment & Plan Note (Signed)
Multiple side effects from blood pressure medications we will switch this patient to valsartan 80 mg daily and carvedilol 12.5 mg twice daily discontinue further losartan and hydrochlorothiazide  I went over side effect potentials of the new medications and these were sent to our outpatient pharmacy  I asked she be brought into primary care [place] for short-term follow-up in the next 3 to 6 weeks

## 2022-03-08 ENCOUNTER — Other Ambulatory Visit: Payer: Self-pay

## 2022-03-31 ENCOUNTER — Ambulatory Visit: Payer: Medicaid Other | Admitting: Family Medicine

## 2022-04-01 ENCOUNTER — Other Ambulatory Visit: Payer: Self-pay

## 2022-04-04 ENCOUNTER — Other Ambulatory Visit: Payer: Self-pay

## 2022-04-20 ENCOUNTER — Ambulatory Visit: Payer: Medicaid Other | Admitting: Family Medicine

## 2022-05-03 ENCOUNTER — Other Ambulatory Visit: Payer: Self-pay

## 2022-05-03 ENCOUNTER — Encounter: Payer: Self-pay | Admitting: Family Medicine

## 2022-05-03 ENCOUNTER — Ambulatory Visit: Payer: Medicaid Other | Admitting: Family Medicine

## 2022-05-03 VITALS — BP 141/86 | HR 68 | Temp 97.7°F | Resp 16 | Wt 306.8 lb

## 2022-05-03 DIAGNOSIS — I10 Essential (primary) hypertension: Secondary | ICD-10-CM | POA: Diagnosis not present

## 2022-05-03 DIAGNOSIS — Z6841 Body Mass Index (BMI) 40.0 and over, adult: Secondary | ICD-10-CM

## 2022-05-03 MED ORDER — VALSARTAN 160 MG PO TABS
160.0000 mg | ORAL_TABLET | Freq: Every day | ORAL | 0 refills | Status: DC
  Filled 2022-05-03: qty 90, 90d supply, fill #0

## 2022-05-03 NOTE — Progress Notes (Unsigned)
Established Patient Office Visit  Subjective    Patient ID: Carolyn Mcdonald, female    DOB: [birth year]  Age: 56 y.o. MRN: [id number]  CC:  Chief Complaint  Patient presents with   Follow-up    HPI Carolyn Mcdonald presents for follow up of hypertension. Patient denies acute complaints.    Outpatient Encounter Medications as of 05/03/2022  Medication Sig   acetaminophen (TYLENOL) 650 MG CR tablet Take 650 mg by mouth every 8 (eight) hours as needed for pain.   carvedilol (COREG) 12.5 MG tablet Take 1 tablet (12.5 mg total) by mouth 2 (two) times daily with a meal.   cyclobenzaprine (FLEXERIL) 10 MG tablet Take 1 tablet (10 mg total) by mouth at bedtime as needed for muscle spasms.   Multiple Vitamin (MULTIVITAMIN) tablet Take 1 tablet by mouth daily.   rivaroxaban (XARELTO) 20 MG TABS tablet TAKE 1 TABLET (20 MG TOTAL) BY MOUTH DAILY WITH SUPPER.   valsartan (DIOVAN) 160 MG tablet Take 1 tablet (160 mg total) by mouth daily.   [DISCONTINUED] valsartan (DIOVAN) 80 MG tablet Take 1 tablet (80 mg total) by mouth daily.   No facility-administered encounter medications on file as of 05/03/2022.    Past Medical History:  Diagnosis Date   DVT (deep venous thrombosis) (HCC)    Fibroid    Hypertension     Past Surgical History:  Procedure Laterality Date   ANKLE SURGERY     R ankle    CESAREAN SECTION     x3   HYSTERECTOMY ABDOMINAL WITH SALPINGECTOMY Bilateral 07/21/2020   Procedure: SUPRACERVICAL HYSTERECTOMY  WITH BILATERAL PARTIAL SALPINGECTOMY;  Surgeon: Mora Bellman, MD;  Location: [place];  Service: Gynecology;  Laterality: Bilateral;  tap block   SUPRACERVICAL ABDOMINAL HYSTERECTOMY  07/21/2020   TOE SURGERY     R Great toe    TUBAL LIGATION  1993    History reviewed. No pertinent family history.  Social History   Socioeconomic History   Marital status: Married    Spouse name: Not on file   Number of children: Not on file   Years of education: Not on file    Highest education level: Not on file  Occupational History   Not on file  Tobacco Use   Smoking status: Never   Smokeless tobacco: Never  Vaping Use   Vaping Use: Never used  Substance and Sexual Activity   Alcohol use: Never   Drug use: Never   Sexual activity: Not Currently    Birth control/protection: Surgical  Other Topics Concern   Not on file  Social History Narrative   Not on file   Social Determinants of Health   Financial Resource Strain: Not on file  Food Insecurity: Not on file  Transportation Needs: Not on file  Physical Activity: Not on file  Stress: Not on file  Social Connections: Not on file  Intimate Partner Violence: Not on file    Review of Systems  All other systems reviewed and are negative.       Objective    BP (!) 141/86   Pulse 68   Temp 97.7 F (36.5 C) (Oral)   Resp 16   Wt (!) 306 lb 12.8 oz (139.2 kg)   LMP 05/13/2020   SpO2 96%   BMI 54.35 kg/m   Physical Exam Vitals and nursing note reviewed.  Constitutional:      General: She is not in acute distress.    Appearance: She is obese.  Cardiovascular:  Rate and Rhythm: Normal rate and regular rhythm.  Pulmonary:     Effort: Pulmonary effort is normal.     Breath sounds: Normal breath sounds.  Abdominal:     Palpations: Abdomen is soft.     Tenderness: There is no abdominal tenderness.  Musculoskeletal:     Right lower leg: No edema.     Left lower leg: No edema.  Neurological:     General: No focal deficit present.     Mental Status: She is alert and oriented to person, place, and time.     {Labs (Optional):23779}    Assessment & Plan:   1. Essential hypertension Elevated reading. Will increase valsartan from 80 mg to 160 mg daily and monitor  2. Class 3 severe obesity due to excess calories with serious comorbidity and body mass index (BMI) of 50.0 to 59.9 in adult [facility]) Discussed dietary and activity options.     Return in about 3 months (around  08/03/2022) for follow up, physical.   Becky Sax, MD

## 2022-05-03 NOTE — Progress Notes (Unsigned)
Patient is her for follow-up  HTN Patient has no new concerns today.

## 2022-06-02 ENCOUNTER — Ambulatory Visit (HOSPITAL_COMMUNITY)
Admission: EM | Admit: 2022-06-02 | Discharge: 2022-06-02 | Disposition: A | Discharge status: Home / Self Care | Payer: BC Managed Care – PPO | Attending: Internal Medicine | Admitting: Internal Medicine

## 2022-06-02 ENCOUNTER — Encounter (HOSPITAL_COMMUNITY): Payer: Self-pay

## 2022-06-02 DIAGNOSIS — M5431 Sciatica, right side: Secondary | ICD-10-CM | POA: Diagnosis not present

## 2022-06-02 MED ORDER — PREDNISONE 20 MG PO TABS: 40.0000 mg | ORAL_TABLET | Freq: Every day | ORAL | 0 refills | Status: AC

## 2022-06-02 MED ORDER — METHYLPREDNISOLONE SODIUM SUCC 125 MG IJ SOLR
80.0000 mg | Freq: Once | INTRAMUSCULAR | Status: AC
  Administered 2022-06-02: 80 mg via INTRAMUSCULAR

## 2022-06-02 MED ORDER — METHYLPREDNISOLONE SODIUM SUCC 125 MG IJ SOLR
INTRAMUSCULAR | Status: AC
  Filled 2022-06-02: qty 2

## 2022-06-02 NOTE — Discharge Instructions (Addendum)
Start taking prednisone '40mg'$  once daily for the next 5 days with breakfast. Take this with food to avoid stomach upset. You may start this in the morning.   Stop this medication if you develop any dark or tarry stools as you may have bleeding from your GI tract since you are on xarelto and prednisone increases risk for GI bleeding. To prevent this, take this medication with food.   No ibuprofen while you are taking steroid. You may take tylenol every 6 hours as needed for any other pain you may have.  Apply heat and perform gentle range of motion exercises to the right lower back.   If you develop any new or worsening symptoms or do not improve in the next 2 to 3 days, please return.  If your symptoms are severe, please go to the emergency room.  Follow-up with your primary care provider for further evaluation and management of your symptoms as well as ongoing wellness visits.  I hope you feel better!

## 2022-06-02 NOTE — ED Provider Notes (Signed)
[place]    CSN: [id number] Arrival date & time: 06/02/22  1844      History   Chief Complaint Chief Complaint  Patient presents with   Hip Pain    HPI Carolyn Mcdonald is a 56 y.o. female.   Patient presents to urgent care for evaluation of atraumatic right hip pain that started 2 days ago.  Patient states that the pain is "in the right hip socket, goes down to the back, then goes down the back of the leg and down into the foot/heel." Pain is significant to the point of causing nausea and extreme discomfort causing her to have a difficult time finding a position of comfort. Discomfort to the right hip is constant and is not relieved or improved with position changes. States she slept on the couch 2 days ago and believes this may have made her symptoms worse. Reports sleep disturbance due to discomfort and pain as well. No recent falls, injuries, surgeries, bladder/bowel incontinence, fever/chills, vomiting, constipation, diarrhea, shortness of breath, chest pain, weakness, or history of same type of pain. She is not a diabetic, but reports history of hypertension. Pain is currently a 10 on a scale of 0-10 and has not improved with tylenol and heat use at home.   Hip Pain    Past Medical History:  Diagnosis Date   DVT (deep venous thrombosis) ([place])    Fibroid    Hypertension     Patient Active Problem List   Diagnosis Date Noted   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   Obstructive sleep apnea 12/23/2020   S/P abdominal hysterectomy 07/21/2020   Prediabetes 01/31/2020   Vitamin D deficiency 01/31/2020   Essential hypertension 01/30/2020   Class 3 severe obesity due to excess calories with serious comorbidity and body mass index (BMI) of 50.0 to 59.9 in adult [facility]) 01/30/2020    Past Surgical History:  Procedure Laterality Date   ANKLE SURGERY     R ankle    CESAREAN SECTION     x3   HYSTERECTOMY ABDOMINAL WITH SALPINGECTOMY Bilateral 07/21/2020    Procedure: SUPRACERVICAL HYSTERECTOMY  WITH BILATERAL PARTIAL SALPINGECTOMY;  Surgeon: Mora Bellman, MD;  Location: [place];  Service: Gynecology;  Laterality: Bilateral;  tap block   SUPRACERVICAL ABDOMINAL HYSTERECTOMY  07/21/2020   TOE SURGERY     R Great toe    TUBAL LIGATION  1993    OB History     Gravida  3   Para      Term      Preterm      AB      Living         SAB      IAB      Ectopic      Multiple      Live Births  3            Home Medications    Prior to Admission medications   Medication Sig Start Date End Date Taking? Authorizing Provider  predniSONE (DELTASONE) 20 MG tablet Take 2 tablets (40 mg total) by mouth daily for 5 days. 06/02/22 06/07/22 Yes StanhopeStasia Cavalier, FNP  acetaminophen (TYLENOL) 650 MG CR tablet Take 650 mg by mouth every 8 (eight) hours as needed for pain.    [provider]  carvedilol (COREG) 12.5 MG tablet Take 1 tablet (12.5 mg total) by mouth 2 (two) times daily with a meal. 03/02/22   Elsie Stain, MD  Multiple  Vitamin (MULTIVITAMIN) tablet Take 1 tablet by mouth daily.    [provider]  rivaroxaban (XARELTO) 20 MG TABS tablet TAKE 1 TABLET (20 MG TOTAL) BY MOUTH DAILY WITH SUPPER. 03/03/22 03/03/23  Elsie Stain, MD  valsartan (DIOVAN) 160 MG tablet Take 1 tablet (160 mg total) by mouth daily. 05/03/22   Dorna Mai, MD    Family History No family history on file.  Social History Social History   Tobacco Use   Smoking status: Never   Smokeless tobacco: Never  Vaping Use   Vaping Use: Never used  Substance Use Topics   Alcohol use: Never   Drug use: Never     Allergies   Vicodin [hydrocodone-acetaminophen]   Review of Systems Review of Systems Per HPI  Physical Exam Triage Vital Signs ED Triage Vitals  Enc Vitals Group     BP 06/02/22 1950 (!) 197/106     Pulse Rate 06/02/22 1950 71     Resp 06/02/22 1950 18     Temp 06/02/22 1950 98.5 F (36.9 C)     Temp  Source 06/02/22 1950 Oral     SpO2 06/02/22 1950 98 %     Weight --      Height --      Head Circumference --      Peak Flow --      Pain Score 06/02/22 1948 10     Pain Loc --      Pain Edu? --      Excl. in [place]? --    No data found.  Updated Vital Signs BP (!) 197/106 (BP Location: Right Arm)   Pulse 71   Temp 98.5 F (36.9 C) (Oral)   Resp 18   LMP 05/13/2020   SpO2 98%   Visual Acuity Right Eye Distance:   Left Eye Distance:   Bilateral Distance:    Right Eye Near:   Left Eye Near:    Bilateral Near:     Physical Exam Vitals and nursing note reviewed.  Constitutional:      Appearance: Normal appearance. She is not toxic-appearing.     Comments: Patient appears to be uncomfortable and is having a difficult time finding a position of comfort in exam room to relieve pain.  HENT:     Head: Normocephalic and atraumatic.     Right Ear: Hearing and external ear normal.     Left Ear: Hearing and external ear normal.     Nose: Nose normal.     Mouth/Throat:     Lips: Pink.     Mouth: Mucous membranes are moist.  Eyes:     General: Lids are normal. Vision grossly intact. Gaze aligned appropriately.     Extraocular Movements: Extraocular movements intact.     Conjunctiva/sclera: Conjunctivae normal.  Cardiovascular:     Rate and Rhythm: Normal rate and regular rhythm.     Heart sounds: Normal heart sounds, S1 normal and S2 normal.  Pulmonary:     Effort: Pulmonary effort is normal. No respiratory distress.     Breath sounds: Normal breath sounds and air entry.  Abdominal:     Palpations: Abdomen is soft.  Musculoskeletal:     Cervical back: Neck supple.     Comments: Significant pain with palpation at the sciatic nerve on the right side with radiculopathy elicited with palpation.  No obvious evidence of deformity, injury, swelling, redness, or ecchymosis to area of greatest tenderness.  Range of motion is limited due to  tenderness.   Skin:    General: Skin is warm  and dry.     Capillary Refill: Capillary refill takes less than 2 seconds.     Findings: No rash.  Neurological:     General: No focal deficit present.     Mental Status: She is alert and oriented to person, place, and time. Mental status is at baseline.     Cranial Nerves: No dysarthria or facial asymmetry.     Gait: Gait is intact.     Comments: 5/5 strength against dorsi flexion and plantarflexion of bilateral lower extremities with sensation intact distal to injury.  Psychiatric:        Mood and Affect: Mood normal.        Speech: Speech normal.        Behavior: Behavior normal.        Thought Content: Thought content normal.        Judgment: Judgment normal.      UC Treatments / Results  Labs (all labs ordered are listed, but only abnormal results are displayed) Labs Reviewed - No data to display  EKG   Radiology No results found.  Procedures Procedures (including critical care time)  Medications Ordered in UC Medications  methylPREDNISolone sodium succinate (SOLU-MEDROL) 125 mg/2 mL injection 80 mg (80 mg Intramuscular Given 06/02/22 2025)    Initial Impression / Assessment and Plan / UC Course  I have reviewed the triage vital signs and the nursing notes.  Pertinent labs & imaging results that were available during my care of the patient were reviewed by me and considered in my medical decision making (see chart for details).   1.  Sciatica of the right side Symptoms and physical exam are consistent with acute sciatic nerve pain that will likely improve with anti-inflammatory medication.  80 mg Solu-Medrol injection given in clinic with plan to begin prednisone 40 mg starting tomorrow for the next 5 days with breakfast.  No NSAID medications while taking prednisone due to increased risk of GI bleeding.  Patient does take Eliquis and has been given strict precautions regarding GI bleeding and dark/tarry stools.  If she develops signs of upper/lower GI bleed, she is to  stop taking prednisone and medical care.  Advised to take this medication with food to prevent possible GI bleeding.  Heat and gentle range of motion exercises advised.  Advised to follow-up with PCP or return to urgent care if symptoms fail to improve 2 to 3 days with steroid use.   Discussed physical exam and available lab work findings in clinic with patient.  Counseled patient regarding appropriate use of medications and potential side effects for all medications recommended or prescribed today. Discussed red flag signs and symptoms of worsening condition,when to call the PCP office, return to urgent care, and when to seek higher level of care in the emergency department. Patient verbalizes understanding and agreement with plan. All questions answered. Patient discharged in stable condition.  Final Clinical Impressions(s) / UC Diagnoses   Final diagnoses:  Sciatica of right side     Discharge Instructions      Start taking prednisone '40mg'$  once daily for the next 5 days with breakfast. Take this with food to avoid stomach upset. You may start this in the morning.   Stop this medication if you develop any dark or tarry stools as you may have bleeding from your GI tract since you are on xarelto and prednisone increases risk for GI bleeding. To prevent this,  take this medication with food.   No ibuprofen while you are taking steroid. You may take tylenol every 6 hours as needed for any other pain you may have.  Apply heat and perform gentle range of motion exercises to the right lower back.   If you develop any new or worsening symptoms or do not improve in the next 2 to 3 days, please return.  If your symptoms are severe, please go to the emergency room.  Follow-up with your primary care provider for further evaluation and management of your symptoms as well as ongoing wellness visits.  I hope you feel better!     ED Prescriptions     Medication Sig Dispense Auth. Provider   predniSONE  (DELTASONE) 20 MG tablet Take 2 tablets (40 mg total) by mouth daily for 5 days. 10 tablet Talbot Grumbling, FNP      PDMP not reviewed this encounter.   Talbot Grumbling, [place] 06/04/22 1434

## 2022-06-02 NOTE — ED Triage Notes (Signed)
Patient with c/o right hip pain radiating down into her leg and foot. Patient states it started 2 days ago. Pain has been getting worse since.

## 2022-06-03 ENCOUNTER — Other Ambulatory Visit: Payer: Self-pay

## 2022-06-22 ENCOUNTER — Ambulatory Visit: Payer: Self-pay | Admitting: *Deleted

## 2022-06-22 NOTE — Telephone Encounter (Signed)
  Chief Complaint: nerve pain- R hip,buttock, leg Symptoms: pain in hip that radiates into buttock and leg Frequency: 2 weeks Pertinent Negatives: Patient denies   Disposition: '[]'$ ED /'[]'$ Urgent Care (no appt availability in office) / '[]'$ Appointment(In office/virtual)/ '[]'$  [facility]/ '[]'$ Home Care/ '[]'$ Refused Recommended Disposition /'[]'$ [facility]/ '[]'$  Follow-up with PCP Additional Notes: Offered appointment nest day afternoon but patient states she works and needs a morning appointment- 9:30-12 are her available times. Patient advised I would send message to see if she can be scheduled for early day.

## 2022-06-22 NOTE — Telephone Encounter (Signed)
Summary: Sciatic Nerve pain in hips and legs   Pt has sciatic nerve pain in her hips and legs, has already went to urgent care and what she was prescribed does not seem to help. Seeking alternative options   Best contact: [phone]      Reason for Disposition  [1] SEVERE pain (e.g., excruciating, unable to do any normal activities) AND [2] not improved after 2 hours of pain medicine  Answer Assessment - Initial Assessment Questions 1. LOCATION and RADIATION: "Where is the pain located?"      R hip, buttock to  inner leg to heel 2. QUALITY: "What does the pain feel like?"  (e.g., sharp, dull, aching, burning)     Depends- can be burning, stabbing, sharp/dull 3. SEVERITY: "How bad is the pain?" "What does it keep you from doing?"   (Scale 1-10; or mild, moderate, severe)   -  MILD (1-3): doesn't interfere with normal activities    -  MODERATE (4-7): interferes with normal activities (e.g., work or school) or awakens from sleep, limping    -  SEVERE (8-10): excruciating pain, unable to do any normal activities, unable to walk     Moderate/severe 4. ONSET: "When did the pain start?" "Does it come and go, or is it there all the time?"     UC -2 weeks ago diagnosed sciatic nerve 5. WORK OR EXERCISE: "Has there been any recent work or exercise that involved this part of the body?"      Drives bus- active 6. CAUSE: "What do you think is causing the hip pain?"      Sciatic nerve pain 7. AGGRAVATING FACTORS: "What makes the hip pain worse?" (e.g., walking, climbing stairs, running)     Patient states walking, wakes her up at night 8. OTHER SYMPTOMS: "Do you have any other symptoms?" (e.g., back pain, pain shooting down leg,  fever, rash)     Pain down leg  Protocols used: Hip Pain-A-AH

## 2022-06-23 NOTE — Telephone Encounter (Signed)
Patient given appt 

## 2022-06-24 ENCOUNTER — Telehealth: Payer: Self-pay | Admitting: Family Medicine

## 2022-06-24 ENCOUNTER — Other Ambulatory Visit: Payer: Self-pay

## 2022-06-24 ENCOUNTER — Ambulatory Visit (INDEPENDENT_AMBULATORY_CARE_PROVIDER_SITE_OTHER): Payer: BC Managed Care – PPO | Admitting: Family Medicine

## 2022-06-24 ENCOUNTER — Encounter: Payer: Self-pay | Admitting: Family Medicine

## 2022-06-24 VITALS — BP 159/99 | HR 75 | Temp 97.7°F | Resp 16 | Wt 300.2 lb

## 2022-06-24 DIAGNOSIS — G8929 Other chronic pain: Secondary | ICD-10-CM

## 2022-06-24 DIAGNOSIS — I10 Essential (primary) hypertension: Secondary | ICD-10-CM

## 2022-06-24 DIAGNOSIS — M5441 Lumbago with sciatica, right side: Secondary | ICD-10-CM

## 2022-06-24 DIAGNOSIS — Z6841 Body Mass Index (BMI) 40.0 and over, adult: Secondary | ICD-10-CM

## 2022-06-24 MED ORDER — GABAPENTIN 300 MG PO CAPS
300.0000 mg | ORAL_CAPSULE | Freq: Three times a day (TID) | ORAL | 3 refills | Status: DC
  Filled 2022-06-24: qty 90, 30d supply, fill #0
  Filled 2023-03-18: qty 90, 30d supply, fill #1
  Filled 2023-06-21: qty 90, 30d supply, fill #2

## 2022-06-24 NOTE — Telephone Encounter (Signed)
Copied from [place] [id number]. Topic: Appointment Scheduling - Scheduling Inquiry for Clinic >> Jun 24, 2022 10:45 AM Cyndi Bender wrote: Reason for CRM: Pt stated she is on her way for her appt. Pt informed of late policy

## 2022-06-28 ENCOUNTER — Encounter: Payer: Self-pay | Admitting: Family Medicine

## 2022-06-28 NOTE — Progress Notes (Signed)
Established Patient Office Visit  Subjective    Patient ID: Carolyn Mcdonald, female    DOB: 07/06/1966  Age: 56 y.o. MRN: [id number]  CC:  Chief Complaint  Patient presents with   Follow-up   Hypertension   Hip Pain    HPI Lacora Folmer presents for follow up of hypertension.    Outpatient Encounter Medications as of 06/24/2022  Medication Sig   gabapentin (NEURONTIN) 300 MG capsule Take 1 capsule (300 mg total) by mouth 3 (three) times daily.   acetaminophen (TYLENOL) 650 MG CR tablet Take 650 mg by mouth every 8 (eight) hours as needed for pain.   carvedilol (COREG) 12.5 MG tablet Take 1 tablet (12.5 mg total) by mouth 2 (two) times daily with a meal.   Multiple Vitamin (MULTIVITAMIN) tablet Take 1 tablet by mouth daily.   rivaroxaban (XARELTO) 20 MG TABS tablet TAKE 1 TABLET (20 MG TOTAL) BY MOUTH DAILY WITH SUPPER.   valsartan (DIOVAN) 160 MG tablet Take 1 tablet (160 mg total) by mouth daily.   No facility-administered encounter medications on file as of 06/24/2022.    Past Medical History:  Diagnosis Date   DVT (deep venous thrombosis) (HCC)    Fibroid    Hypertension     Past Surgical History:  Procedure Laterality Date   ANKLE SURGERY     R ankle    CESAREAN SECTION     x3   HYSTERECTOMY ABDOMINAL WITH SALPINGECTOMY Bilateral 07/21/2020   Procedure: SUPRACERVICAL HYSTERECTOMY  WITH BILATERAL PARTIAL SALPINGECTOMY;  Surgeon: Mora Bellman, MD;  Location: [place];  Service: Gynecology;  Laterality: Bilateral;  tap block   SUPRACERVICAL ABDOMINAL HYSTERECTOMY  07/21/2020   TOE SURGERY     R Great toe    TUBAL LIGATION  1993    History reviewed. No pertinent family history.  Social History   Socioeconomic History   Marital status: Married    Spouse name: Not on file   Number of children: Not on file   Years of education: Not on file   Highest education level: Not on file  Occupational History   Not on file  Tobacco Use   Smoking status: Never    Smokeless tobacco: Never  Vaping Use   Vaping Use: Never used  Substance and Sexual Activity   Alcohol use: Never   Drug use: Never   Sexual activity: Not Currently    Birth control/protection: Surgical  Other Topics Concern   Not on file  Social History Narrative   Not on file   Social Determinants of Health   Financial Resource Strain: Not on file  Food Insecurity: Not on file  Transportation Needs: Not on file  Physical Activity: Not on file  Stress: Not on file  Social Connections: Not on file  Intimate Partner Violence: Not on file    Review of Systems  Musculoskeletal:  Positive for back pain.  All other systems reviewed and are negative.       Objective    BP (!) 159/99   Pulse 75   Temp 97.7 F (36.5 C) (Oral)   Resp 16   Wt (!) 300 lb 3.2 oz (136.2 kg)   LMP 05/13/2020   SpO2 94%   BMI 53.18 kg/m   Physical Exam Vitals and nursing note reviewed.  Constitutional:      General: She is not in acute distress. Cardiovascular:     Rate and Rhythm: Normal rate and regular rhythm.  Pulmonary:     Effort:  Pulmonary effort is normal.     Breath sounds: Normal breath sounds.  Abdominal:     Palpations: Abdomen is soft.     Tenderness: There is no abdominal tenderness.  Musculoskeletal:     Lumbar back: Tenderness present. No deformity.  Neurological:     General: No focal deficit present.     Mental Status: She is alert and oriented to person, place, and time.         Assessment & Plan:   1. Essential hypertension Elevated reading. Discussed compliance. Meds refilled. Continue and monitor  2. Chronic right-sided low back pain with right-sided sciatica Neurontin '300mg'$  prescribed.   3. Class 3 severe obesity due to excess calories with serious comorbidity and body mass index (BMI) of 50.0 to 59.9 in adult [facility]) Discussed weigh dietary and activity options. Goal is 3-5lbs/mo weight loss    Return in about 4 weeks (around 07/22/2022) for  follow up.   Becky Sax, MD

## 2022-06-29 ENCOUNTER — Other Ambulatory Visit: Payer: Self-pay | Admitting: Critical Care Medicine

## 2022-06-29 ENCOUNTER — Other Ambulatory Visit: Payer: Self-pay

## 2022-06-29 MED ORDER — CARVEDILOL 12.5 MG PO TABS
12.5000 mg | ORAL_TABLET | Freq: Two times a day (BID) | ORAL | 3 refills | Status: DC
  Filled 2022-06-29: qty 60, 30d supply, fill #0
  Filled 2022-08-03: qty 60, 30d supply, fill #1
  Filled 2022-09-07: qty 60, 30d supply, fill #2
  Filled 2022-10-10: qty 60, 30d supply, fill #3

## 2022-07-01 ENCOUNTER — Other Ambulatory Visit: Payer: Self-pay

## 2022-07-22 IMAGING — MG MM DIGITAL SCREENING BILAT W/ TOMO AND CAD
6 of 12 series · 6 of 36 positions shown · non-contrast
Comparison: Previous exam(s).

CLINICAL DATA: Screening.

EXAM:
DIGITAL SCREENING BILATERAL MAMMOGRAM WITH TOMOSYNTHESIS AND CAD
TECHNIQUE: Bilateral screening digital craniocaudal and mediolateral oblique
mammograms were obtained. Bilateral screening digital breast
tomosynthesis was performed. The images were evaluated with
computer-aided detection.

[R CC synth-2D]
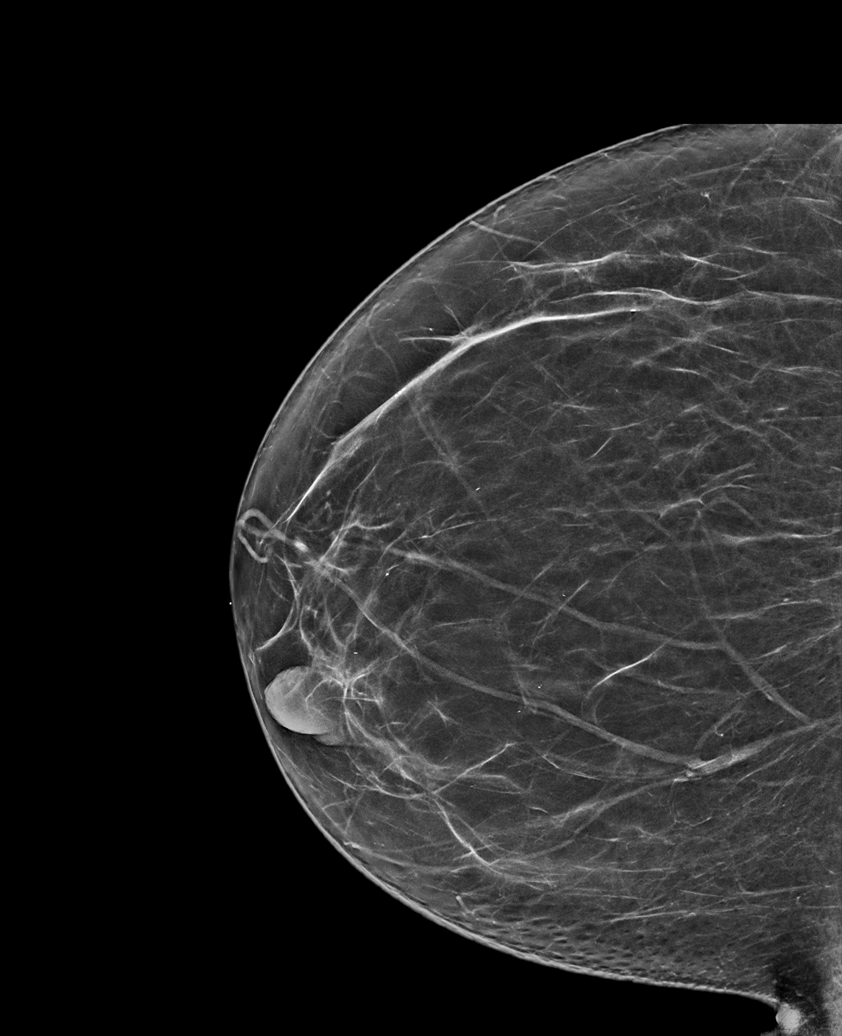

[L MLO synth-2D (1 of 2)]
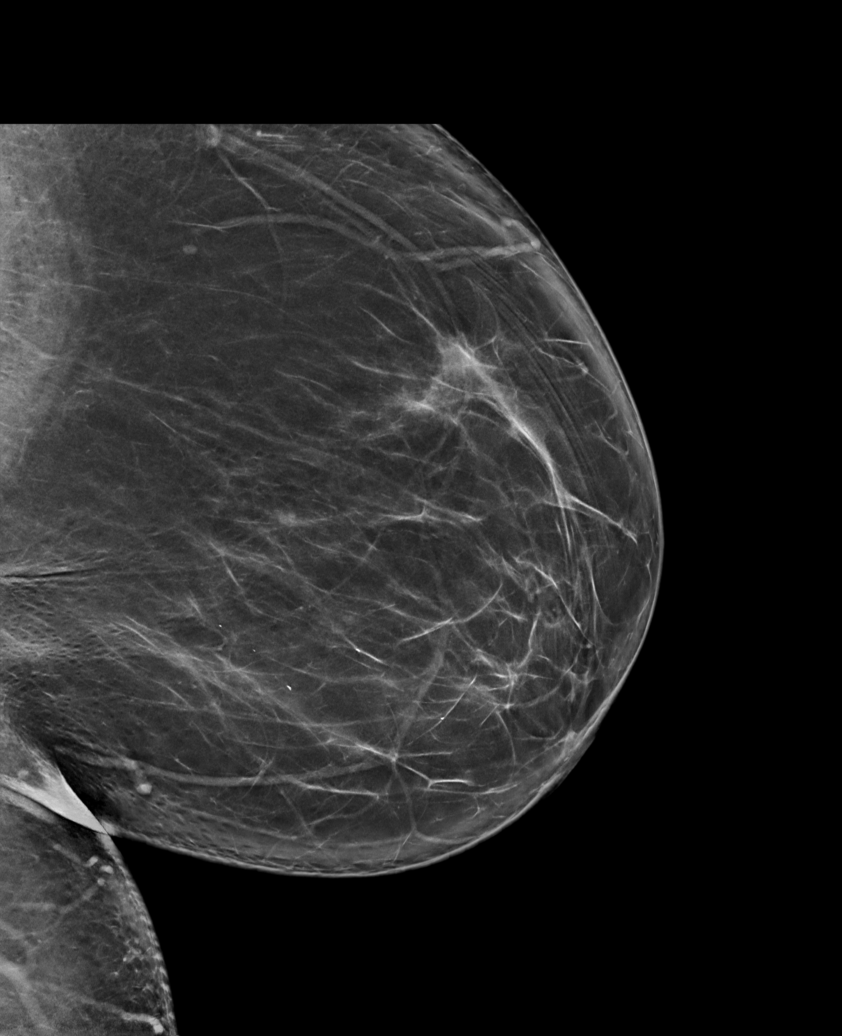

[R MLO synth-2D (1 of 2)]
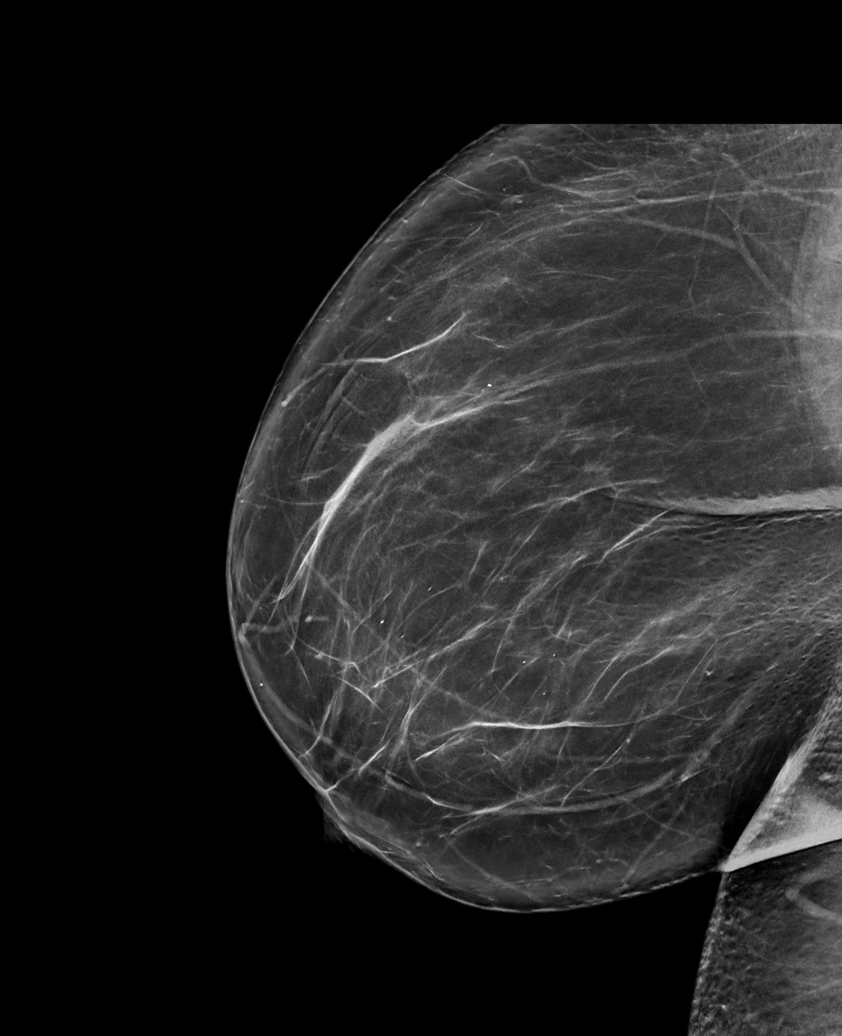

[R MLO synth-2D (2 of 2)]
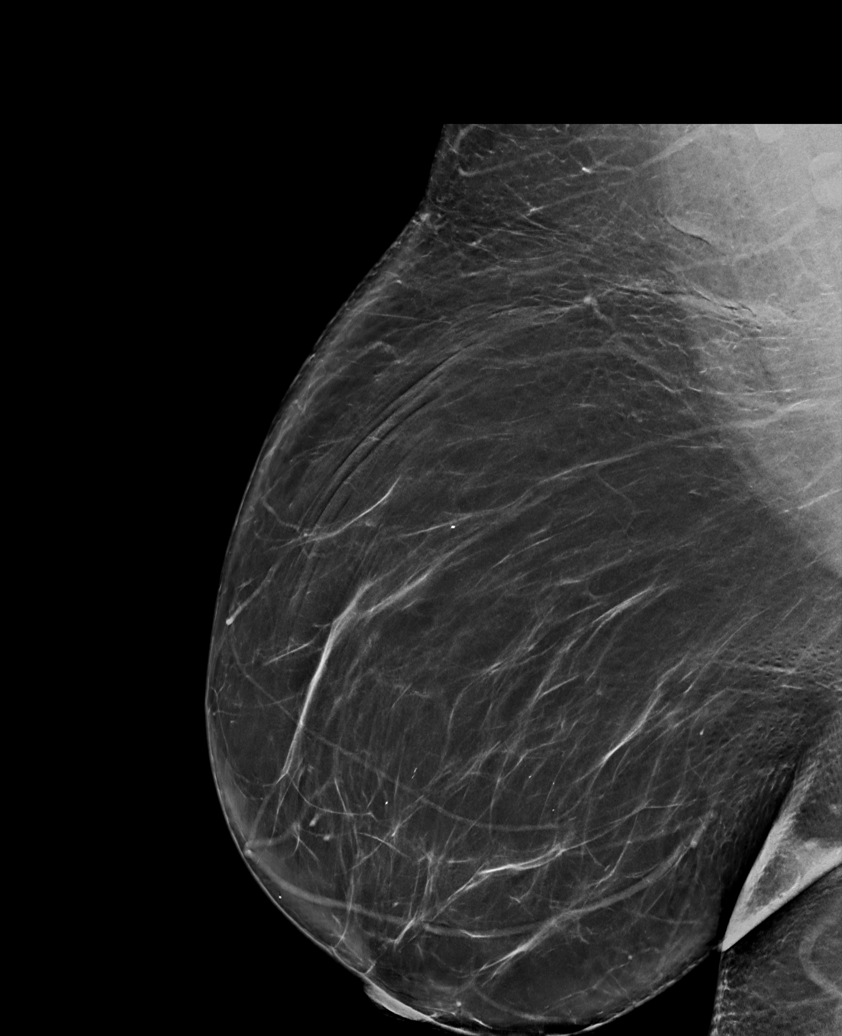

[L MLO synth-2D (2 of 2)]
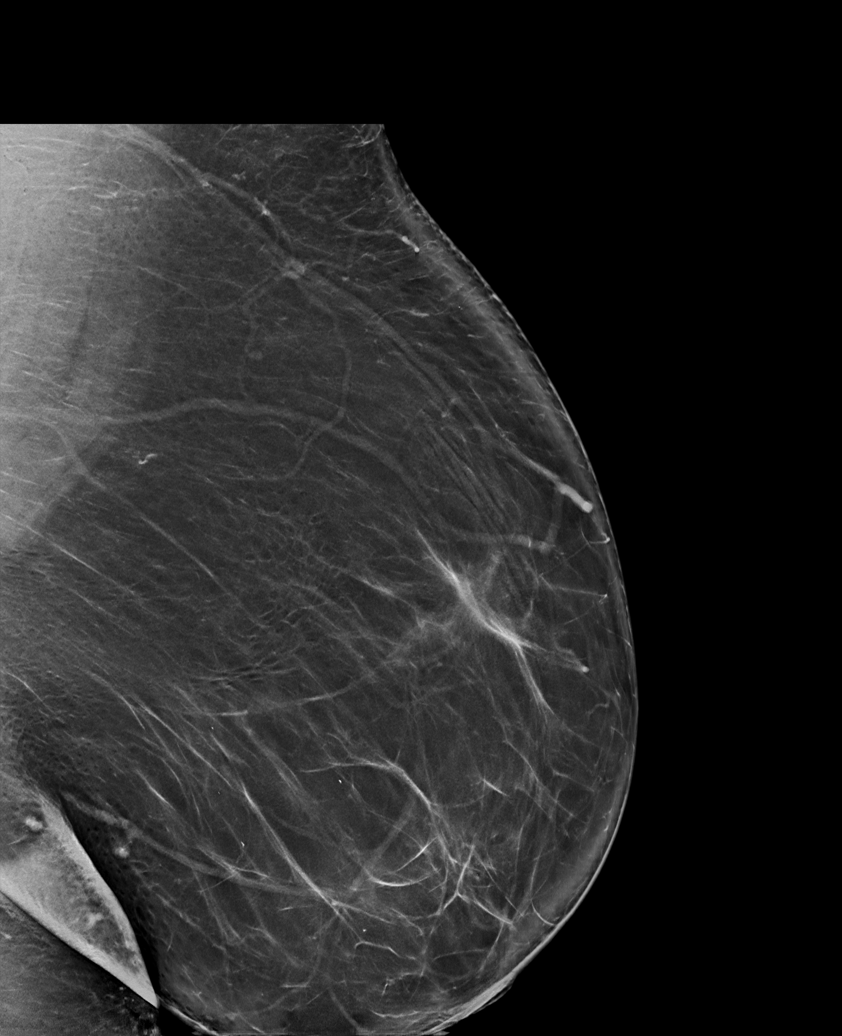

[L CC synth-2D]
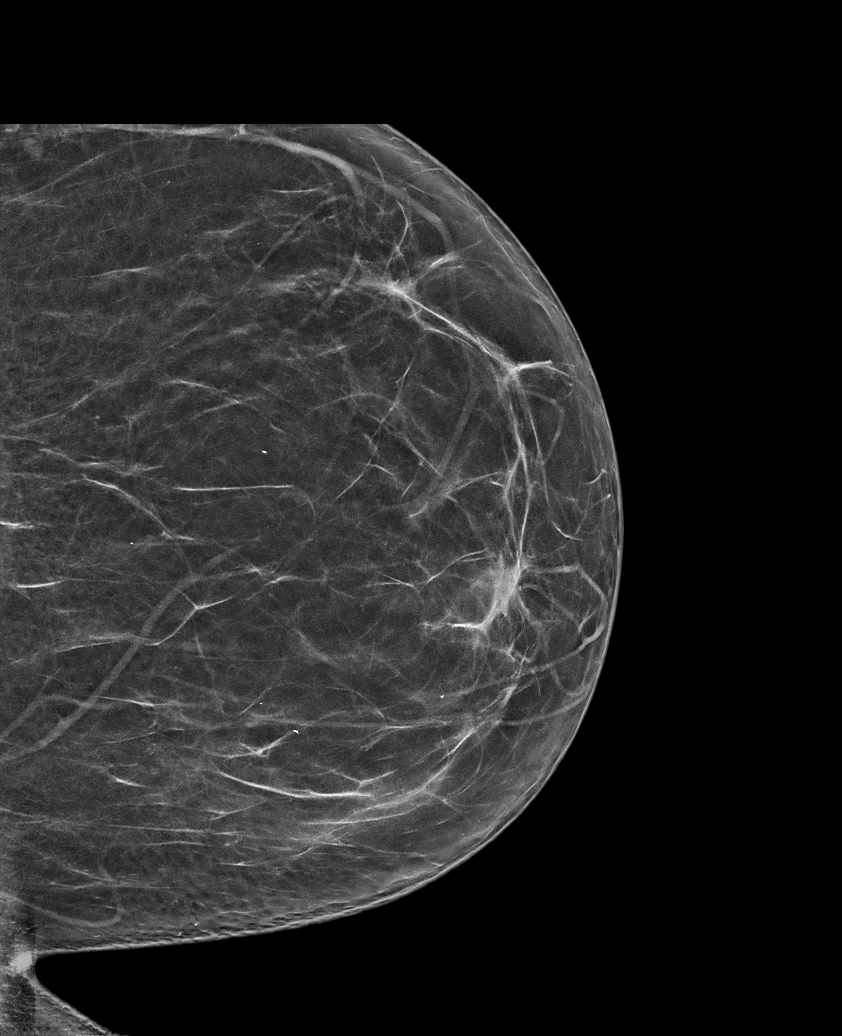

[6 of 36 positions shown; findings below may reference images not displayed]

ACR Breast Density Category b: There are scattered areas of
fibroglandular density.
FINDINGS: There are no findings suspicious for malignancy.
IMPRESSION: No mammographic evidence of malignancy. A result letter of this
screening mammogram will be mailed directly to the patient.

RECOMMENDATION:
Screening mammogram in one year. (Code:51-O-LD2)

BI-RADS CATEGORY  1: Negative.

## 2022-08-02 ENCOUNTER — Other Ambulatory Visit: Payer: Self-pay

## 2022-08-02 ENCOUNTER — Ambulatory Visit (INDEPENDENT_AMBULATORY_CARE_PROVIDER_SITE_OTHER): Payer: BC Managed Care – PPO | Admitting: Family Medicine

## 2022-08-02 ENCOUNTER — Encounter: Payer: Self-pay | Admitting: Family Medicine

## 2022-08-02 VITALS — BP 134/89 | HR 61 | Temp 98.1°F | Resp 16 | Ht 63.0 in | Wt 302.2 lb

## 2022-08-02 DIAGNOSIS — Z13228 Encounter for screening for other metabolic disorders: Secondary | ICD-10-CM

## 2022-08-02 DIAGNOSIS — E6609 Other obesity due to excess calories: Secondary | ICD-10-CM

## 2022-08-02 DIAGNOSIS — Z130 Encounter for screening for diseases of the blood and blood-forming organs and certain disorders involving the immune mechanism: Secondary | ICD-10-CM | POA: Diagnosis not present

## 2022-08-02 DIAGNOSIS — Z0001 Encounter for general adult medical examination with abnormal findings: Secondary | ICD-10-CM | POA: Diagnosis not present

## 2022-08-02 DIAGNOSIS — Z1329 Encounter for screening for other suspected endocrine disorder: Secondary | ICD-10-CM

## 2022-08-02 DIAGNOSIS — Z6841 Body Mass Index (BMI) 40.0 and over, adult: Secondary | ICD-10-CM

## 2022-08-02 DIAGNOSIS — Z13220 Encounter for screening for lipoid disorders: Secondary | ICD-10-CM | POA: Diagnosis not present

## 2022-08-02 DIAGNOSIS — Z23 Encounter for immunization: Secondary | ICD-10-CM

## 2022-08-02 DIAGNOSIS — Z1211 Encounter for screening for malignant neoplasm of colon: Secondary | ICD-10-CM

## 2022-08-02 DIAGNOSIS — Z0000 Encounter for general adult medical examination without abnormal findings: Secondary | ICD-10-CM

## 2022-08-02 MED ORDER — VALSARTAN 160 MG PO TABS
160.0000 mg | ORAL_TABLET | Freq: Every day | ORAL | 1 refills | Status: DC
  Filled 2022-08-02: qty 90, 90d supply, fill #0
  Filled 2022-11-07: qty 90, 90d supply, fill #1

## 2022-08-02 NOTE — Progress Notes (Signed)
Established Patient Office Visit  Subjective    Patient ID: Carolyn Mcdonald, female    DOB: 20-Nov-1965  Age: 56 y.o. MRN: [id number]  CC:  Chief Complaint  Patient presents with   Annual Exam    HPI Carolyn Mcdonald presents for routine annual exam. Patient denies acute complaints or concerns.    Outpatient Encounter Medications as of 08/02/2022  Medication Sig   acetaminophen (TYLENOL) 650 MG CR tablet Take 650 mg by mouth every 8 (eight) hours as needed for pain.   carvedilol (COREG) 12.5 MG tablet Take 1 tablet (12.5 mg total) by mouth 2 (two) times daily with a meal.   gabapentin (NEURONTIN) 300 MG capsule Take 1 capsule (300 mg total) by mouth 3 (three) times daily.   Multiple Vitamin (MULTIVITAMIN) tablet Take 1 tablet by mouth daily.   rivaroxaban (XARELTO) 20 MG TABS tablet TAKE 1 TABLET (20 MG TOTAL) BY MOUTH DAILY WITH SUPPER.   [DISCONTINUED] valsartan (DIOVAN) 160 MG tablet Take 1 tablet (160 mg total) by mouth daily.   valsartan (DIOVAN) 160 MG tablet Take 1 tablet (160 mg total) by mouth daily.   No facility-administered encounter medications on file as of 08/02/2022.    Past Medical History:  Diagnosis Date   DVT (deep venous thrombosis) (HCC)    Fibroid    Hypertension     Past Surgical History:  Procedure Laterality Date   ANKLE SURGERY     R ankle    CESAREAN SECTION     x3   HYSTERECTOMY ABDOMINAL WITH SALPINGECTOMY Bilateral 07/21/2020   Procedure: SUPRACERVICAL HYSTERECTOMY  WITH BILATERAL PARTIAL SALPINGECTOMY;  Surgeon: Mora Bellman, MD;  Location: [place];  Service: Gynecology;  Laterality: Bilateral;  tap block   SUPRACERVICAL ABDOMINAL HYSTERECTOMY  07/21/2020   TOE SURGERY     R Great toe    TUBAL LIGATION  1993    No family history on file.  Social History   Socioeconomic History   Marital status: Married    Spouse name: Not on file   Number of children: Not on file   Years of education: Not on file   Highest education level: Not  on file  Occupational History   Not on file  Tobacco Use   Smoking status: Never   Smokeless tobacco: Never  Vaping Use   Vaping Use: Never used  Substance and Sexual Activity   Alcohol use: Never   Drug use: Never   Sexual activity: Not Currently    Birth control/protection: Surgical  Other Topics Concern   Not on file  Social History Narrative   Not on file   Social Determinants of Health   Financial Resource Strain: Not on file  Food Insecurity: Not on file  Transportation Needs: Not on file  Physical Activity: Not on file  Stress: Not on file  Social Connections: Not on file  Intimate Partner Violence: Not on file    Review of Systems  All other systems reviewed and are negative.       Objective    BP 134/89   Pulse 61   Temp 98.1 F (36.7 C) (Oral)   Resp 16   Ht _0  (1.6 m)   Wt (!) 302 lb 3.2 oz (137.1 kg)   LMP 05/13/2020   SpO2 99%   BMI 53.53 kg/m   Physical Exam Vitals and nursing note reviewed.  Constitutional:      General: She is not in acute distress.    Appearance: She is  obese.  HENT:     Head: Normocephalic and atraumatic.     Right Ear: Tympanic membrane, ear canal and external ear normal.     Left Ear: Tympanic membrane, ear canal and external ear normal.     Nose: Nose normal.     Mouth/Throat:     Mouth: Mucous membranes are moist.     Pharynx: Oropharynx is clear.  Eyes:     Conjunctiva/sclera: Conjunctivae normal.     Pupils: Pupils are equal, round, and reactive to light.  Neck:     Thyroid: No thyromegaly.  Cardiovascular:     Rate and Rhythm: Normal rate and regular rhythm.     Heart sounds: Normal heart sounds. No murmur heard. Pulmonary:     Effort: Pulmonary effort is normal. No respiratory distress.     Breath sounds: Normal breath sounds.  Abdominal:     General: There is no distension.     Palpations: Abdomen is soft. There is no mass.     Tenderness: There is no abdominal tenderness.  Musculoskeletal:         General: Normal range of motion.     Cervical back: Normal range of motion and neck supple.  Skin:    General: Skin is warm and dry.  Neurological:     General: No focal deficit present.     Mental Status: She is alert and oriented to person, place, and time.  Psychiatric:        Mood and Affect: Mood normal.        Behavior: Behavior normal.         Assessment & Plan:   1. Annual physical exam  - CMP14+EGFR  2. Screening for lipid disorders  - Lipid Panel  3. Screening for deficiency anemia  - CBC with Differential  4. Screening for endocrine/metabolic/immunity disorders  - Hemoglobin A1c - TSH - Vitamin D, 25-hydroxy  5. Screening for colon cancer  - Cologuard  6. Need for immunization against influenza  - Flu Vaccine QUAD 76moIM (Fluarix, Fluzone & Alfiuria Quad PF)  7. Need for shingles vaccine  - Varicella-zoster vaccine IM    Return in about 6 months (around 01/31/2023) for follow up.   WBecky Sax MD

## 2022-08-03 LAB — LIPID PANEL
Chol/HDL Ratio: 3.1 ratio (ref 0.0–4.4)
Cholesterol, Total: 216 mg/dL — ABNORMAL HIGH (ref 100–199)
HDL: 69 mg/dL (ref >39)
LDL Chol Calc (NIH): 139 mg/dL — ABNORMAL HIGH (ref 0–99)
Triglycerides: 45 mg/dL (ref 0–149)
VLDL Cholesterol Cal: 8 mg/dL (ref 5–40)

## 2022-08-03 LAB — HEMOGLOBIN A1C
Est. average glucose Bld gHb Est-mCnc: 120 mg/dL
Hgb A1c MFr Bld: 5.8 % — ABNORMAL HIGH (ref 4.8–5.6)

## 2022-08-03 LAB — CMP14+EGFR
ALT: 14 IU/L (ref 0–32)
AST: 16 IU/L (ref 0–40)
Albumin/Globulin Ratio: 1.2 (ref 1.2–2.2)
Albumin: 4.1 g/dL (ref 3.8–4.9)
Alkaline Phosphatase: 76 IU/L (ref 44–121)
BUN/Creatinine Ratio: 14 (ref 9–23)
BUN: 11 mg/dL (ref 6–24)
Bilirubin Total: 0.8 mg/dL (ref 0.0–1.2)
CO2: 23 mmol/L (ref 20–29)
Calcium: 9.5 mg/dL (ref 8.7–10.2)
Chloride: 101 mmol/L (ref 96–106)
Creatinine, Ser: 0.76 mg/dL (ref 0.57–1.00)
Globulin, Total: 3.3 g/dL (ref 1.5–4.5)
Glucose: 77 mg/dL (ref 70–99)
Potassium: 4 mmol/L (ref 3.5–5.2)
Sodium: 141 mmol/L (ref 134–144)
Total Protein: 7.4 g/dL (ref 6.0–8.5)
eGFR: 91 mL/min/{1.73_m2} (ref >59)

## 2022-08-03 LAB — CBC WITH DIFFERENTIAL/PLATELET
Basophils Absolute: 0 10*3/uL (ref 0.0–0.2)
Basos: 1 %
EOS (ABSOLUTE): 0.2 10*3/uL (ref 0.0–0.4)
Eos: 4 %
Hematocrit: 37.9 % (ref 34.0–46.6)
Hemoglobin: 12.7 g/dL (ref 11.1–15.9)
Immature Grans (Abs): 0 10*3/uL (ref 0.0–0.1)
Immature Granulocytes: 0 %
Lymphocytes Absolute: 1.6 10*3/uL (ref 0.7–3.1)
Lymphs: 38 %
MCH: 30.6 pg (ref 26.6–33.0)
MCHC: 33.5 g/dL (ref 31.5–35.7)
MCV: 91 fL (ref 79–97)
Monocytes Absolute: 0.5 10*3/uL (ref 0.1–0.9)
Monocytes: 12 %
Neutrophils Absolute: 1.9 10*3/uL (ref 1.4–7.0)
Neutrophils: 45 %
Platelets: 231 10*3/uL (ref 150–450)
RBC: 4.15 x10E6/uL (ref 3.77–5.28)
RDW: 13 % (ref 11.7–15.4)
WBC: 4.3 10*3/uL (ref 3.4–10.8)

## 2022-08-03 LAB — TSH: TSH: 1.53 u[IU]/mL (ref 0.450–4.500)

## 2022-08-03 LAB — VITAMIN D 25 HYDROXY (VIT D DEFICIENCY, FRACTURES): Vit D, 25-Hydroxy: 14.1 ng/mL — ABNORMAL LOW (ref 30.0–100.0)

## 2022-08-05 ENCOUNTER — Other Ambulatory Visit: Payer: Self-pay

## 2022-08-08 ENCOUNTER — Other Ambulatory Visit: Payer: Self-pay

## 2022-08-08 ENCOUNTER — Other Ambulatory Visit (HOSPITAL_BASED_OUTPATIENT_CLINIC_OR_DEPARTMENT_OTHER): Payer: Self-pay

## 2022-08-08 ENCOUNTER — Other Ambulatory Visit: Payer: Self-pay | Admitting: Family Medicine

## 2022-08-08 MED ORDER — VITAMIN D (ERGOCALCIFEROL) 1.25 MG (50000 UNIT) PO CAPS
50000.0000 [IU] | ORAL_CAPSULE | ORAL | 0 refills | Status: DC
  Filled 2022-08-08 – 2023-01-19 (×2): qty 12, 84d supply, fill #0

## 2022-08-15 ENCOUNTER — Other Ambulatory Visit: Payer: Self-pay

## 2022-09-07 ENCOUNTER — Other Ambulatory Visit: Payer: Self-pay | Admitting: Family Medicine

## 2022-09-07 ENCOUNTER — Other Ambulatory Visit: Payer: Self-pay

## 2022-09-07 DIAGNOSIS — Z1231 Encounter for screening mammogram for malignant neoplasm of breast: Secondary | ICD-10-CM

## 2022-09-08 ENCOUNTER — Ambulatory Visit
Admission: RE | Admit: 2022-09-08 | Discharge: 2022-09-08 | Disposition: A | Discharge status: Home / Self Care | Payer: BC Managed Care – PPO | Source: Ambulatory Visit | Attending: Family Medicine | Admitting: Family Medicine

## 2022-09-08 DIAGNOSIS — Z1231 Encounter for screening mammogram for malignant neoplasm of breast: Secondary | ICD-10-CM

## 2022-09-13 ENCOUNTER — Encounter: Payer: Self-pay | Admitting: Family Medicine

## 2022-09-28 ENCOUNTER — Other Ambulatory Visit: Payer: Self-pay

## 2022-10-07 ENCOUNTER — Other Ambulatory Visit: Payer: Self-pay

## 2022-10-10 ENCOUNTER — Other Ambulatory Visit: Payer: Self-pay

## 2022-10-11 ENCOUNTER — Other Ambulatory Visit: Payer: Self-pay

## 2022-11-07 ENCOUNTER — Other Ambulatory Visit: Payer: Self-pay

## 2022-11-10 ENCOUNTER — Other Ambulatory Visit: Payer: Self-pay

## 2022-11-15 ENCOUNTER — Other Ambulatory Visit: Payer: Self-pay | Admitting: Family Medicine

## 2022-11-15 ENCOUNTER — Other Ambulatory Visit: Payer: Self-pay

## 2022-11-15 DIAGNOSIS — Z86718 Personal history of other venous thrombosis and embolism: Secondary | ICD-10-CM

## 2022-11-17 ENCOUNTER — Other Ambulatory Visit: Payer: Self-pay

## 2022-11-17 ENCOUNTER — Other Ambulatory Visit: Payer: Self-pay | Admitting: Family Medicine

## 2022-11-17 DIAGNOSIS — Z86718 Personal history of other venous thrombosis and embolism: Secondary | ICD-10-CM

## 2022-11-17 MED ORDER — CARVEDILOL 12.5 MG PO TABS
12.5000 mg | ORAL_TABLET | Freq: Two times a day (BID) | ORAL | 3 refills | Status: DC
  Filled 2022-11-17: qty 60, 30d supply, fill #0
  Filled 2022-12-19: qty 60, 30d supply, fill #1
  Filled 2023-01-19: qty 60, 30d supply, fill #2
  Filled 2023-02-27: qty 60, 30d supply, fill #3

## 2022-11-17 MED ORDER — RIVAROXABAN 20 MG PO TABS
20.0000 mg | ORAL_TABLET | Freq: Every day | ORAL | 1 refills | Status: DC
  Filled 2022-11-17: qty 90, 90d supply, fill #0
  Filled 2023-02-09: qty 90, 90d supply, fill #1

## 2022-11-18 ENCOUNTER — Other Ambulatory Visit: Payer: Self-pay

## 2022-12-19 ENCOUNTER — Other Ambulatory Visit: Payer: Self-pay

## 2022-12-20 ENCOUNTER — Other Ambulatory Visit: Payer: Self-pay

## 2022-12-22 ENCOUNTER — Ambulatory Visit: Payer: BC Managed Care – PPO | Admitting: Family Medicine

## 2022-12-22 DIAGNOSIS — Z23 Encounter for immunization: Secondary | ICD-10-CM

## 2022-12-22 NOTE — Progress Notes (Signed)
After obtaining informed consent, the immunization is given by Amory Zbikowski E Jaivon Vanbeek .  

## 2023-01-18 ENCOUNTER — Ambulatory Visit: Payer: Self-pay | Admitting: *Deleted

## 2023-01-18 NOTE — Telephone Encounter (Signed)
  Chief Complaint: Rash Symptoms: Severe itching, rash "Bumps, no color" both shoulders. States started on right arm S/P shingles vaccine 2 weeks ago. Has spread to both shoulders Frequency: 2 weeks, worsening Pertinent Negatives: Patient denies ...no new meds or cosmetics, detergent. No fever, SOB Disposition: [] ED /[] Urgent Care (no appt availability in office) / [x] Appointment(In office/virtual)/ []  [facility]/ [] Home Care/ [] Refused Recommended Disposition /[] [facility]/ []  Follow-up with PCP Additional Notes: Appt secured for tomorrow. Care advise provided, pt verbalizes understanding.  Reason for Disposition  [1] MODERATE-SEVERE local itching (i.e., interferes with work, school, activities) AND [2] not improved after 24 hours of hydrocortisone cream  Answer Assessment - Initial Assessment Questions 1. DESCRIPTION: "Describe the itching you are having." "Where is it located?"     Both shoulders 2. SEVERITY: "How bad is it?"    - MILD: Doesn't interfere with normal activities.   - MODERATE-SEVERE: Interferes with work, school, sleep, or other activities.      severe 3. SCRATCHING: "Are there any scratch marks? Bleeding?"     No 4. ONSET: "When did the itching begin?"      2 weeks ago 5. CAUSE: "What do you think is causing the itching?"      Unsure 6. OTHER SYMPTOMS: "Do you have any other symptoms?"       None  Protocols used: Itching - Localized-A-AH

## 2023-01-19 ENCOUNTER — Ambulatory Visit (INDEPENDENT_AMBULATORY_CARE_PROVIDER_SITE_OTHER): Payer: BC Managed Care – PPO | Admitting: Family Medicine

## 2023-01-19 ENCOUNTER — Other Ambulatory Visit: Payer: Self-pay

## 2023-01-19 VITALS — BP 165/98 | HR 62 | Temp 98.1°F | Resp 16 | Wt 308.6 lb

## 2023-01-19 DIAGNOSIS — Z6841 Body Mass Index (BMI) 40.0 and over, adult: Secondary | ICD-10-CM

## 2023-01-19 DIAGNOSIS — L309 Dermatitis, unspecified: Secondary | ICD-10-CM

## 2023-01-19 DIAGNOSIS — I10 Essential (primary) hypertension: Secondary | ICD-10-CM

## 2023-01-19 MED ORDER — SPIRONOLACTONE 25 MG PO TABS
25.0000 mg | ORAL_TABLET | Freq: Every day | ORAL | 0 refills | Status: DC
  Filled 2023-01-19: qty 90, 90d supply, fill #0

## 2023-01-19 MED ORDER — TRIAMCINOLONE ACETONIDE 0.1 % EX CREA
1.0000 | TOPICAL_CREAM | Freq: Two times a day (BID) | CUTANEOUS | 0 refills | Status: DC
  Filled 2023-01-19: qty 45, 30d supply, fill #0

## 2023-01-19 NOTE — Progress Notes (Signed)
Established Patient Office Visit  Subjective    Patient ID: Carolyn Mcdonald, female    DOB: [birth year]  Age: 57 y.o. MRN: [id number]  CC:  Chief Complaint  Patient presents with   Rash    HPI Carolyn Mcdonald presents for routine follow up of chronic med issues. Patient denies acute complaints.   Outpatient Encounter Medications as of 01/19/2023  Medication Sig   spironolactone (ALDACTONE) 25 MG tablet Take 1 tablet (25 mg total) by mouth daily.   triamcinolone cream (KENALOG) 0.1 % Apply 1 Application topically 2 (two) times daily.   Vitamin D, Ergocalciferol, (DRISDOL) 1.25 MG (50000 UNIT) CAPS capsule Take 1 capsule (50,000 Units total) by mouth every 7 (seven) days.   acetaminophen (TYLENOL) 650 MG CR tablet Take 650 mg by mouth every 8 (eight) hours as needed for pain.   carvedilol (COREG) 12.5 MG tablet Take 1 tablet (12.5 mg total) by mouth 2 (two) times daily with a meal.   gabapentin (NEURONTIN) 300 MG capsule Take 1 capsule (300 mg total) by mouth 3 (three) times daily.   Multiple Vitamin (MULTIVITAMIN) tablet Take 1 tablet by mouth daily.   rivaroxaban (XARELTO) 20 MG TABS tablet TAKE 1 TABLET (20 MG TOTAL) BY MOUTH DAILY WITH SUPPER.   valsartan (DIOVAN) 160 MG tablet Take 1 tablet (160 mg total) by mouth daily.   No facility-administered encounter medications on file as of 01/19/2023.    Past Medical History:  Diagnosis Date   DVT (deep venous thrombosis) (HCC)    Fibroid    Hypertension     Past Surgical History:  Procedure Laterality Date   ANKLE SURGERY     R ankle    CESAREAN SECTION     x3   HYSTERECTOMY ABDOMINAL WITH SALPINGECTOMY Bilateral 07/21/2020   Procedure: SUPRACERVICAL HYSTERECTOMY  WITH BILATERAL PARTIAL SALPINGECTOMY;  Surgeon: Catalina Antigua, MD;  Location: [facility];  Service: Gynecology;  Laterality: Bilateral;  tap block   SUPRACERVICAL ABDOMINAL HYSTERECTOMY  07/21/2020   TOE SURGERY     R Great toe    TUBAL LIGATION  1993    No family  history on file.  Social History   Socioeconomic History   Marital status: Married    Spouse name: Not on file   Number of children: Not on file   Years of education: Not on file   Highest education level: Not on file  Occupational History   Not on file  Tobacco Use   Smoking status: Never   Smokeless tobacco: Never  Vaping Use   Vaping Use: Never used  Substance and Sexual Activity   Alcohol use: Never   Drug use: Never   Sexual activity: Not Currently    Birth control/protection: Surgical  Other Topics Concern   Not on file  Social History Narrative   Not on file   Social Determinants of Health   Financial Resource Strain: Not on file  Food Insecurity: Not on file  Transportation Needs: Not on file  Physical Activity: Not on file  Stress: Not on file  Social Connections: Not on file  Intimate Partner Violence: Not on file    Review of Systems  Skin:  Positive for rash.  All other systems reviewed and are negative.       Objective    BP (!) 165/98   Pulse 62   Temp 98.1 F (36.7 C) (Oral)   Resp 16   Wt (!) 308 lb 9.6 oz (140 kg)   LMP 05/13/2020  SpO2 98%   BMI 54.67 kg/m   Physical Exam Vitals and nursing note reviewed.  Constitutional:      General: She is not in acute distress.    Appearance: She is obese.  Cardiovascular:     Rate and Rhythm: Normal rate and regular rhythm.  Pulmonary:     Effort: Pulmonary effort is normal.     Breath sounds: Normal breath sounds.  Abdominal:     Palpations: Abdomen is soft.     Tenderness: There is no abdominal tenderness.  Skin:    Findings: Rash present.  Neurological:     General: No focal deficit present.     Mental Status: She is alert and oriented to person, place, and time.         Assessment & Plan:   1. Essential hypertension Elevated readings. Spironolactone 25 mg daily added to regimen.   2. Class 3 severe obesity due to excess calories with serious comorbidity and body mass  index (BMI) of 50.0 to 59.9 in adult [facility]) Discussed dietary and activity options.   3. Dermatitis Triamcinolone cream prescribed  Return in about 2 weeks (around 02/02/2023) for follow up.   Tommie Raymond, MD

## 2023-01-20 ENCOUNTER — Other Ambulatory Visit: Payer: Self-pay

## 2023-01-25 ENCOUNTER — Encounter: Payer: Self-pay | Admitting: Family Medicine

## 2023-02-02 ENCOUNTER — Ambulatory Visit (INDEPENDENT_AMBULATORY_CARE_PROVIDER_SITE_OTHER): Payer: BC Managed Care – PPO | Admitting: Family Medicine

## 2023-02-02 VITALS — BP 143/93 | HR 67 | Temp 98.1°F | Resp 16 | Wt 305.0 lb

## 2023-02-02 DIAGNOSIS — R7303 Prediabetes: Secondary | ICD-10-CM

## 2023-02-02 DIAGNOSIS — I10 Essential (primary) hypertension: Secondary | ICD-10-CM | POA: Diagnosis not present

## 2023-02-02 DIAGNOSIS — Z6841 Body Mass Index (BMI) 40.0 and over, adult: Secondary | ICD-10-CM

## 2023-02-02 NOTE — Progress Notes (Signed)
Patient is here for 2wk follow-up BP Patent has uncontrolled hypertension Provider is  aware of readings  

## 2023-02-07 ENCOUNTER — Encounter: Payer: Self-pay | Admitting: Family Medicine

## 2023-02-07 NOTE — Progress Notes (Signed)
Established Patient Office Visit  Subjective    Patient ID: Carolyn Mcdonald, female    DOB: 1965-11-07  Age: 57 y.o. MRN: [id number]  CC:  Chief Complaint  Patient presents with   Follow-up   Hypertension    HPI Carolyn Mcdonald presents for follow up of hypertension. Patient denies acute complaints.    Outpatient Encounter Medications as of 02/02/2023  Medication Sig   acetaminophen (TYLENOL) 650 MG CR tablet Take 650 mg by mouth every 8 (eight) hours as needed for pain.   carvedilol (COREG) 12.5 MG tablet Take 1 tablet (12.5 mg total) by mouth 2 (two) times daily with a meal.   gabapentin (NEURONTIN) 300 MG capsule Take 1 capsule (300 mg total) by mouth 3 (three) times daily.   Multiple Vitamin (MULTIVITAMIN) tablet Take 1 tablet by mouth daily.   rivaroxaban (XARELTO) 20 MG TABS tablet TAKE 1 TABLET (20 MG TOTAL) BY MOUTH DAILY WITH SUPPER.   spironolactone (ALDACTONE) 25 MG tablet Take 1 tablet (25 mg total) by mouth daily.   triamcinolone cream (KENALOG) 0.1 % Apply 1 Application topically 2 (two) times daily.   valsartan (DIOVAN) 160 MG tablet Take 1 tablet (160 mg total) by mouth daily.   Vitamin D, Ergocalciferol, (DRISDOL) 1.25 MG (50000 UNIT) CAPS capsule Take 1 capsule (50,000 Units total) by mouth every 7 (seven) days.   No facility-administered encounter medications on file as of 02/02/2023.    Past Medical History:  Diagnosis Date   DVT (deep venous thrombosis) (HCC)    Fibroid    Hypertension     Past Surgical History:  Procedure Laterality Date   ANKLE SURGERY     R ankle    CESAREAN SECTION     x3   HYSTERECTOMY ABDOMINAL WITH SALPINGECTOMY Bilateral 07/21/2020   Procedure: SUPRACERVICAL HYSTERECTOMY  WITH BILATERAL PARTIAL SALPINGECTOMY;  Surgeon: Catalina Antigua, MD;  Location: [facility];  Service: Gynecology;  Laterality: Bilateral;  tap block   SUPRACERVICAL ABDOMINAL HYSTERECTOMY  07/21/2020   TOE SURGERY     R Great toe    TUBAL LIGATION  1993     No family history on file.  Social History   Socioeconomic History   Marital status: Married    Spouse name: Not on file   Number of children: Not on file   Years of education: Not on file   Highest education level: Not on file  Occupational History   Not on file  Tobacco Use   Smoking status: Never   Smokeless tobacco: Never  Vaping Use   Vaping Use: Never used  Substance and Sexual Activity   Alcohol use: Never   Drug use: Never   Sexual activity: Not Currently    Birth control/protection: Surgical  Other Topics Concern   Not on file  Social History Narrative   Not on file   Social Determinants of Health   Financial Resource Strain: Low Risk  (02/02/2023)   Overall Financial Resource Strain (CARDIA)    Difficulty of Paying Living Expenses: Not very hard  Food Insecurity: No Food Insecurity (02/02/2023)   Hunger Vital Sign    Worried About Running Out of Food in the Last Year: Never true    Ran Out of Food in the Last Year: Never true  Transportation Needs: No Transportation Needs (02/02/2023)   PRAPARE - Administrator, Civil Service (Medical): No    Lack of Transportation (Non-Medical): No  Physical Activity: Inactive (02/02/2023)   Exercise Vital Sign  Days of Exercise per Week: 0 days    Minutes of Exercise per Session: 0 min  Stress: No Stress Concern Present (02/02/2023)   [employer] of Occupational Health - Occupational Stress Questionnaire    Feeling of Stress : Not at all  Social Connections: Socially Integrated (02/02/2023)   Social Connection and Isolation Panel [NHANES]    Frequency of Communication with Friends and Family: More than three times a week    Frequency of Social Gatherings with Friends and Family: Three times a week    Attends Religious Services: More than 4 times per year    Active Member of Clubs or Organizations: Yes    Attends Banker Meetings: Never    Marital Status: Married  Catering manager  Violence: Not At Risk (02/02/2023)   Humiliation, Afraid, Rape, and Kick questionnaire    Fear of Current or Ex-Partner: No    Emotionally Abused: No    Physically Abused: No    Sexually Abused: No    Review of Systems  All other systems reviewed and are negative.       Objective    BP (!) 143/93   Pulse 67   Temp 98.1 F (36.7 C) (Oral)   Resp 16   Wt (!) 305 lb (138.3 kg)   LMP 05/13/2020   SpO2 98%   BMI 54.03 kg/m   Physical Exam Vitals and nursing note reviewed.  Constitutional:      General: She is not in acute distress.    Appearance: She is obese.  Cardiovascular:     Rate and Rhythm: Normal rate and regular rhythm.  Pulmonary:     Effort: Pulmonary effort is normal.     Breath sounds: Normal breath sounds.  Abdominal:     Palpations: Abdomen is soft.     Tenderness: There is no abdominal tenderness.  Neurological:     General: No focal deficit present.     Mental Status: She is alert and oriented to person, place, and time.         Assessment & Plan:   1. Essential hypertension Slightly elevated readings but improved with present management.   2. Class 3 severe obesity due to excess calories with serious comorbidity and body mass index (BMI) of 50.0 to 59.9 in adult [facility]) Discussed dietary and activity options.   3. Prediabetes As above    No follow-ups on file.   Tommie Raymond, MD

## 2023-02-09 ENCOUNTER — Other Ambulatory Visit: Payer: Self-pay | Admitting: Family Medicine

## 2023-02-09 ENCOUNTER — Other Ambulatory Visit: Payer: Self-pay

## 2023-02-09 MED ORDER — VALSARTAN 160 MG PO TABS
160.0000 mg | ORAL_TABLET | Freq: Every day | ORAL | 1 refills | Status: DC
  Filled 2023-02-09: qty 90, 90d supply, fill #0
  Filled 2023-06-21 – 2023-09-29 (×2): qty 90, 90d supply, fill #1

## 2023-03-14 ENCOUNTER — Encounter: Payer: Self-pay | Admitting: Family Medicine

## 2023-03-14 ENCOUNTER — Ambulatory Visit: Payer: BC Managed Care – PPO | Admitting: Family Medicine

## 2023-03-14 ENCOUNTER — Other Ambulatory Visit: Payer: Self-pay

## 2023-03-14 VITALS — BP 135/93 | HR 69 | Temp 98.1°F | Resp 16 | Wt 305.6 lb

## 2023-03-14 DIAGNOSIS — I10 Essential (primary) hypertension: Secondary | ICD-10-CM

## 2023-03-14 MED ORDER — SPIRONOLACTONE 50 MG PO TABS
50.0000 mg | ORAL_TABLET | Freq: Every day | ORAL | 0 refills | Status: DC
  Filled 2023-03-14: qty 90, 90d supply, fill #0

## 2023-03-14 NOTE — Progress Notes (Unsigned)
Patient is here for 2wk follow-up BP Patent has uncontrolled hypertension Provider is  aware of readings  

## 2023-03-15 ENCOUNTER — Encounter: Payer: Self-pay | Admitting: Family Medicine

## 2023-03-15 NOTE — Progress Notes (Signed)
Established Patient Office Visit  Subjective    Patient ID: Carolyn Mcdonald, female    DOB: 12-Oct-1965  Age: 57 y.o. MRN: [id number]  CC: No chief complaint on file.   HPI Carolyn Mcdonald presents for hypertension follow up.   Outpatient Encounter Medications as of 03/14/2023  Medication Sig   spironolactone (ALDACTONE) 50 MG tablet Take 1 tablet (50 mg total) by mouth daily.   acetaminophen (TYLENOL) 650 MG CR tablet Take 650 mg by mouth every 8 (eight) hours as needed for pain.   carvedilol (COREG) 12.5 MG tablet Take 1 tablet (12.5 mg total) by mouth 2 (two) times daily with a meal.   gabapentin (NEURONTIN) 300 MG capsule Take 1 capsule (300 mg total) by mouth 3 (three) times daily.   Multiple Vitamin (MULTIVITAMIN) tablet Take 1 tablet by mouth daily.   rivaroxaban (XARELTO) 20 MG TABS tablet TAKE 1 TABLET (20 MG TOTAL) BY MOUTH DAILY WITH SUPPER.   spironolactone (ALDACTONE) 25 MG tablet Take 1 tablet (25 mg total) by mouth daily.   triamcinolone cream (KENALOG) 0.1 % Apply 1 Application topically 2 (two) times daily.   valsartan (DIOVAN) 160 MG tablet Take 1 tablet (160 mg total) by mouth daily.   Vitamin D, Ergocalciferol, (DRISDOL) 1.25 MG (50000 UNIT) CAPS capsule Take 1 capsule (50,000 Units total) by mouth every 7 (seven) days.   No facility-administered encounter medications on file as of 03/14/2023.    Past Medical History:  Diagnosis Date   DVT (deep venous thrombosis) (HCC)    Fibroid    Hypertension     Past Surgical History:  Procedure Laterality Date   ANKLE SURGERY     R ankle    CESAREAN SECTION     x3   HYSTERECTOMY ABDOMINAL WITH SALPINGECTOMY Bilateral 07/21/2020   Procedure: SUPRACERVICAL HYSTERECTOMY  WITH BILATERAL PARTIAL SALPINGECTOMY;  Surgeon: Catalina Antigua, MD;  Location: [facility];  Service: Gynecology;  Laterality: Bilateral;  tap block   SUPRACERVICAL ABDOMINAL HYSTERECTOMY  07/21/2020   TOE SURGERY     R Great toe    TUBAL LIGATION  1993     No family history on file.  Social History   Socioeconomic History   Marital status: Married    Spouse name: Not on file   Number of children: Not on file   Years of education: Not on file   Highest education level: Not on file  Occupational History   Not on file  Tobacco Use   Smoking status: Never   Smokeless tobacco: Never  Vaping Use   Vaping Use: Never used  Substance and Sexual Activity   Alcohol use: Never   Drug use: Never   Sexual activity: Not Currently    Birth control/protection: Surgical  Other Topics Concern   Not on file  Social History Narrative   Not on file   Social Determinants of Health   Financial Resource Strain: Low Risk  (02/02/2023)   Overall Financial Resource Strain (CARDIA)    Difficulty of Paying Living Expenses: Not very hard  Food Insecurity: No Food Insecurity (02/02/2023)   Hunger Vital Sign    Worried About Running Out of Food in the Last Year: Never true    Ran Out of Food in the Last Year: Never true  Transportation Needs: No Transportation Needs (02/02/2023)   PRAPARE - Administrator, Civil Service (Medical): No    Lack of Transportation (Non-Medical): No  Physical Activity: Inactive (02/02/2023)   Exercise Vital Sign  Days of Exercise per Week: 0 days    Minutes of Exercise per Session: 0 min  Stress: No Stress Concern Present (02/02/2023)   [employer] of Occupational Health - Occupational Stress Questionnaire    Feeling of Stress : Not at all  Social Connections: Socially Integrated (02/02/2023)   Social Connection and Isolation Panel [NHANES]    Frequency of Communication with Friends and Family: More than three times a week    Frequency of Social Gatherings with Friends and Family: Three times a week    Attends Religious Services: More than 4 times per year    Active Member of Clubs or Organizations: Yes    Attends Banker Meetings: Never    Marital Status: Married  Catering manager  Violence: Not At Risk (02/02/2023)   Humiliation, Afraid, Rape, and Kick questionnaire    Fear of Current or Ex-Partner: No    Emotionally Abused: No    Physically Abused: No    Sexually Abused: No    Review of Systems  All other systems reviewed and are negative.       Objective    BP (!) 135/93   Pulse 69   Temp 98.1 F (36.7 C) (Oral)   Resp 16   Wt (!) 305 lb 9.6 oz (138.6 kg)   LMP 05/13/2020   SpO2 98%   BMI 54.13 kg/m   Physical Exam Vitals and nursing note reviewed.  Constitutional:      General: She is not in acute distress.    Appearance: She is obese.  Cardiovascular:     Rate and Rhythm: Normal rate and regular rhythm.  Pulmonary:     Effort: Pulmonary effort is normal.     Breath sounds: Normal breath sounds.  Abdominal:     Palpations: Abdomen is soft.     Tenderness: There is no abdominal tenderness.  Neurological:     General: No focal deficit present.     Mental Status: She is alert and oriented to person, place, and time.         Assessment & Plan:   1. Essential hypertension Improved readings. Will increase spironolactone from 25 to 50 mg daily.   Return in about 4 weeks (around 04/11/2023) for follow up.   Tommie Raymond, MD

## 2023-03-21 ENCOUNTER — Other Ambulatory Visit: Payer: Self-pay

## 2023-03-22 ENCOUNTER — Other Ambulatory Visit: Payer: Self-pay

## 2023-04-11 ENCOUNTER — Ambulatory Visit: Payer: BC Managed Care – PPO | Admitting: Family Medicine

## 2023-04-11 ENCOUNTER — Other Ambulatory Visit: Payer: Self-pay

## 2023-04-11 VITALS — BP 132/90 | HR 68 | Temp 98.1°F | Resp 16 | Wt 298.0 lb

## 2023-04-11 DIAGNOSIS — Z6841 Body Mass Index (BMI) 40.0 and over, adult: Secondary | ICD-10-CM | POA: Diagnosis not present

## 2023-04-11 DIAGNOSIS — R7303 Prediabetes: Secondary | ICD-10-CM

## 2023-04-11 DIAGNOSIS — I10 Essential (primary) hypertension: Secondary | ICD-10-CM | POA: Diagnosis not present

## 2023-04-11 MED ORDER — CARVEDILOL 12.5 MG PO TABS
12.5000 mg | ORAL_TABLET | Freq: Two times a day (BID) | ORAL | 3 refills | Status: DC
  Filled 2023-04-11 – 2023-05-11 (×3): qty 180, 90d supply, fill #0
  Filled 2023-09-29: qty 180, 90d supply, fill #1
  Filled 2023-11-22 – 2023-11-28 (×2): qty 180, 90d supply, fill #2
  Filled 2023-11-28: qty 60, 30d supply, fill #2
  Filled 2024-01-03: qty 60, 30d supply, fill #3

## 2023-04-11 NOTE — Progress Notes (Unsigned)
New Patient Office Visit  Subjective    Patient ID: Carolyn Mcdonald, female    DOB: 1966/02/11  Age: 57 y.o. MRN: [id number]  CC:  Chief Complaint  Patient presents with   Follow-up    HPI Carolyn Mcdonald presents to establish care   Outpatient Encounter Medications as of 04/11/2023  Medication Sig   acetaminophen (TYLENOL) 650 MG CR tablet Take 650 mg by mouth every 8 (eight) hours as needed for pain.   carvedilol (COREG) 12.5 MG tablet Take 1 tablet (12.5 mg total) by mouth 2 (two) times daily with a meal.   gabapentin (NEURONTIN) 300 MG capsule Take 1 capsule (300 mg total) by mouth 3 (three) times daily.   Multiple Vitamin (MULTIVITAMIN) tablet Take 1 tablet by mouth daily.   rivaroxaban (XARELTO) 20 MG TABS tablet TAKE 1 TABLET (20 MG TOTAL) BY MOUTH DAILY WITH SUPPER.   spironolactone (ALDACTONE) 25 MG tablet Take 1 tablet (25 mg total) by mouth daily.   spironolactone (ALDACTONE) 50 MG tablet Take 1 tablet (50 mg total) by mouth daily.   triamcinolone cream (KENALOG) 0.1 % Apply 1 Application topically 2 (two) times daily.   valsartan (DIOVAN) 160 MG tablet Take 1 tablet (160 mg total) by mouth daily.   Vitamin D, Ergocalciferol, (DRISDOL) 1.25 MG (50000 UNIT) CAPS capsule Take 1 capsule (50,000 Units total) by mouth every 7 (seven) days.   No facility-administered encounter medications on file as of 04/11/2023.    Past Medical History:  Diagnosis Date   DVT (deep venous thrombosis) (HCC)    Fibroid    Hypertension     Past Surgical History:  Procedure Laterality Date   ANKLE SURGERY     R ankle    CESAREAN SECTION     x3   HYSTERECTOMY ABDOMINAL WITH SALPINGECTOMY Bilateral 07/21/2020   Procedure: SUPRACERVICAL HYSTERECTOMY  WITH BILATERAL PARTIAL SALPINGECTOMY;  Surgeon: Carolyn Antigua, MD;  Location: [facility];  Service: Gynecology;  Laterality: Bilateral;  tap block   SUPRACERVICAL ABDOMINAL HYSTERECTOMY  07/21/2020   TOE SURGERY     R Great toe    TUBAL  LIGATION  1993    No family history on file.  Social History   Socioeconomic History   Marital status: Married    Spouse name: Not on file   Number of children: Not on file   Years of education: Not on file   Highest education level: Not on file  Occupational History   Not on file  Tobacco Use   Smoking status: Never   Smokeless tobacco: Never  Vaping Use   Vaping status: Never Used  Substance and Sexual Activity   Alcohol use: Never   Drug use: Never   Sexual activity: Not Currently    Birth control/protection: Surgical  Other Topics Concern   Not on file  Social History Narrative   Not on file   Social Determinants of Health   Financial Resource Strain: Low Risk  (02/02/2023)   Overall Financial Resource Strain (CARDIA)    Difficulty of Paying Living Expenses: Not very hard  Food Insecurity: No Food Insecurity (02/02/2023)   Hunger Vital Sign    Worried About Running Out of Food in the Last Year: Never true    Ran Out of Food in the Last Year: Never true  Transportation Needs: No Transportation Needs (02/02/2023)   PRAPARE - Administrator, Civil Service (Medical): No    Lack of Transportation (Non-Medical): No  Physical Activity: Inactive (02/02/2023)  Exercise Vital Sign    Days of Exercise per Week: 0 days    Minutes of Exercise per Session: 0 min  Stress: No Stress Concern Present (02/02/2023)   [employer] of Occupational Health - Occupational Stress Questionnaire    Feeling of Stress : Not at all  Social Connections: Socially Integrated (02/02/2023)   Social Connection and Isolation Panel [NHANES]    Frequency of Communication with Friends and Family: More than three times a week    Frequency of Social Gatherings with Friends and Family: Three times a week    Attends Religious Services: More than 4 times per year    Active Member of Clubs or Organizations: Yes    Attends Banker Meetings: Never    Marital Status: Married   Catering manager Violence: Not At Risk (02/02/2023)   Humiliation, Afraid, Rape, and Kick questionnaire    Fear of Current or Ex-Partner: No    Emotionally Abused: No    Physically Abused: No    Sexually Abused: No    ROS      Objective    BP (!) 132/90   Pulse 68   Temp 98.1 F (36.7 C) (Oral)   Resp 16   Wt 298 lb (135.2 kg)   LMP 05/13/2020   SpO2 98%   BMI 52.79 kg/m   Physical Exam  {Labs (Optional):23779}    Assessment & Plan:   Problem List Items Addressed This Visit       Cardiovascular and Mediastinum   Essential hypertension - Primary    No follow-ups on file.   Carolyn Raymond, MD

## 2023-04-13 ENCOUNTER — Encounter: Payer: Self-pay | Admitting: Family Medicine

## 2023-04-18 ENCOUNTER — Other Ambulatory Visit: Payer: Self-pay

## 2023-04-28 ENCOUNTER — Other Ambulatory Visit: Payer: Self-pay

## 2023-05-08 ENCOUNTER — Other Ambulatory Visit: Payer: Self-pay

## 2023-05-11 ENCOUNTER — Other Ambulatory Visit: Payer: Self-pay

## 2023-05-12 ENCOUNTER — Other Ambulatory Visit: Payer: Self-pay

## 2023-05-25 ENCOUNTER — Other Ambulatory Visit: Payer: Self-pay

## 2023-05-25 NOTE — Progress Notes (Signed)
   Carolyn Mcdonald 11/01/1965 [id number]  Patient outreached by Thomasene Ripple , PharmD Candidate on 05/25/23.  Blood Pressure Readings: Last documented ambulatory systolic blood pressure: 132 Last documented ambulatory diastolic blood pressure: 90 Does the patient have a validated home blood pressure machine?: Yes They report home readings 128/89 mmHG, 113/73mmHG, 115/39mmHg. She checks everyday when she gets off work.   Medication review was performed. Is the patient taking their medications as prescribed?: Yes Patient wants to discuss with PCP coming off a blood pressure medication. She reports that she does not like being on so many BP medications. Patient also is under a lot of stress and sometimes has trouble affording medications.   The following barriers to adherence were noted: Does the patient have cost concerns?: Yes Does the patient have transportation concerns?: No Does the patient need assistance obtaining refills?: No Does the patient occassionally forget to take some of their prescribed medications?: No Does the patient feel like one/some of their medications make them feel poorly?: No Does the patient have questions or concerns about their medications?: Yes Does the patient have a follow up scheduled with their primary care provider/cardiologist?: Yes  Interventions: Interventions Completed: Medications were reviewed, Patient was educated on goal blood pressures and long term health implications of elevated blood pressure, Patient was educated on medications, including indication and administration  The patient has follow up scheduled:  PCP: Georganna Skeans, MD   Thomasene Ripple, Student-PharmD

## 2023-06-21 ENCOUNTER — Other Ambulatory Visit: Payer: Self-pay | Admitting: Family Medicine

## 2023-06-21 ENCOUNTER — Other Ambulatory Visit: Payer: Self-pay

## 2023-06-21 DIAGNOSIS — Z86718 Personal history of other venous thrombosis and embolism: Secondary | ICD-10-CM

## 2023-06-21 MED ORDER — SPIRONOLACTONE 50 MG PO TABS
50.0000 mg | ORAL_TABLET | Freq: Every day | ORAL | 0 refills | Status: DC
  Filled 2023-06-21 – 2023-09-28 (×2): qty 90, 90d supply, fill #0

## 2023-06-21 MED ORDER — SPIRONOLACTONE 25 MG PO TABS
25.0000 mg | ORAL_TABLET | Freq: Every day | ORAL | 0 refills | Status: DC
  Filled 2023-06-21 – 2023-09-29 (×2): qty 90, 90d supply, fill #0

## 2023-06-21 MED ORDER — RIVAROXABAN 20 MG PO TABS
20.0000 mg | ORAL_TABLET | Freq: Every day | ORAL | 1 refills | Status: DC
Start: 2023-06-21 — End: 2024-01-19
  Filled 2023-06-21: qty 90, 90d supply, fill #0
  Filled 2023-09-28: qty 90, 90d supply, fill #1

## 2023-06-21 NOTE — Telephone Encounter (Signed)
Appointment scheduled 06/27/23- labs due note added to appointment note Requested Prescriptions  Pending Prescriptions Disp Refills   spironolactone (ALDACTONE) 25 MG tablet 90 tablet 0    Sig: Take 1 tablet (25 mg total) by mouth daily.     Cardiovascular: Diuretics - Aldosterone Antagonist Failed - 06/21/2023  1:04 AM      Failed - Cr in normal range and within 180 days    Creatinine, Ser  Date Value Ref Range Status  08/02/2022 0.76 0.57 - 1.00 mg/dL Final         Failed - K in normal range and within 180 days    Potassium  Date Value Ref Range Status  08/02/2022 4.0 3.5 - 5.2 mmol/L Final         Failed - Na in normal range and within 180 days    Sodium  Date Value Ref Range Status  08/02/2022 141 134 - 144 mmol/L Final         Failed - eGFR is 30 or above and within 180 days    GFR calc Af Amer  Date Value Ref Range Status  04/27/2020 102 >59 mL/min/1.73 Final    Comment:    **[facility] currently reports eGFR in compliance with the current**   recommendations of the [employer]. [facility] will   update reporting as new guidelines are published from the NKF-ASN   Task force.    GFR, Estimated  Date Value Ref Range Status  07/15/2020 >60 >60 mL/min Final    Comment:    (NOTE) Calculated using the CKD-EPI Creatinine Equation (2021)    eGFR  Date Value Ref Range Status  08/02/2022 91 >59 mL/min/1.73 Final         Failed - Last BP in normal range    BP Readings from Last 1 Encounters:  04/11/23 (!) 132/90         Passed - Valid encounter within last 6 months    Recent Outpatient Visits           2 months ago Essential hypertension   [place] [facility], MD   3 months ago Essential hypertension   [place] [facility], MD   4 months ago Essential hypertension   [place] [facility], MD   5 months ago Essential hypertension   [facility], MD   6 months ago Need for varicella vaccine   [facility], MD       Future Appointments             In 6 days Georganna Skeans, MD [facility] at [facility]             spironolactone (ALDACTONE) 50 MG tablet 90 tablet 0    Sig: Take 1 tablet (50 mg total) by mouth daily.     Cardiovascular: Diuretics - Aldosterone Antagonist Failed - 06/21/2023  1:04 AM      Failed - Cr in normal range and within 180 days    Creatinine, Ser  Date Value Ref Range Status  08/02/2022 0.76 0.57 - 1.00 mg/dL Final         Failed - K in normal range and within 180 days    Potassium  Date Value Ref Range Status  08/02/2022 4.0 3.5 - 5.2 mmol/L Final  Failed - Na in normal range and within 180 days    Sodium  Date Value Ref Range Status  08/02/2022 141 134 - 144 mmol/L Final         Failed - eGFR is 30 or above and within 180 days    GFR calc Af Amer  Date Value Ref Range Status  04/27/2020 102 >59 mL/min/1.73 Final    Comment:    **[facility] currently reports eGFR in compliance with the current**   recommendations of the [employer]. [facility] will   update reporting as new guidelines are published from the NKF-ASN   Task force.    GFR, Estimated  Date Value Ref Range Status  07/15/2020 >60 >60 mL/min Final    Comment:    (NOTE) Calculated using the CKD-EPI Creatinine Equation (2021)    eGFR  Date Value Ref Range Status  08/02/2022 91 >59 mL/min/1.73 Final         Failed - Last BP in normal range    BP Readings from Last 1 Encounters:  04/11/23 (!) 132/90         Passed - Valid encounter within last 6 months    Recent Outpatient Visits           2 months ago Essential hypertension   [place] [facility], MD   3 months ago Essential hypertension   [place] [facility], MD   4 months ago Essential hypertension   [place] [facility] at [facility], MD   5 months ago Essential hypertension    Primary Care at [facility], MD   6 months ago Need for varicella vaccine    Primary Care at [facility], MD       Future Appointments             In 6 days Georganna Skeans, MD [facility]

## 2023-06-21 NOTE — Telephone Encounter (Signed)
Requested Prescriptions  Pending Prescriptions Disp Refills   rivaroxaban (XARELTO) 20 MG TABS tablet 90 tablet 1    Sig: TAKE 1 TABLET (20 MG TOTAL) BY MOUTH DAILY WITH SUPPER.     Hematology: Anticoagulants - rivaroxaban Passed - 06/21/2023  9:38 AM      Passed - ALT in normal range and within 360 days    ALT  Date Value Ref Range Status  08/02/2022 14 0 - 32 IU/L Final         Passed - AST in normal range and within 360 days    AST  Date Value Ref Range Status  08/02/2022 16 0 - 40 IU/L Final         Passed - Cr in normal range and within 360 days    Creatinine, Ser  Date Value Ref Range Status  08/02/2022 0.76 0.57 - 1.00 mg/dL Final         Passed - HCT in normal range and within 360 days    Hematocrit  Date Value Ref Range Status  08/02/2022 37.9 34.0 - 46.6 % Final         Passed - HGB in normal range and within 360 days    Hemoglobin  Date Value Ref Range Status  08/02/2022 12.7 11.1 - 15.9 g/dL Final         Passed - PLT in normal range and within 360 days    Platelets  Date Value Ref Range Status  08/02/2022 231 150 - 450 x10E3/uL Final         Passed - eGFR is 15 or above and within 360 days    GFR calc Af Amer  Date Value Ref Range Status  04/27/2020 102 >59 mL/min/1.73 Final    Comment:    **[facility] currently reports eGFR in compliance with the current**   recommendations of the [employer]. [facility] will   update reporting as new guidelines are published from the NKF-ASN   Task force.    GFR, Estimated  Date Value Ref Range Status  07/15/2020 >60 >60 mL/min Final    Comment:    (NOTE) Calculated using the CKD-EPI Creatinine Equation (2021)    eGFR  Date Value Ref Range Status  08/02/2022 91 >59 mL/min/1.73 Final         Passed - Patient is not pregnant      Passed - Valid encounter within last 12 months    Recent Outpatient Visits           2 months ago Essential hypertension   [place] [facility], MD   3 months ago Essential hypertension   [place] [facility] at [facility], MD   4 months ago Essential hypertension   [place] [facility] at [facility], MD   5 months ago Essential hypertension   [place] [facility], MD   6 months ago Need for varicella vaccine   [place] [facility], MD       Future Appointments             In 6 days Georganna Skeans, MD [facility] at [facility]

## 2023-06-27 ENCOUNTER — Ambulatory Visit (INDEPENDENT_AMBULATORY_CARE_PROVIDER_SITE_OTHER): Payer: BC Managed Care – PPO | Admitting: Family Medicine

## 2023-06-27 ENCOUNTER — Encounter: Payer: Self-pay | Admitting: Family Medicine

## 2023-06-27 VITALS — BP 169/110 | HR 73 | Temp 98.1°F | Resp 18 | Ht 63.0 in | Wt 308.6 lb

## 2023-06-27 DIAGNOSIS — Z130 Encounter for screening for diseases of the blood and blood-forming organs and certain disorders involving the immune mechanism: Secondary | ICD-10-CM | POA: Diagnosis not present

## 2023-06-27 DIAGNOSIS — Z23 Encounter for immunization: Secondary | ICD-10-CM

## 2023-06-27 DIAGNOSIS — Z13228 Encounter for screening for other metabolic disorders: Secondary | ICD-10-CM

## 2023-06-27 DIAGNOSIS — I10 Essential (primary) hypertension: Secondary | ICD-10-CM

## 2023-06-27 DIAGNOSIS — Z0000 Encounter for general adult medical examination without abnormal findings: Secondary | ICD-10-CM | POA: Diagnosis not present

## 2023-06-27 DIAGNOSIS — Z13220 Encounter for screening for lipoid disorders: Secondary | ICD-10-CM

## 2023-06-27 DIAGNOSIS — Z1329 Encounter for screening for other suspected endocrine disorder: Secondary | ICD-10-CM

## 2023-06-28 ENCOUNTER — Other Ambulatory Visit: Payer: Self-pay

## 2023-06-28 LAB — LIPID PANEL
Chol/HDL Ratio: 3.1 {ratio} (ref 0.0–4.4)
Cholesterol, Total: 206 mg/dL — ABNORMAL HIGH (ref 100–199)
HDL: 67 mg/dL (ref >39)
LDL Chol Calc (NIH): 130 mg/dL — ABNORMAL HIGH (ref 0–99)
Triglycerides: 50 mg/dL (ref 0–149)
VLDL Cholesterol Cal: 9 mg/dL (ref 5–40)

## 2023-06-28 LAB — CBC WITH DIFFERENTIAL/PLATELET
Basophils Absolute: 0 10*3/uL (ref 0.0–0.2)
Basos: 1 %
EOS (ABSOLUTE): 0.2 10*3/uL (ref 0.0–0.4)
Eos: 4 %
Hematocrit: 38.6 % (ref 34.0–46.6)
Hemoglobin: 12.4 g/dL (ref 11.1–15.9)
Immature Grans (Abs): 0 10*3/uL (ref 0.0–0.1)
Immature Granulocytes: 0 %
Lymphocytes Absolute: 1.5 10*3/uL (ref 0.7–3.1)
Lymphs: 39 %
MCH: 30.7 pg (ref 26.6–33.0)
MCHC: 32.1 g/dL (ref 31.5–35.7)
MCV: 96 fL (ref 79–97)
Monocytes Absolute: 0.5 10*3/uL (ref 0.1–0.9)
Monocytes: 13 %
Neutrophils Absolute: 1.6 10*3/uL (ref 1.4–7.0)
Neutrophils: 43 %
Platelets: 217 10*3/uL (ref 150–450)
RBC: 4.04 x10E6/uL (ref 3.77–5.28)
RDW: 12.7 % (ref 11.7–15.4)
WBC: 3.8 10*3/uL (ref 3.4–10.8)

## 2023-06-28 LAB — CMP14+EGFR
ALT: 11 [IU]/L (ref 0–32)
AST: 16 [IU]/L (ref 0–40)
Albumin: 3.8 g/dL (ref 3.8–4.9)
Alkaline Phosphatase: 77 [IU]/L (ref 44–121)
BUN/Creatinine Ratio: 10 (ref 9–23)
BUN: 8 mg/dL (ref 6–24)
Bilirubin Total: 0.7 mg/dL (ref 0.0–1.2)
CO2: 24 mmol/L (ref 20–29)
Calcium: 9.2 mg/dL (ref 8.7–10.2)
Chloride: 105 mmol/L (ref 96–106)
Creatinine, Ser: 0.82 mg/dL (ref 0.57–1.00)
Globulin, Total: 3.5 g/dL (ref 1.5–4.5)
Glucose: 81 mg/dL (ref 70–99)
Potassium: 4.2 mmol/L (ref 3.5–5.2)
Sodium: 142 mmol/L (ref 134–144)
Total Protein: 7.3 g/dL (ref 6.0–8.5)
eGFR: 83 mL/min/{1.73_m2} (ref >59)

## 2023-06-28 LAB — HEMOGLOBIN A1C
Est. average glucose Bld gHb Est-mCnc: 120 mg/dL
Hgb A1c MFr Bld: 5.8 % — ABNORMAL HIGH (ref 4.8–5.6)

## 2023-06-28 LAB — VITAMIN D 25 HYDROXY (VIT D DEFICIENCY, FRACTURES): Vit D, 25-Hydroxy: 15.5 ng/mL — ABNORMAL LOW (ref 30.0–100.0)

## 2023-06-29 ENCOUNTER — Other Ambulatory Visit: Payer: Self-pay

## 2023-06-29 ENCOUNTER — Encounter: Payer: Self-pay | Admitting: Family Medicine

## 2023-06-29 MED ORDER — VITAMIN D (ERGOCALCIFEROL) 1.25 MG (50000 UNIT) PO CAPS
50000.0000 [IU] | ORAL_CAPSULE | ORAL | 0 refills | Status: DC
  Filled 2023-06-29 – 2023-09-28 (×2): qty 12, 84d supply, fill #0

## 2023-06-29 NOTE — Progress Notes (Signed)
Established Patient Office Visit  Subjective    Patient ID: Carolyn Mcdonald, female    DOB: Aug 09, 1966  Age: 57 y.o. MRN: [id number]  CC:  Chief Complaint  Patient presents with   Annual Exam    Lab work    HPI Carolyn Mcdonald presents for annual exam. Patient denies acute complaints.   Outpatient Encounter Medications as of 06/27/2023  Medication Sig   acetaminophen (TYLENOL) 650 MG CR tablet Take 650 mg by mouth every 8 (eight) hours as needed for pain.   carvedilol (COREG) 12.5 MG tablet Take 1 tablet (12.5 mg total) by mouth 2 (two) times daily with a meal.   gabapentin (NEURONTIN) 300 MG capsule Take 1 capsule (300 mg total) by mouth 3 (three) times daily.   Multiple Vitamin (MULTIVITAMIN) tablet Take 1 tablet by mouth daily.   rivaroxaban (XARELTO) 20 MG TABS tablet TAKE 1 TABLET (20 MG TOTAL) BY MOUTH DAILY WITH SUPPER.   spironolactone (ALDACTONE) 25 MG tablet Take 1 tablet (25 mg total) by mouth daily.   spironolactone (ALDACTONE) 50 MG tablet Take 1 tablet (50 mg total) by mouth daily.   triamcinolone cream (KENALOG) 0.1 % Apply 1 Application topically 2 (two) times daily.   valsartan (DIOVAN) 160 MG tablet Take 1 tablet (160 mg total) by mouth daily.   Vitamin D, Ergocalciferol, (DRISDOL) 1.25 MG (50000 UNIT) CAPS capsule Take 1 capsule (50,000 Units total) by mouth every 7 (seven) days.   No facility-administered encounter medications on file as of 06/27/2023.    Past Medical History:  Diagnosis Date   DVT (deep venous thrombosis) (HCC)    Fibroid    Hypertension     Past Surgical History:  Procedure Laterality Date   ANKLE SURGERY     R ankle    CESAREAN SECTION     x3   HYSTERECTOMY ABDOMINAL WITH SALPINGECTOMY Bilateral 07/21/2020   Procedure: SUPRACERVICAL HYSTERECTOMY  WITH BILATERAL PARTIAL SALPINGECTOMY;  Surgeon: Catalina Antigua, MD;  Location: [facility] OR;  Service: Gynecology;  Laterality: Bilateral;  tap block   SUPRACERVICAL ABDOMINAL HYSTERECTOMY   07/21/2020   TOE SURGERY     R Great toe    TUBAL LIGATION  1993    History reviewed. No pertinent family history.  Social History   Socioeconomic History   Marital status: Married    Spouse name: Not on file   Number of children: Not on file   Years of education: Not on file   Highest education level: Not on file  Occupational History   Not on file  Tobacco Use   Smoking status: Never   Smokeless tobacco: Never  Vaping Use   Vaping status: Never Used  Substance and Sexual Activity   Alcohol use: Never   Drug use: Never   Sexual activity: Not Currently    Birth control/protection: Surgical  Other Topics Concern   Not on file  Social History Narrative   Not on file   Social Determinants of Health   Financial Resource Strain: Low Risk  (02/02/2023)   Overall Financial Resource Strain (CARDIA)    Difficulty of Paying Living Expenses: Not very hard  Food Insecurity: No Food Insecurity (02/02/2023)   Hunger Vital Sign    Worried About Running Out of Food in the Last Year: Never true    Ran Out of Food in the Last Year: Never true  Transportation Needs: No Transportation Needs (02/02/2023)   PRAPARE - Administrator, Civil Service (Medical): No  Lack of Transportation (Non-Medical): No  Physical Activity: Inactive (02/02/2023)   Exercise Vital Sign    Days of Exercise per Week: 0 days    Minutes of Exercise per Session: 0 min  Stress: No Stress Concern Present (02/02/2023)   [employer] of Occupational Health - Occupational Stress Questionnaire    Feeling of Stress : Not at all  Social Connections: Socially Integrated (02/02/2023)   Social Connection and Isolation Panel [NHANES]    Frequency of Communication with Friends and Family: More than three times a week    Frequency of Social Gatherings with Friends and Family: Three times a week    Attends Religious Services: More than 4 times per year    Active Member of Clubs or Organizations: Yes     Attends Banker Meetings: Never    Marital Status: Married  Catering manager Violence: Not At Risk (02/02/2023)   Humiliation, Afraid, Rape, and Kick questionnaire    Fear of Current or Ex-Partner: No    Emotionally Abused: No    Physically Abused: No    Sexually Abused: No    Review of Systems  All other systems reviewed and are negative.       Objective    BP (!) 169/110 (BP Location: Left Arm, Patient Position: Sitting, Cuff Size: Large)   Pulse 73   Temp 98.1 F (36.7 C) (Oral)   Resp 18   Ht 5\' 3"  (1.6 m)   Wt (!) 308 lb 9.6 oz (140 kg)   LMP 05/13/2020   SpO2 (!) 69%   BMI 54.67 kg/m   Physical Exam Vitals and nursing note reviewed.  Constitutional:      General: She is not in acute distress.    Appearance: She is obese.  HENT:     Head: Normocephalic and atraumatic.     Right Ear: Tympanic membrane, ear canal and external ear normal.     Left Ear: Tympanic membrane, ear canal and external ear normal.     Nose: Nose normal.     Mouth/Throat:     Mouth: Mucous membranes are moist.     Pharynx: Oropharynx is clear.  Eyes:     Conjunctiva/sclera: Conjunctivae normal.     Pupils: Pupils are equal, round, and reactive to light.  Neck:     Thyroid: No thyromegaly.  Cardiovascular:     Rate and Rhythm: Normal rate and regular rhythm.     Heart sounds: Normal heart sounds. No murmur heard. Pulmonary:     Effort: Pulmonary effort is normal. No respiratory distress.     Breath sounds: Normal breath sounds.  Abdominal:     General: There is no distension.     Palpations: Abdomen is soft. There is no mass.     Tenderness: There is no abdominal tenderness.  Musculoskeletal:        General: Normal range of motion.     Cervical back: Normal range of motion and neck supple.  Skin:    General: Skin is warm and dry.  Neurological:     General: No focal deficit present.     Mental Status: She is alert and oriented to person, place, and time.   Psychiatric:        Mood and Affect: Mood normal.        Behavior: Behavior normal.         Assessment & Plan:   Annual physical exam -     CMP14+EGFR  Encounter for immunization -  Flu vaccine trivalent PF, 6mos and older(Flulaval,Afluria,Fluarix,Fluzone)  Screening for deficiency anemia -     CBC with Differential/Platelet  Screening for lipid disorders -     Lipid panel  Screening for endocrine/metabolic/immunity disorders -     Hemoglobin A1c -     VITAMIN D 25 Hydroxy (Vit-D Deficiency, Fractures)  Uncontrolled hypertension     No follow-ups on file.   Tommie Raymond, MD

## 2023-07-11 ENCOUNTER — Other Ambulatory Visit: Payer: Self-pay

## 2023-09-28 ENCOUNTER — Other Ambulatory Visit: Payer: Self-pay | Admitting: Family Medicine

## 2023-09-29 ENCOUNTER — Other Ambulatory Visit: Payer: Self-pay

## 2023-09-29 NOTE — Telephone Encounter (Signed)
Requested medications are due for refill today.  Yes - last refill was over 1 year ago  Requested medications are on the active medications list.  yes  Last refill. 06/24/2022 #90 3 rf  Future visit scheduled.   no  Notes to clinic.  Please review for refill.    Requested Prescriptions  Pending Prescriptions Disp Refills   gabapentin (NEURONTIN) 300 MG capsule 90 capsule 3    Sig: Take 1 capsule (300 mg total) by mouth 3 (three) times daily.     Neurology: Anticonvulsants - gabapentin Passed - 09/29/2023  9:16 AM      Passed - Cr in normal range and within 360 days    Creatinine, Ser  Date Value Ref Range Status  06/27/2023 0.82 0.57 - 1.00 mg/dL Final         Passed - Completed PHQ-2 or PHQ-9 in the last 360 days      Passed - Valid encounter within last 12 months    Recent Outpatient Visits           3 months ago Annual physical exam   [place] [facility], MD   5 months ago Essential hypertension   [place] [facility], MD   6 months ago Essential hypertension   [place] [facility], MD   7 months ago Essential hypertension   [place] [facility], MD   8 months ago Essential hypertension   [place] [facility], MD

## 2023-10-01 ENCOUNTER — Other Ambulatory Visit: Payer: Self-pay

## 2023-10-02 ENCOUNTER — Other Ambulatory Visit: Payer: Self-pay

## 2023-10-31 ENCOUNTER — Emergency Department (HOSPITAL_COMMUNITY)
Admission: EM | Admit: 2023-10-31 | Discharge: 2023-10-31 | Disposition: A | Discharge status: Home / Self Care | Payer: Self-pay | Attending: Emergency Medicine | Admitting: Emergency Medicine

## 2023-10-31 ENCOUNTER — Emergency Department (HOSPITAL_COMMUNITY): Payer: Self-pay

## 2023-10-31 ENCOUNTER — Other Ambulatory Visit: Payer: Self-pay

## 2023-10-31 DIAGNOSIS — I2089 Other forms of angina pectoris: Secondary | ICD-10-CM | POA: Insufficient documentation

## 2023-10-31 DIAGNOSIS — I10 Essential (primary) hypertension: Secondary | ICD-10-CM | POA: Insufficient documentation

## 2023-10-31 DIAGNOSIS — F419 Anxiety disorder, unspecified: Secondary | ICD-10-CM | POA: Insufficient documentation

## 2023-10-31 DIAGNOSIS — R079 Chest pain, unspecified: Secondary | ICD-10-CM

## 2023-10-31 DIAGNOSIS — Z7901 Long term (current) use of anticoagulants: Secondary | ICD-10-CM | POA: Insufficient documentation

## 2023-10-31 DIAGNOSIS — Z79899 Other long term (current) drug therapy: Secondary | ICD-10-CM | POA: Insufficient documentation

## 2023-10-31 LAB — BASIC METABOLIC PANEL
Anion gap: 10 (ref 5–15)
BUN: 8 mg/dL (ref 6–20)
CO2: 26 mmol/L (ref 22–32)
Calcium: 9.3 mg/dL (ref 8.9–10.3)
Chloride: 104 mmol/L (ref 98–111)
Creatinine, Ser: 0.74 mg/dL (ref 0.44–1.00)
GFR, Estimated: >60 mL/min (ref >60)
Glucose, Bld: 118 mg/dL — ABNORMAL HIGH (ref 70–99)
Potassium: 4.1 mmol/L (ref 3.5–5.1)
Sodium: 140 mmol/L (ref 135–145)

## 2023-10-31 LAB — CBC
HCT: 39.6 % (ref 36.0–46.0)
Hemoglobin: 12.9 g/dL (ref 12.0–15.0)
MCH: 30.3 pg (ref 26.0–34.0)
MCHC: 32.6 g/dL (ref 30.0–36.0)
MCV: 93 fL (ref 80.0–100.0)
Platelets: 244 10*3/uL (ref 150–400)
RBC: 4.26 MIL/uL (ref 3.87–5.11)
RDW: 12.3 % (ref 11.5–15.5)
WBC: 5.6 10*3/uL (ref 4.0–10.5)
nRBC: 0 % (ref 0.0–0.2)

## 2023-10-31 LAB — TROPONIN I (HIGH SENSITIVITY)
Troponin I (High Sensitivity): 3 ng/L (ref <18)
Troponin I (High Sensitivity): 3 ng/L (ref <18)

## 2023-10-31 NOTE — ED Notes (Signed)
 Patient transported to X-ray

## 2023-10-31 NOTE — ED Provider Triage Note (Signed)
 Emergency Medicine Provider Triage Evaluation Note  Carolyn Mcdonald , a 58 y.o. female  was evaluated in triage.  Pt complains of right sided point tenderness of the right anterior chest wall.  Symptoms began 1 week ago.  Pain is intermittent.  She reports that it comes on for several seconds at a time.  She denies current symptoms in triage.  She became anxious about this pain and also her mildly elevated blood pressure.  She decided to come to the ED for evaluation this morning.  Review of Systems  Positive: Right-sided chest pain Negative: Current chest pain, shortness of breath, nausea, vomiting  Physical Exam  BP (!) 194/110 (BP Location: Right Arm)   Pulse 76   Temp 98.4 F (36.9 C)   Resp 17   Ht 5\' 3"  (1.6 m)   Wt 136.1 kg   LMP 05/13/2020   SpO2 100%   BMI 53.14 kg/m  Gen:   Awake, no distress   Resp:  Normal effort  MSK:   Moves extremities without difficulty  Other:    Medical Decision Making  Medically screening exam initiated at 9:33 AM.  Appropriate orders placed.  Carolyn Mcdonald was informed that the remainder of the evaluation will be completed by another provider, this initial triage assessment does not replace that evaluation, and the importance of remaining in the ED until their evaluation is complete.  Patient is aware of triage process and need to remain for entirety of ED evaluation process.   Carolyn Fines, MD 10/31/23 [phone]

## 2023-10-31 NOTE — ED Triage Notes (Signed)
 Pt. Stated, I think my pressure is up.  Its been like this for a couple of days and just not feeling good. I also feel like I have someone pushing their thumb through my chest and goes through my back. I did feel a little nausea.

## 2023-10-31 NOTE — Discharge Instructions (Addendum)
 While you are in the emergency room, you had blood work done that was normal.  Your EKG was normal, your chest x-ray was normal.  At this time I do not have a clear cause for your symptoms.  I have given you follow-up with a cardiologist.  The contact you within 1 week for a follow-up appointment.  I would also recommend that you follow-up with your primary care doctor within 1 week.  Return to the emergency department for any worsening pain, new pain, or shortness of breath.

## 2023-10-31 NOTE — ED Provider Notes (Signed)
 [place] EMERGENCY DEPARTMENT AT [facility] Provider Note   CSN: [id number] Arrival date & time: 10/31/23  0845     History  Chief Complaint  Patient presents with   Hypertension   Chest Pain   Back Pain    Carolyn Mcdonald is a 58 y.o. female.  58 year old female is here today for pain on the right side of her chest wall, associated nausea which has occurred intermittently over the last 1 week.  Patient says that when she feels this, she becomes bit anxious, and is concerned about her high blood pressure.   Hypertension Associated symptoms include chest pain.  Chest Pain Associated symptoms: back pain   Back Pain Associated symptoms: chest pain        Home Medications Prior to Admission medications   Medication Sig Start Date End Date Taking? Authorizing Provider  acetaminophen (TYLENOL) 650 MG CR tablet Take 650 mg by mouth every 8 (eight) hours as needed for pain.    [provider]  carvedilol (COREG) 12.5 MG tablet Take 1 tablet (12.5 mg total) by mouth 2 (two) times daily with a meal. 04/11/23   Georganna Skeans, MD  gabapentin (NEURONTIN) 300 MG capsule Take 1 capsule (300 mg total) by mouth 3 (three) times daily. 06/24/22   Georganna Skeans, MD  Multiple Vitamin (MULTIVITAMIN) tablet Take 1 tablet by mouth daily.    [provider]  rivaroxaban (XARELTO) 20 MG TABS tablet TAKE 1 TABLET (20 MG TOTAL) BY MOUTH DAILY WITH SUPPER. 06/21/23   Georganna Skeans, MD  spironolactone (ALDACTONE) 25 MG tablet Take 1 tablet (25 mg total) by mouth daily. 06/21/23   Georganna Skeans, MD  spironolactone (ALDACTONE) 50 MG tablet Take 1 tablet (50 mg total) by mouth daily. 06/21/23   Georganna Skeans, MD  triamcinolone cream (KENALOG) 0.1 % Apply 1 Application topically 2 (two) times daily. 01/19/23   Georganna Skeans, MD  valsartan (DIOVAN) 160 MG tablet Take 1 tablet (160 mg total) by mouth daily. 02/09/23   Georganna Skeans, MD  Vitamin D, Ergocalciferol, (DRISDOL) 1.25  MG (50000 UNIT) CAPS capsule Take 1 capsule (50,000 Units total) by mouth every 7 (seven) days. 06/29/23   Georganna Skeans, MD      Allergies    Vicodin [hydrocodone-acetaminophen]    Review of Systems   Review of Systems  Cardiovascular:  Positive for chest pain.  Musculoskeletal:  Positive for back pain.    Physical Exam Updated Vital Signs BP (!) 151/91 (BP Location: Left Arm)   Pulse 74   Temp 98 F (36.7 C) (Oral)   Resp 18   Ht 5\' 3"  (1.6 m)   Wt 136.1 kg   LMP 05/13/2020   SpO2 99%   BMI 53.14 kg/m  Physical Exam Vitals reviewed.  HENT:     Head: Normocephalic.  Cardiovascular:     Rate and Rhythm: Normal rate.     Heart sounds: Normal heart sounds.     No friction rub.  Pulmonary:     Effort: Pulmonary effort is normal.     Breath sounds: No decreased breath sounds.  Chest:     Chest wall: No mass or tenderness.  Neurological:     Mental Status: She is alert.     ED Results / Procedures / Treatments   Labs (all labs ordered are listed, but only abnormal results are displayed) Labs Reviewed  BASIC METABOLIC PANEL - Abnormal; Notable for the following components:      Result Value  Glucose, Bld 118 (*)    All other components within normal limits  CBC  TROPONIN I (HIGH SENSITIVITY)  TROPONIN I (HIGH SENSITIVITY)    EKG EKG Interpretation Date/Time:  Tuesday October 31 2023 08:57:07 EST Ventricular Rate:  68 PR Interval:  156 QRS Duration:  78 QT Interval:  430 QTC Calculation: 457 R Axis:   -17  Text Interpretation: Normal sinus rhythm with sinus arrhythmia Low voltage QRS Cannot rule out Anterior infarct , age undetermined Abnormal ECG When compared with ECG of 02-Sep-2019 20:52, PREVIOUS ECG IS PRESENT Confirmed by Anders Simmonds [phone]) on 10/31/2023 1:06:41 PM  Radiology DG Chest 2 View Result Date: 10/31/2023 CLINICAL DATA:  Chest pain EXAM: CHEST - 2 VIEW COMPARISON:  Chest radiograph 09/02/2019 FINDINGS: The heart size and  mediastinal contours are within normal limits. Both lungs are clear. The visualized skeletal structures are unremarkable. IMPRESSION: No active cardiopulmonary disease. Electronically Signed   By: Annia Belt M.D.   On: 10/31/2023 09:58    Procedures Procedures    Medications Ordered in ED Medications - No data to display  ED Course/ Medical Decision Making/ A&P                                 Medical Decision Making This is a 58 year old female here today for intermittent episodes of chest pain.  Differential diagnoses include angina, musculoskeletal pain, less likely dissection, less likely PE  Plan-on exam, patient overall looks well.  Blood pressure little bit elevated but not concerning Lisa.  Her EKG, per my independent review, shows no ST segment depressions or elevations, no T wave inversions, no evidence of acute ischemia.  Her chest x-ray, per my independent review shows no pneumonia.  Initial troponin 3, with the symptoms ongoing for 1 week, do not believe delta troponin necessary.  Patient story is not consistent with dissection.  Patient with a Wells score of 0.  Heart score 2.  The patient's symptoms may be anginal in nature.  Will provide her with cardiology follow-up.  Amount and/or Complexity of Data Reviewed Labs: ordered. Radiology: ordered.           Final Clinical Impression(s) / ED Diagnoses Final diagnoses:  Chest pain, unspecified type  Angina of effort [facility])    Rx / DC Orders ED Discharge Orders          Ordered    Ambulatory referral to Cardiology       Comments: If you have not heard from the Cardiology office within the next 72 hours please call [phone].   10/31/23 1331              Anders Simmonds T, DO 10/31/23 1335

## 2023-11-23 ENCOUNTER — Other Ambulatory Visit: Payer: Self-pay

## 2023-11-28 ENCOUNTER — Telehealth: Payer: Self-pay | Admitting: Emergency Medicine

## 2023-11-28 ENCOUNTER — Other Ambulatory Visit: Payer: Self-pay

## 2023-11-28 NOTE — Telephone Encounter (Signed)
 I call and spoke with patient and made her aware that the medication should be taken twice a day.  She was concern about weight gain and stated she would talk to MD Andrey Campanile at appointment on 12/11/2023

## 2023-12-11 ENCOUNTER — Ambulatory Visit (INDEPENDENT_AMBULATORY_CARE_PROVIDER_SITE_OTHER): Payer: Self-pay | Admitting: Family Medicine

## 2023-12-11 ENCOUNTER — Encounter: Payer: Self-pay | Admitting: Family Medicine

## 2023-12-11 VITALS — BP 132/83 | HR 74 | Temp 97.9°F | Resp 18 | Ht 63.0 in | Wt 312.0 lb

## 2023-12-11 DIAGNOSIS — E66813 Obesity, class 3: Secondary | ICD-10-CM

## 2023-12-11 DIAGNOSIS — I10 Essential (primary) hypertension: Secondary | ICD-10-CM

## 2023-12-11 DIAGNOSIS — R0789 Other chest pain: Secondary | ICD-10-CM

## 2023-12-11 DIAGNOSIS — R7303 Prediabetes: Secondary | ICD-10-CM

## 2023-12-11 DIAGNOSIS — Z6841 Body Mass Index (BMI) 40.0 and over, adult: Secondary | ICD-10-CM

## 2023-12-11 DIAGNOSIS — F411 Generalized anxiety disorder: Secondary | ICD-10-CM

## 2023-12-11 NOTE — Progress Notes (Unsigned)
 Established Patient Office Visit  Subjective    Patient ID: Carolyn Mcdonald, female    DOB: 1965/09/16  Age: 58 y.o. MRN: [id number]  CC:  Chief Complaint  Patient presents with   Follow-up    Follow up from ED visit, question about medication    HPI Narissa Zachman presents for follow up from recent ED visit. She was seen with anxiety and concern about her BP and having atypical chest pain. She reports that the chest pain has been infrequent.   Outpatient Encounter Medications as of 12/11/2023  Medication Sig   acetaminophen (TYLENOL) 650 MG CR tablet Take 650 mg by mouth every 8 (eight) hours as needed for pain.   carvedilol (COREG) 12.5 MG tablet Take 1 tablet (12.5 mg total) by mouth 2 (two) times daily with a meal.   gabapentin (NEURONTIN) 300 MG capsule Take 1 capsule (300 mg total) by mouth 3 (three) times daily.   Multiple Vitamin (MULTIVITAMIN) tablet Take 1 tablet by mouth daily.   rivaroxaban (XARELTO) 20 MG TABS tablet TAKE 1 TABLET (20 MG TOTAL) BY MOUTH DAILY WITH SUPPER.   spironolactone (ALDACTONE) 50 MG tablet Take 1 tablet (50 mg total) by mouth daily.   triamcinolone cream (KENALOG) 0.1 % Apply 1 Application topically 2 (two) times daily.   valsartan (DIOVAN) 160 MG tablet Take 1 tablet (160 mg total) by mouth daily.   Vitamin D, Ergocalciferol, (DRISDOL) 1.25 MG (50000 UNIT) CAPS capsule Take 1 capsule (50,000 Units total) by mouth every 7 (seven) days.   spironolactone (ALDACTONE) 25 MG tablet Take 1 tablet (25 mg total) by mouth daily. (Patient not taking: Reported on 12/11/2023)   No facility-administered encounter medications on file as of 12/11/2023.    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   Hypertension    Obstructive  sleep apnea 12/23/2020   Prediabetes 01/31/2020   S/P abdominal hysterectomy 07/21/2020   Vitamin D deficiency 01/31/2020    Past Surgical History:  Procedure Laterality Date   ANKLE SURGERY     R ankle    CESAREAN SECTION     x3   HYSTERECTOMY ABDOMINAL WITH SALPINGECTOMY Bilateral 07/21/2020   Procedure: SUPRACERVICAL HYSTERECTOMY  WITH BILATERAL PARTIAL SALPINGECTOMY;  Surgeon: Catalina Antigua, MD;  Location: [facility] OR;  Service: Gynecology;  Laterality: Bilateral;  tap block   SUPRACERVICAL ABDOMINAL HYSTERECTOMY  07/21/2020   TOE SURGERY     R Great toe    TUBAL LIGATION  1993    History reviewed. No pertinent family history.  Social History   Socioeconomic History   Marital status: Married    Spouse name: Not on file   Number of children: Not on file   Years of education: Not on file   Highest education level: Not on file  Occupational History   Not on file  Tobacco Use   Smoking status: Never   Smokeless tobacco: Never  Vaping Use   Vaping status: Never Used  Substance and Sexual Activity   Alcohol use: Never   Drug use: Never   Sexual activity: Not Currently    Birth control/protection: Surgical  Other Topics Concern   Not on file  Social History Narrative   Not on file   Social  Drivers of Health   Financial Resource Strain: Low Risk  (02/02/2023)   Overall Financial Resource Strain (CARDIA)    Difficulty of Paying Living Expenses: Not very hard  Food Insecurity: No Food Insecurity (02/02/2023)   Hunger Vital Sign    Worried About Running Out of Food in the Last Year: Never true    Ran Out of Food in the Last Year: Never true  Transportation Needs: No Transportation Needs (02/02/2023)   PRAPARE - Administrator, Civil Service (Medical): No    Lack of Transportation (Non-Medical): No  Physical Activity: Inactive (02/02/2023)   Exercise Vital Sign    Days of Exercise per Week: 0 days    Minutes of Exercise per Session: 0 min  Stress: No Stress  Concern Present (02/02/2023)   [employer] of Occupational Health - Occupational Stress Questionnaire    Feeling of Stress : Not at all  Social Connections: Socially Integrated (02/02/2023)   Social Connection and Isolation Panel [NHANES]    Frequency of Communication with Friends and Family: More than three times a week    Frequency of Social Gatherings with Friends and Family: Three times a week    Attends Religious Services: More than 4 times per year    Active Member of Clubs or Organizations: Yes    Attends Banker Meetings: Never    Marital Status: Married  Catering manager Violence: Not At Risk (02/02/2023)   Humiliation, Afraid, Rape, and Kick questionnaire    Fear of Current or Ex-Partner: No    Emotionally Abused: No    Physically Abused: No    Sexually Abused: No    Review of Systems  Psychiatric/Behavioral:  The patient is nervous/anxious.   All other systems reviewed and are negative.       Objective    BP 132/83   Pulse 74   Temp 97.9 F (36.6 C) (Oral)   Resp 18   Ht 5\' 3"  (1.6 m)   Wt (!) 312 lb (141.5 kg)   LMP 05/13/2020   SpO2 96%   BMI 55.27 kg/m   Physical Exam Vitals and nursing note reviewed.  Constitutional:      General: She is not in acute distress.    Appearance: She is obese.  Cardiovascular:     Rate and Rhythm: Normal rate and regular rhythm.  Pulmonary:     Effort: Pulmonary effort is normal.     Breath sounds: Normal breath sounds.  Abdominal:     Palpations: Abdomen is soft.     Tenderness: There is no abdominal tenderness.  Neurological:     General: No focal deficit present.     Mental Status: She is alert and oriented to person, place, and time.  Psychiatric:        Mood and Affect: Affect normal. Mood is anxious.         Assessment & Plan:  1. Essential hypertension (Primary) Appears stable. Continue   2. Atypical chest pain Patient already scheduled with consultant for further eval/mgt  3.  Anxiety state Patient to consider med to aid with sx.   4. Prediabetes Discussed dietary and activity options.   5. Class 3 severe obesity due to excess calories with serious comorbidity and body mass index (BMI) of 50.0 to 59.9 in adult [facility])     No follow-ups on file.   Tommie Raymond, MD

## 2023-12-14 ENCOUNTER — Encounter: Payer: Self-pay | Admitting: Family Medicine

## 2023-12-26 ENCOUNTER — Ambulatory Visit: Payer: Self-pay | Attending: Cardiovascular Disease | Admitting: Cardiovascular Disease

## 2023-12-26 ENCOUNTER — Encounter: Payer: Self-pay | Admitting: Cardiovascular Disease

## 2023-12-26 ENCOUNTER — Other Ambulatory Visit: Payer: Self-pay

## 2023-12-26 VITALS — BP 128/80 | HR 74 | Ht 63.0 in | Wt 316.6 lb

## 2023-12-26 DIAGNOSIS — E66813 Obesity, class 3: Secondary | ICD-10-CM

## 2023-12-26 DIAGNOSIS — R0609 Other forms of dyspnea: Secondary | ICD-10-CM

## 2023-12-26 DIAGNOSIS — R072 Precordial pain: Secondary | ICD-10-CM

## 2023-12-26 DIAGNOSIS — R0600 Dyspnea, unspecified: Secondary | ICD-10-CM | POA: Insufficient documentation

## 2023-12-26 DIAGNOSIS — R079 Chest pain, unspecified: Secondary | ICD-10-CM

## 2023-12-26 DIAGNOSIS — Z6841 Body Mass Index (BMI) 40.0 and over, adult: Secondary | ICD-10-CM

## 2023-12-26 DIAGNOSIS — I10 Essential (primary) hypertension: Secondary | ICD-10-CM

## 2023-12-26 MED ORDER — METOPROLOL TARTRATE 100 MG PO TABS
100.0000 mg | ORAL_TABLET | Freq: Once | ORAL | 0 refills | Status: DC
  Filled 2023-12-26: qty 1, 1d supply, fill #0

## 2023-12-26 NOTE — Patient Instructions (Addendum)
 Medication Instructions:  Your physician recommends that you continue on your current medications as directed. Please refer to the Current Medication list given to you today.  *If you need a refill on your cardiac medications before your next appointment, please call your pharmacy*  Lab Work: TODAY: BMET  Testing/Procedures: Your physician has requested that you have an echocardiogram. Echocardiography is a painless test that uses sound waves to create images of your heart. It provides your doctor with information about the size and shape of your heart and how well your heart's chambers and valves are working. This procedure takes approximately one hour. There are no restrictions for this procedure. Please do NOT wear cologne, perfume, aftershave, or lotions (deodorant is allowed). Please arrive 15 minutes prior to your appointment time.  Please note: We ask at that you not bring children with you during ultrasound (echo/ vascular) testing. Due to room size and safety concerns, children are not allowed in the ultrasound rooms during exams. Our front office staff cannot provide observation of children in our lobby area while testing is being conducted. An adult accompanying a patient to their appointment will only be allowed in the ultrasound room at the discretion of the ultrasound technician under special circumstances. We apologize for any inconvenience.  Your physician has requested that you have cardiac CT. Cardiac computed tomography (CT) is a painless test that uses an x-ray machine to take clear, detailed pictures of your heart. For further information please visit [email or web address] Please follow instruction sheet as given.  Follow-Up: At [facility], you and your health needs are our priority.  As part of our continuing mission to provide you with exceptional heart care, we have created designated Provider Care Teams.  These Care Teams include your primary Cardiologist (physician) and  Advanced Practice Providers (APPs -  Physician Assistants and Nurse Practitioners) who all work together to provide you with the care you need, when you need it.  Your next appointment:   6 month(s)  The format for your next appointment:   In Person  Provider:   Kristeen Miss, MD      Your cardiac CT will be scheduled at one of the below locations:   [facility] [address] [place], [place] [address] [phone]   Saul Fordyce. [facility] and Vascular Tower [address]  [place], [place] [address] Opening January 08, 2024  If scheduled at [facility], please arrive at the [facility] and Children's Entrance (Entrance C2) of [facility] 30 minutes prior to test start time. You can use the FREE valet parking offered at entrance C (encouraged to control the heart rate for the test)  Proceed to the [facility] Radiology Department (first floor) to check-in and test prep.  All radiology patients and guests should use entrance C2 at [facility], accessed from [facility], even though the hospital's physical address listed is [address]     Please follow these instructions carefully (unless otherwise directed):  An IV will be required for this test and Nitroglycerin will be given.   On the Night Before the Test: Be sure to Drink plenty of water. Do not consume any caffeinated/decaffeinated beverages or chocolate 12 hours prior to your test.  On the Day of the Test: Drink plenty of water until 1 hour prior to the test. Do not eat any food 1 hour prior to test. You may take your regular medications prior to the test.  Take metoprolol (Lopressor)  two hours prior to test instead of the carvedilol that morning. FEMALES- please wear underwire-free bra if available, avoid dresses & tight clothing  After the Test: Drink plenty of water. After receiving IV contrast, you may experience a mild flushed feeling. This is  normal. On occasion, you may experience a mild rash up to 24 hours after the test. This is not dangerous. If this occurs, you can take Benadryl 25 mg, Zyrtec, Claritin, or Allegra and increase your fluid intake. (Patients taking Tikosyn should avoid Benadryl, and may take Zyrtec, Claritin, or Allegra) If you experience trouble breathing, this can be serious. If it is severe call [phone] IMMEDIATELY. If it is mild, please call our office.  We will call to schedule your test 2-4 weeks out understanding that some insurance companies will need an authorization prior to the service being performed.   For more information and frequently asked questions, please visit our website : [email or web address]  For non-scheduling related questions, please contact the cardiac imaging nurse navigator should you have any questions/concerns: Cardiac Imaging Nurse Navigators Direct Office Dial: [phone]   For scheduling needs, including cancellations and rescheduling, please call [place], [phone].      1st Floor: - Lobby - Registration  - Pharmacy  - Lab - Cafe  2nd Floor: - PV Lab - Diagnostic Testing (echo, CT, nuclear med)  3rd Floor: - Vacant  4th Floor: - TCTS (cardiothoracic surgery) - AFib Clinic - Structural Heart Clinic - Vascular Surgery  - Vascular Ultrasound  5th Floor: - HeartCare Cardiology (general and EP) - Clinical Pharmacy for coumadin, hypertension, lipid, weight-loss medications, and med management appointments    Valet parking services will be available as well.

## 2023-12-26 NOTE — Progress Notes (Signed)
  Cardiology Office Note:  .   Date:  12/26/2023  ID:  Carolyn Mcdonald, DOB August 22, 1966, MRN [id number] PCP: Abraham Abo, MD  [facility] Providers Cardiologist:  None    History of Present Illness: .   Carolyn Mcdonald I ( [place] -[place]) s a 58 y.o. female with chest pain   Went to the ER with dizziness, nauseated, disoriented  Was found to a normal Troponin level   Had right sided chest pressure  Pressure was off and on ,  might last several seconds  Not worsened with deep breath, not worsened with exercise  Has been  trying to exercise more   Non smoker   Drives a school bus   Fam Hx  Mother had CHF , in her 36 Father had DM Grandmother ( maternal ) - heart disease  Mat. Aunt  had CHF    ROS:   Studies Reviewed: .         Risk Assessment/Calculations:             Physical Exam:   VS:  BP 128/80   Pulse 74   Ht 5\' 3"  (1.6 m)   Wt (!) 316 lb 9.6 oz (143.6 kg)   LMP 05/13/2020   SpO2 97%   BMI 56.08 kg/m    Wt Readings from Last 3 Encounters:  12/26/23 (!) 316 lb 9.6 oz (143.6 kg)  12/11/23 (!) 312 lb (141.5 kg)  10/31/23 300 lb (136.1 kg)    GEN: Well nourished, well developed in no acute distress NECK: No JVD; No carotid bruits CARDIAC: RRR, no murmurs, rubs, gallops RESPIRATORY:  Clear to auscultation without rales, wheezing or rhonchi  ABDOMEN: Soft, non-tender, non-distended EXTREMITIES:  No edema; No deformity   ASSESSMENT AND PLAN: .     Chest pain :   + fam hx of CAD.   Had an ER visit last month Will get a coronary CTA  We will set up a 73-month follow-up appointment to review her echocardiogram and coronary CTA.  If these studies are negative then we will give her the option to cancel that appointment and just follow-up with her medical doctor.  I suspect weight loss will definitely help with her symptoms.  2.  Dyspnea with exertion:  will get echo to further evalutate her DOE. Her morbid obesity is likely playing a role in her dyspnea.    3. Morbid obesity:   advised weight loss        Dispo:   Signed, Ahmad Alert, MD

## 2023-12-27 ENCOUNTER — Other Ambulatory Visit: Payer: Self-pay | Admitting: Cardiovascular Disease

## 2023-12-27 LAB — BASIC METABOLIC PANEL WITH GFR
BUN/Creatinine Ratio: 19 (ref 9–23)
BUN: 13 mg/dL (ref 6–24)
CO2: 23 mmol/L (ref 20–29)
Calcium: 9.1 mg/dL (ref 8.7–10.2)
Chloride: 105 mmol/L (ref 96–106)
Creatinine, Ser: 0.7 mg/dL (ref 0.57–1.00)
Glucose: 82 mg/dL (ref 70–99)
Potassium: 4.4 mmol/L (ref 3.5–5.2)
Sodium: 142 mmol/L (ref 134–144)
eGFR: 100 mL/min/{1.73_m2} (ref >59)

## 2024-01-01 ENCOUNTER — Encounter: Payer: Self-pay | Admitting: Cardiovascular Disease

## 2024-01-04 ENCOUNTER — Other Ambulatory Visit: Payer: Self-pay

## 2024-01-05 ENCOUNTER — Other Ambulatory Visit: Payer: Self-pay

## 2024-01-05 ENCOUNTER — Ambulatory Visit (HOSPITAL_COMMUNITY)
Admission: EM | Admit: 2024-01-05 | Discharge: 2024-01-05 | Disposition: A | Discharge status: Home / Self Care | Attending: Family Medicine | Admitting: Family Medicine

## 2024-01-05 ENCOUNTER — Encounter (HOSPITAL_COMMUNITY): Payer: Self-pay

## 2024-01-05 DIAGNOSIS — J069 Acute upper respiratory infection, unspecified: Secondary | ICD-10-CM | POA: Diagnosis not present

## 2024-01-05 DIAGNOSIS — H66003 Acute suppurative otitis media without spontaneous rupture of ear drum, bilateral: Secondary | ICD-10-CM | POA: Diagnosis not present

## 2024-01-05 DIAGNOSIS — J029 Acute pharyngitis, unspecified: Secondary | ICD-10-CM

## 2024-01-05 LAB — POCT RAPID STREP A (OFFICE): Rapid Strep A Screen: NEGATIVE

## 2024-01-05 MED ORDER — FLUTICASONE PROPIONATE 50 MCG/ACT NA SUSP
1.0000 | Freq: Two times a day (BID) | NASAL | 0 refills | Status: AC | PRN
  Filled 2024-01-05: qty 16, 30d supply, fill #0

## 2024-01-05 MED ORDER — CEFDINIR 300 MG PO CAPS
300.0000 mg | ORAL_CAPSULE | Freq: Two times a day (BID) | ORAL | 0 refills | Status: AC
  Filled 2024-01-05: qty 14, 7d supply, fill #0

## 2024-01-05 NOTE — ED Provider Notes (Signed)
 [facility]    CSN: [id number] Arrival date & time: 01/05/24  4098      History   Chief Complaint Chief Complaint  Patient presents with   Sore Throat   Cough    HPI Carolyn Mcdonald is a 58 y.o. female.   Patient reports that she has had a sore throat, runny nose, facial pain, sinus pressure with dark yellowish sputum since 12/29/2023.  She has had some ear fullness and pressure.  She has used OTC Benadryl  and ibuprofen  with some relief.  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   Sore Throat Pertinent negatives include no chest pain, no abdominal pain and no shortness of breath.  Cough Associated symptoms: ear pain, rhinorrhea and sore throat   Associated symptoms: no chest pain, no chills, no fever, no rash and no shortness of breath     Past Medical History:  Diagnosis Date   Class 3 severe obesity due to excess calories with serious comorbidity and body mass index (BMI) of 50.0 to 59.9 in adult [facility]) 01/30/2020   DVT (deep venous thrombosis) (HCC)    Fibroid    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   Hypertension    Obstructive sleep apnea 12/23/2020   Prediabetes 01/31/2020   S/P abdominal hysterectomy 07/21/2020   Vitamin D  deficiency 01/31/2020    Patient Active Problem List   Diagnosis Date Noted   Chest pain of uncertain etiology 12/26/2023   Dyspnea 12/26/2023   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   Obstructive sleep apnea 12/23/2020   S/P abdominal hysterectomy 07/21/2020   Prediabetes 01/31/2020   Vitamin D  deficiency 01/31/2020   Essential hypertension 01/30/2020   Class 3 severe obesity due to excess calories with serious comorbidity and body mass index (BMI) of 50.0 to 59.9 in adult [facility]) 01/30/2020    Past Surgical History:  Procedure Laterality Date   ANKLE SURGERY     R ankle    CESAREAN SECTION     x3    HYSTERECTOMY ABDOMINAL WITH SALPINGECTOMY Bilateral 07/21/2020   Procedure: SUPRACERVICAL HYSTERECTOMY  WITH BILATERAL PARTIAL SALPINGECTOMY;  Surgeon: Verlyn Goad, MD;  Location: [facility] OR;  Service: Gynecology;  Laterality: Bilateral;  tap block   SUPRACERVICAL ABDOMINAL HYSTERECTOMY  07/21/2020   TOE SURGERY     R Great toe    TUBAL LIGATION  1993    OB History     Gravida  3   Para      Term      Preterm      AB      Living         SAB      IAB      Ectopic      Multiple      Live Births  3            Home Medications    Prior to Admission medications   Medication Sig Start Date End Date Taking? Authorizing Provider  cefdinir  (OMNICEF ) 300 MG capsule Take 1 capsule (300 mg total) by mouth 2 (two) times daily for 7 days. 01/05/24 01/12/24 Yes Guss Legacy, FNP  fluticasone  (FLONASE ) 50 MCG/ACT nasal spray Place 1 spray into both nostrils 2 (two) times daily as needed for rhinitis. 01/05/24 02/04/24 Yes Guss Legacy, FNP  acetaminophen  (TYLENOL ) 650 MG CR tablet Take 650  mg by mouth every 8 (eight) hours as needed for pain.    [provider]  carvedilol  (COREG ) 12.5 MG tablet Take 1 tablet (12.5 mg total) by mouth 2 (two) times daily with a meal. 04/11/23   Abraham Abo, MD  gabapentin  (NEURONTIN ) 300 MG capsule Take 1 capsule (300 mg total) by mouth 3 (three) times daily. 06/24/22   Abraham Abo, MD  Multiple Vitamin (MULTIVITAMIN) tablet Take 1 tablet by mouth daily.    [provider]  rivaroxaban  (XARELTO ) 20 MG TABS tablet TAKE 1 TABLET (20 MG TOTAL) BY MOUTH DAILY WITH SUPPER. 06/21/23   Abraham Abo, MD  spironolactone  (ALDACTONE ) 50 MG tablet Take 1 tablet (50 mg total) by mouth daily. 06/21/23   Abraham Abo, MD  valsartan  (DIOVAN ) 160 MG tablet Take 1 tablet (160 mg total) by mouth daily. 02/09/23   Abraham Abo, MD  Vitamin D , Ergocalciferol , (DRISDOL ) 1.25 MG (50000 UNIT) CAPS capsule Take 1 capsule (50,000 Units total) by  mouth every 7 (seven) days. 06/29/23   Abraham Abo, MD    Family History History reviewed. No pertinent family history.  Social History Social History   Tobacco Use   Smoking status: Never   Smokeless tobacco: Never  Vaping Use   Vaping status: Never Used  Substance Use Topics   Alcohol use: Never   Drug use: Never     Allergies   Vicodin [hydrocodone-acetaminophen ]   Review of Systems Review of Systems  Constitutional:  Negative for chills and fever.  HENT:  Positive for ear pain, postnasal drip, rhinorrhea, sinus pressure and sore throat.   Eyes:  Negative for pain and visual disturbance.  Respiratory:  Positive for cough. Negative for shortness of breath.   Cardiovascular:  Negative for chest pain and palpitations.  Gastrointestinal:  Negative for abdominal pain, constipation, diarrhea, nausea and vomiting.  Genitourinary:  Negative for dysuria and hematuria.  Musculoskeletal:  Negative for arthralgias and back pain.  Skin:  Negative for color change and rash.  Neurological:  Negative for seizures and syncope.  All other systems reviewed and are negative.    Physical Exam Triage Vital Signs ED Triage Vitals [01/05/24 1043]  Encounter Vitals Group     BP (!) 137/90     Systolic BP Percentile      Diastolic BP Percentile      Pulse Rate 70     Resp 16     Temp 97.9 F (36.6 C)     Temp Source Oral     SpO2 98 %     Weight      Height      Head Circumference      Peak Flow      Pain Score 6     Pain Loc      Pain Education      Exclude from Growth Chart    No data found.  Updated Vital Signs BP (!) 137/90 (BP Location: Right Arm)   Pulse 70   Temp 97.9 F (36.6 C) (Oral)   Resp 16   LMP 05/13/2020   SpO2 98%   Visual Acuity Right Eye Distance:   Left Eye Distance:   Bilateral Distance:    Right Eye Near:   Left Eye Near:    Bilateral Near:     Physical Exam Vitals and nursing note reviewed.  Constitutional:      General: She is  not in acute distress.    Appearance: She is well-developed. She is not ill-appearing  or toxic-appearing.  HENT:     Head: Normocephalic and atraumatic.     Right Ear: Hearing, ear canal and external ear normal. A middle ear effusion is present. Tympanic membrane is erythematous.     Left Ear: Hearing, ear canal and external ear normal. A middle ear effusion is present. Tympanic membrane is erythematous.     Nose: Congestion and rhinorrhea present. Rhinorrhea is clear.     Right Sinus: Maxillary sinus tenderness (Mild pressure not pain) present. No frontal sinus tenderness.     Left Sinus: Maxillary sinus tenderness (Mild pressure not pain) present. No frontal sinus tenderness.     Mouth/Throat:     Lips: Pink.     Mouth: Mucous membranes are moist.     Pharynx: Uvula midline. Posterior oropharyngeal erythema present. No oropharyngeal exudate.     Tonsils: No tonsillar exudate (Erythema and enlargement of tonsils but no exudate).  Eyes:     Conjunctiva/sclera: Conjunctivae normal.     Pupils: Pupils are equal, round, and reactive to light.  Cardiovascular:     Rate and Rhythm: Normal rate and regular rhythm.     Heart sounds: S1 normal and S2 normal. No murmur heard. Pulmonary:     Effort: Pulmonary effort is normal. No respiratory distress.     Breath sounds: Normal breath sounds. No decreased breath sounds, wheezing, rhonchi or rales.  Abdominal:     General: Bowel sounds are normal.     Palpations: Abdomen is soft.     Tenderness: There is no abdominal tenderness.  Musculoskeletal:        General: No swelling.     Cervical back: Neck supple.  Lymphadenopathy:     Head:     Right side of head: Tonsillar adenopathy present. No submental, submandibular, preauricular or posterior auricular adenopathy.     Left side of head: Tonsillar adenopathy present. No submental, submandibular, preauricular or posterior auricular adenopathy.     Cervical: No cervical adenopathy.     Right  cervical: No superficial cervical adenopathy.    Left cervical: No superficial cervical adenopathy.  Skin:    General: Skin is warm and dry.     Capillary Refill: Capillary refill takes less than 2 seconds.     Findings: No rash.  Neurological:     Mental Status: She is alert and oriented to person, place, and time.  Psychiatric:        Mood and Affect: Mood normal.      UC Treatments / Results  Labs (all labs ordered are listed, but only abnormal results are displayed) Labs Reviewed  POCT RAPID STREP A (OFFICE) - Normal    EKG   Radiology No results found.  Procedures Procedures (including critical care time)  Medications Ordered in UC Medications - No data to display  Initial Impression / Assessment and Plan / UC Course  I have reviewed the triage vital signs and the nursing notes.  Pertinent labs & imaging results that were available during my care of the patient were reviewed by me and considered in my medical decision making (see chart for details).     Plan of Care: Viral upper respiratory infection with cough: Fluticasone  nasal spray, 1 spray into each nostril once or twice daily for nasal congestion.  Encouraged use of sinus rinses.  This is a viral upper respiratory infection and antibiotics are not needed for this Bilateral otitis media: Cefdinir  300 mg twice daily for 7 days.  Encouraged recheck of ears in 3  weeks.  Work excuse provided.  Follow-up if symptoms do not improve, worsen or new symptoms occur. Final Clinical Impressions(s) / UC Diagnoses   Final diagnoses:  Sore throat  Non-recurrent acute suppurative otitis media of both ears without spontaneous rupture of tympanic membranes  Viral URI with cough     Discharge Instructions      Rapid strep was negative: Exam shows signs of viral upper respiratory infection and ear infection in both ears.  Encouraged fluticasone  nasal spray, 1 spray into each nostril once daily for congestion and upper  respiratory infection.  Cefdinir , 300 mg, 1 twice daily for 7 days for ear infections.  Get plenty of fluids and rest.  Take Tylenol  or Motrin  if needed for ear pain.  Follow-up if symptoms do not resolve, worsen or new symptoms occur.  Encouraged to get a recheck of ears in 2 to 3 weeks to ensure that ear infections resolved.     ED Prescriptions     Medication Sig Dispense Auth. Provider   cefdinir  (OMNICEF ) 300 MG capsule Take 1 capsule (300 mg total) by mouth 2 (two) times daily for 7 days. 14 capsule Guss Legacy, FNP   fluticasone  (FLONASE ) 50 MCG/ACT nasal spray Place 1 spray into both nostrils 2 (two) times daily as needed for rhinitis. 16 g Guss Legacy, FNP      PDMP not reviewed this encounter.   Guss Legacy, FNP 01/05/24 1204

## 2024-01-05 NOTE — ED Triage Notes (Signed)
 Patient  reports that she has had a sore throat and a productive cough with yellow sputum x 1 week.  Patient reports that she has been taking Benadryl  and ibuprofen .

## 2024-01-05 NOTE — Discharge Instructions (Addendum)
 Rapid strep was negative: Exam shows signs of viral upper respiratory infection and ear infection in both ears.  Encouraged fluticasone  nasal spray, 1 spray into each nostril once daily for congestion and upper respiratory infection.  Cefdinir , 300 mg, 1 twice daily for 7 days for ear infections.  Get plenty of fluids and rest.  Take Tylenol  or Motrin  if needed for ear pain.  Follow-up if symptoms do not resolve, worsen or new symptoms occur.  Encouraged to get a recheck of ears in 2 to 3 weeks to ensure that ear infections resolved.

## 2024-01-09 ENCOUNTER — Encounter (HOSPITAL_COMMUNITY): Payer: Self-pay

## 2024-01-09 ENCOUNTER — Other Ambulatory Visit: Payer: Self-pay | Admitting: Family Medicine

## 2024-01-11 ENCOUNTER — Ambulatory Visit (HOSPITAL_COMMUNITY)
Admission: RE | Admit: 2024-01-11 | Discharge: 2024-01-11 | Disposition: A | Discharge status: Home / Self Care | Source: Ambulatory Visit | Attending: Cardiovascular Disease | Admitting: Cardiovascular Disease

## 2024-01-11 DIAGNOSIS — R0609 Other forms of dyspnea: Secondary | ICD-10-CM | POA: Insufficient documentation

## 2024-01-11 DIAGNOSIS — R072 Precordial pain: Secondary | ICD-10-CM | POA: Diagnosis not present

## 2024-01-11 MED ORDER — IOHEXOL 350 MG/ML SOLN
100.0000 mL | Freq: Once | INTRAVENOUS | Status: AC | PRN
  Administered 2024-01-11: 100 mL via INTRAVENOUS

## 2024-01-11 MED ORDER — NITROGLYCERIN 0.4 MG SL SUBL
0.8000 mg | SUBLINGUAL_TABLET | Freq: Once | SUBLINGUAL | Status: AC
Start: 2024-01-11 — End: 2024-01-11
  Administered 2024-01-11: 0.8 mg via SUBLINGUAL

## 2024-01-11 MED ORDER — NITROGLYCERIN 0.4 MG SL SUBL
SUBLINGUAL_TABLET | SUBLINGUAL | Status: AC
  Filled 2024-01-11: qty 2

## 2024-01-12 ENCOUNTER — Other Ambulatory Visit: Payer: Self-pay

## 2024-01-12 ENCOUNTER — Encounter: Payer: Self-pay | Admitting: Cardiovascular Disease

## 2024-01-15 ENCOUNTER — Other Ambulatory Visit: Payer: Self-pay | Admitting: Family Medicine

## 2024-01-15 DIAGNOSIS — Z86718 Personal history of other venous thrombosis and embolism: Secondary | ICD-10-CM

## 2024-01-16 ENCOUNTER — Other Ambulatory Visit (HOSPITAL_COMMUNITY): Payer: Self-pay

## 2024-01-16 ENCOUNTER — Other Ambulatory Visit: Payer: Self-pay | Admitting: *Deleted

## 2024-01-16 ENCOUNTER — Encounter (HOSPITAL_COMMUNITY): Payer: Self-pay

## 2024-01-16 ENCOUNTER — Telehealth: Payer: Self-pay | Admitting: Family Medicine

## 2024-01-16 NOTE — Telephone Encounter (Signed)
 Copied from CRM [id number]. Topic: Clinical - Prescription Issue >> Jan 16, 2024  9:27 AM Hassie Lint wrote: Reason for CRM: Patient sent request for prescriptions refills of valsartan  (DIOVAN ) 160 MG tablet and spironolactone  (ALDACTONE ) 50 MG tablet on 01/09/24. Has not gotten a response about the refill being complete. Patient is currently out of both medications.  Patient can be reached at [phone]

## 2024-01-16 NOTE — Telephone Encounter (Signed)
 Refill sent to pcp

## 2024-01-17 ENCOUNTER — Other Ambulatory Visit: Payer: Self-pay

## 2024-01-17 MED ORDER — VALSARTAN 160 MG PO TABS
160.0000 mg | ORAL_TABLET | Freq: Every day | ORAL | 1 refills | Status: DC
  Filled 2024-01-17: qty 90, 90d supply, fill #0
  Filled 2024-04-29: qty 90, 90d supply, fill #1

## 2024-01-18 ENCOUNTER — Telehealth: Payer: Self-pay

## 2024-01-18 ENCOUNTER — Other Ambulatory Visit: Payer: Self-pay

## 2024-01-18 ENCOUNTER — Other Ambulatory Visit: Payer: Self-pay | Admitting: Family Medicine

## 2024-01-18 DIAGNOSIS — I2510 Atherosclerotic heart disease of native coronary artery without angina pectoris: Secondary | ICD-10-CM

## 2024-01-18 DIAGNOSIS — Z86718 Personal history of other venous thrombosis and embolism: Secondary | ICD-10-CM

## 2024-01-18 DIAGNOSIS — E782 Mixed hyperlipidemia: Secondary | ICD-10-CM

## 2024-01-18 DIAGNOSIS — Z79899 Other long term (current) drug therapy: Secondary | ICD-10-CM

## 2024-01-18 MED ORDER — ROSUVASTATIN CALCIUM 20 MG PO TABS
20.0000 mg | ORAL_TABLET | Freq: Every day | ORAL | 3 refills | Status: AC
  Filled 2024-01-18: qty 90, 90d supply, fill #0
  Filled 2024-04-22: qty 90, 90d supply, fill #1
  Filled 2024-08-04: qty 90, 90d supply, fill #2

## 2024-01-18 NOTE — Telephone Encounter (Signed)
-----   Message from Ahmad Alert sent at 01/12/2024  9:40 AM EDT ----- CAC score is 134 ( 94th percentile for age / sex matched controls  Non obstructive CAD  Her last LDL from Oct. 2024 was 130  Her goal LDL is < 70 Add rosuvastatin 20 mg a day  Cont to work on improved diet, exercise, weight loss  Check Lipids, ALT, BMP in 3 months

## 2024-01-18 NOTE — Telephone Encounter (Signed)
 Called and spoke with patient about results and recommendations. Agrees to plan. Crestor sent to pharmacy requested, labs entered and released so she can have drawn in 3 months.

## 2024-01-19 ENCOUNTER — Other Ambulatory Visit: Payer: Self-pay | Admitting: Family Medicine

## 2024-01-19 ENCOUNTER — Other Ambulatory Visit: Payer: Self-pay

## 2024-01-19 DIAGNOSIS — Z86718 Personal history of other venous thrombosis and embolism: Secondary | ICD-10-CM

## 2024-01-19 MED ORDER — SPIRONOLACTONE 50 MG PO TABS
50.0000 mg | ORAL_TABLET | Freq: Every day | ORAL | 1 refills | Status: DC
  Filled 2024-01-19: qty 90, 90d supply, fill #0
  Filled 2024-04-29: qty 90, 90d supply, fill #1

## 2024-01-19 MED ORDER — RIVAROXABAN 20 MG PO TABS
20.0000 mg | ORAL_TABLET | Freq: Every day | ORAL | 1 refills | Status: DC
  Filled 2024-01-19: qty 90, 90d supply, fill #0
  Filled 2024-04-30: qty 90, 90d supply, fill #1

## 2024-01-22 ENCOUNTER — Other Ambulatory Visit: Payer: Self-pay

## 2024-02-02 ENCOUNTER — Ambulatory Visit (HOSPITAL_COMMUNITY): Attending: Cardiology

## 2024-02-05 ENCOUNTER — Other Ambulatory Visit: Payer: Self-pay | Admitting: Family Medicine

## 2024-02-06 ENCOUNTER — Encounter (HOSPITAL_COMMUNITY): Payer: Self-pay | Admitting: Cardiovascular Disease

## 2024-02-07 ENCOUNTER — Other Ambulatory Visit: Payer: Self-pay

## 2024-02-09 ENCOUNTER — Encounter: Payer: Self-pay | Admitting: Family Medicine

## 2024-02-09 ENCOUNTER — Ambulatory Visit: Payer: Self-pay | Admitting: Family Medicine

## 2024-02-09 ENCOUNTER — Other Ambulatory Visit: Payer: Self-pay

## 2024-02-09 VITALS — BP 142/78 | HR 63 | Wt 317.0 lb

## 2024-02-09 DIAGNOSIS — J309 Allergic rhinitis, unspecified: Secondary | ICD-10-CM

## 2024-02-09 DIAGNOSIS — E66813 Obesity, class 3: Secondary | ICD-10-CM

## 2024-02-09 DIAGNOSIS — R7303 Prediabetes: Secondary | ICD-10-CM | POA: Diagnosis not present

## 2024-02-09 DIAGNOSIS — I10 Essential (primary) hypertension: Secondary | ICD-10-CM

## 2024-02-09 DIAGNOSIS — Z6841 Body Mass Index (BMI) 40.0 and over, adult: Secondary | ICD-10-CM

## 2024-02-09 DIAGNOSIS — E559 Vitamin D deficiency, unspecified: Secondary | ICD-10-CM

## 2024-02-09 MED ORDER — CARVEDILOL 12.5 MG PO TABS
12.5000 mg | ORAL_TABLET | Freq: Two times a day (BID) | ORAL | 1 refills | Status: AC
  Filled 2024-02-09 – 2024-05-25 (×2): qty 180, 90d supply, fill #0
  Filled 2024-09-02: qty 180, 90d supply, fill #1

## 2024-02-09 NOTE — Progress Notes (Signed)
 Established Patient Office Visit  Subjective    Patient ID: Carolyn Mcdonald, female    DOB: Oct 28, 1965  Age: 58 y.o. MRN: [id number]  CC:  Chief Complaint  Patient presents with   Medical Management of Chronic Issues   Medication Refill    HPI Carolyn Mcdonald presents for routine follow up of hypertension and vitamin d  deficiency. Patient also reports seasonal allergies that are causing congestion.   Outpatient Encounter Medications as of 02/09/2024  Medication Sig   acetaminophen  (TYLENOL ) 650 MG CR tablet Take 650 mg by mouth every 8 (eight) hours as needed for pain.   gabapentin  (NEURONTIN ) 300 MG capsule Take 1 capsule (300 mg total) by mouth 3 (three) times daily.   Multiple Vitamin (MULTIVITAMIN) tablet Take 1 tablet by mouth daily.   rivaroxaban  (XARELTO ) 20 MG TABS tablet TAKE 1 TABLET (20 MG TOTAL) BY MOUTH DAILY WITH SUPPER.   rosuvastatin  (CRESTOR ) 20 MG tablet Take 1 tablet (20 mg total) by mouth daily.   spironolactone  (ALDACTONE ) 50 MG tablet Take 1 tablet (50 mg total) by mouth daily.   valsartan  (DIOVAN ) 160 MG tablet Take 1 tablet (160 mg total) by mouth daily.   Vitamin D , Ergocalciferol , (DRISDOL ) 1.25 MG (50000 UNIT) CAPS capsule Take 1 capsule (50,000 Units total) by mouth every 7 (seven) days.   [DISCONTINUED] carvedilol  (COREG ) 12.5 MG tablet Take 1 tablet (12.5 mg total) by mouth 2 (two) times daily with a meal.   carvedilol  (COREG ) 12.5 MG tablet Take 1 tablet (12.5 mg total) by mouth 2 (two) times daily with a meal.   fluticasone  (FLONASE ) 50 MCG/ACT nasal spray Place 1 spray into both nostrils 2 (two) times daily as needed for rhinitis.   No facility-administered encounter medications on file as of 02/09/2024.    Past Medical History:  Diagnosis Date   Class 3 severe obesity due to excess calories with serious comorbidity and body mass index (BMI) of 50.0 to 59.9 in adult 01/30/2020   DVT (deep venous thrombosis) (HCC)    Fibroid    History of DVT of  lower extremity 04/20/2021   Hypertension    Obstructive sleep apnea 12/23/2020   Prediabetes 01/31/2020   S/P abdominal hysterectomy 07/21/2020   Vitamin D  deficiency 01/31/2020    Past Surgical History:  Procedure Laterality Date   ANKLE SURGERY     R ankle    CESAREAN SECTION     x3   HYSTERECTOMY ABDOMINAL WITH SALPINGECTOMY Bilateral 07/21/2020   Procedure: SUPRACERVICAL HYSTERECTOMY  WITH BILATERAL PARTIAL SALPINGECTOMY;  Surgeon: Verlyn Goad, MD;  Location: [facility];  Service: Gynecology;  Laterality: Bilateral;  tap block   SUPRACERVICAL ABDOMINAL HYSTERECTOMY  07/21/2020   TOE SURGERY     R Great toe    TUBAL LIGATION  1993    No family history on file.  Social History   Socioeconomic History   Marital status: Married    Spouse name: Not on file   Number of children: Not on file   Years of education: Not on file   Highest education level: Not on file  Occupational History   Not on file  Tobacco Use   Smoking status: Never   Smokeless tobacco: Never  Vaping Use   Vaping status: Never Used  Substance and Sexual Activity   Alcohol use: Never   Drug use: Never   Sexual activity: Not Currently    Birth control/protection: Surgical  Other Topics Concern   Not on file  Social History Narrative  Not on file   Social Drivers of Health   Financial Resource Strain: Low Risk  (02/02/2023)   Overall Financial Resource Strain (CARDIA)    Difficulty of Paying Living Expenses: Not very hard  Food Insecurity: No Food Insecurity (02/02/2023)   Hunger Vital Sign    Worried About Running Out of Food in the Last Year: Never true    Ran Out of Food in the Last Year: Never true  Transportation Needs: No Transportation Needs (02/02/2023)   PRAPARE - Administrator, Civil Service (Medical): No    Lack of Transportation (Non-Medical): No  Physical Activity: Inactive (02/02/2023)   Exercise Vital Sign    Days of Exercise per Week: 0 days    Minutes of Exercise  per Session: 0 min  Stress: No Stress Concern Present (02/02/2023)   [employer] of Occupational Health - Occupational Stress Questionnaire    Feeling of Stress : Not at all  Social Connections: Socially Integrated (02/02/2023)   Social Connection and Isolation Panel [NHANES]    Frequency of Communication with Friends and Family: More than three times a week    Frequency of Social Gatherings with Friends and Family: Three times a week    Attends Religious Services: More than 4 times per year    Active Member of Clubs or Organizations: Yes    Attends Banker Meetings: Never    Marital Status: Married  Catering manager Violence: Not At Risk (02/02/2023)   Humiliation, Afraid, Rape, and Kick questionnaire    Fear of Current or Ex-Partner: No    Emotionally Abused: No    Physically Abused: No    Sexually Abused: No    Review of Systems  Endo/Heme/Allergies:  Positive for environmental allergies.  All other systems reviewed and are negative.       Objective    BP (!) 142/78 (BP Location: Right Arm, Patient Position: Sitting, Cuff Size: Large)   Pulse 63   Wt (!) 317 lb (143.8 kg)   LMP 05/13/2020   SpO2 97%   BMI 56.15 kg/m   Physical Exam Vitals and nursing note reviewed.  Constitutional:      General: She is not in acute distress. Cardiovascular:     Rate and Rhythm: Normal rate and regular rhythm.  Pulmonary:     Effort: Pulmonary effort is normal.     Breath sounds: Normal breath sounds.  Abdominal:     Palpations: Abdomen is soft.     Tenderness: There is no abdominal tenderness.  Neurological:     General: No focal deficit present.     Mental Status: She is alert and oriented to person, place, and time.         Assessment & Plan:   Essential hypertension  Prediabetes  Allergic rhinitis, unspecified seasonality, unspecified trigger  Class 3 severe obesity due to excess calories with serious comorbidity and body mass index (BMI) of  50.0 to 59.9 in adult  Vitamin D  deficiency -     VITAMIN D  25 Hydroxy (Vit-D Deficiency, Fractures)  Other orders -     Carvedilol ; Take 1 tablet (12.5 mg total) by mouth 2 (two) times daily with a meal.  Dispense: 180 tablet; Refill: 1     No follow-ups on file.   Arlo Lama, MD

## 2024-02-10 LAB — VITAMIN D 25 HYDROXY (VIT D DEFICIENCY, FRACTURES): Vit D, 25-Hydroxy: 27.1 ng/mL — ABNORMAL LOW (ref 30.0–100.0)

## 2024-02-12 ENCOUNTER — Ambulatory Visit: Payer: Self-pay | Admitting: Family

## 2024-02-12 ENCOUNTER — Other Ambulatory Visit: Payer: Self-pay

## 2024-02-12 DIAGNOSIS — E559 Vitamin D deficiency, unspecified: Secondary | ICD-10-CM

## 2024-02-12 MED ORDER — VITAMIN D (ERGOCALCIFEROL) 1.25 MG (50000 UNIT) PO CAPS
50000.0000 [IU] | ORAL_CAPSULE | ORAL | 0 refills | Status: DC
  Filled 2024-02-12: qty 12, 84d supply, fill #0

## 2024-02-21 ENCOUNTER — Other Ambulatory Visit: Payer: Self-pay

## 2024-02-22 ENCOUNTER — Other Ambulatory Visit: Payer: Self-pay

## 2024-02-22 MED ORDER — VITAMIN D (ERGOCALCIFEROL) 1.25 MG (50000 UNIT) PO CAPS
50000.0000 [IU] | ORAL_CAPSULE | ORAL | 0 refills | Status: AC
  Filled 2024-02-22 – 2024-08-04 (×3): qty 12, 84d supply, fill #0

## 2024-02-23 ENCOUNTER — Other Ambulatory Visit: Payer: Self-pay | Admitting: Family Medicine

## 2024-03-04 ENCOUNTER — Other Ambulatory Visit: Payer: Self-pay

## 2024-03-14 ENCOUNTER — Other Ambulatory Visit: Payer: Self-pay

## 2024-03-14 MED ORDER — GABAPENTIN 300 MG PO CAPS
300.0000 mg | ORAL_CAPSULE | Freq: Three times a day (TID) | ORAL | 3 refills | Status: AC
  Filled 2024-03-14 – 2024-04-29 (×2): qty 90, 30d supply, fill #0

## 2024-03-20 ENCOUNTER — Other Ambulatory Visit: Payer: Self-pay

## 2024-03-27 ENCOUNTER — Other Ambulatory Visit: Payer: Self-pay

## 2024-04-29 ENCOUNTER — Other Ambulatory Visit: Payer: Self-pay

## 2024-04-30 ENCOUNTER — Other Ambulatory Visit: Payer: Self-pay

## 2024-05-02 ENCOUNTER — Other Ambulatory Visit: Payer: Self-pay

## 2024-05-08 ENCOUNTER — Other Ambulatory Visit: Payer: Self-pay

## 2024-05-14 ENCOUNTER — Ambulatory Visit: Admitting: Family Medicine

## 2024-05-14 ENCOUNTER — Encounter: Payer: Self-pay | Admitting: Family Medicine

## 2024-05-14 VITALS — BP 178/85 | HR 71 | Ht 63.0 in | Wt 321.0 lb

## 2024-05-14 DIAGNOSIS — Z6841 Body Mass Index (BMI) 40.0 and over, adult: Secondary | ICD-10-CM

## 2024-05-14 DIAGNOSIS — E66813 Obesity, class 3: Secondary | ICD-10-CM

## 2024-05-14 DIAGNOSIS — E785 Hyperlipidemia, unspecified: Secondary | ICD-10-CM | POA: Diagnosis not present

## 2024-05-14 DIAGNOSIS — I10 Essential (primary) hypertension: Secondary | ICD-10-CM | POA: Diagnosis not present

## 2024-05-15 ENCOUNTER — Ambulatory Visit: Payer: Self-pay | Admitting: Family Medicine

## 2024-05-15 ENCOUNTER — Encounter: Payer: Self-pay | Admitting: Family Medicine

## 2024-05-15 LAB — LIPID PANEL
Chol/HDL Ratio: 2.3 ratio (ref 0.0–4.4)
Cholesterol, Total: 144 mg/dL (ref 100–199)
HDL: 62 mg/dL (ref >39)
LDL Chol Calc (NIH): 73 mg/dL (ref 0–99)
Triglycerides: 40 mg/dL (ref 0–149)
VLDL Cholesterol Cal: 9 mg/dL (ref 5–40)

## 2024-05-15 NOTE — Progress Notes (Signed)
 Established Patient Office Visit  Subjective    Patient ID: Carolyn Mcdonald, female    DOB: 02-Nov-1965  Age: 58 y.o. MRN: [id number]  CC:  Chief Complaint  Patient presents with   Medical Management of Chronic Issues    Pt states she is here to test ger cholesterol      HPI Carolyn Mcdonald presents for follow up of hyperlipidemia and hypertension. Patient reports med compliance and denies acute complaints.   Outpatient Encounter Medications as of 05/14/2024  Medication Sig   carvedilol  (COREG ) 12.5 MG tablet Take 1 tablet (12.5 mg total) by mouth 2 (two) times daily with a meal.   fluticasone  (FLONASE ) 50 MCG/ACT nasal spray Place 1 spray into both nostrils 2 (two) times daily as needed for rhinitis.   gabapentin  (NEURONTIN ) 300 MG capsule Take 1 capsule (300 mg total) by mouth 3 (three) times daily.   Multiple Vitamin (MULTIVITAMIN) tablet Take 1 tablet by mouth daily.   rivaroxaban  (XARELTO ) 20 MG TABS tablet TAKE 1 TABLET (20 MG TOTAL) BY MOUTH DAILY WITH SUPPER.   rosuvastatin  (CRESTOR ) 20 MG tablet Take 1 tablet (20 mg total) by mouth daily.   spironolactone  (ALDACTONE ) 50 MG tablet Take 1 tablet (50 mg total) by mouth daily.   valsartan  (DIOVAN ) 160 MG tablet Take 1 tablet (160 mg total) by mouth daily.   Vitamin D , Ergocalciferol , (DRISDOL ) 1.25 MG (50000 UNIT) CAPS capsule Take 1 capsule (50,000 Units total) by mouth every 7 (seven) days.   acetaminophen  (TYLENOL ) 650 MG CR tablet Take 650 mg by mouth every 8 (eight) hours as needed for pain. (Patient not taking: Reported on 05/14/2024)   No facility-administered encounter medications on file as of 05/14/2024.    Past Medical History:  Diagnosis Date   Class 3 severe obesity due to excess calories with serious comorbidity and body mass index (BMI) of 50.0 to 59.9 in adult 01/30/2020   DVT (deep venous thrombosis) (HCC)    Fibroid    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   Hypertension    Obstructive sleep apnea  12/23/2020   Prediabetes 01/31/2020   S/P abdominal hysterectomy 07/21/2020   Vitamin D  deficiency 01/31/2020    Past Surgical History:  Procedure Laterality Date   ANKLE SURGERY     R ankle    CESAREAN SECTION     x3   HYSTERECTOMY ABDOMINAL WITH SALPINGECTOMY Bilateral 07/21/2020   Procedure: SUPRACERVICAL HYSTERECTOMY  WITH BILATERAL PARTIAL SALPINGECTOMY;  Surgeon: Alger Gong, MD;  Location: [facility] OR;  Service: Gynecology;  Laterality: Bilateral;  tap block   SUPRACERVICAL ABDOMINAL HYSTERECTOMY  07/21/2020   TOE SURGERY     R Great toe    TUBAL LIGATION  1993    History reviewed. No pertinent family history.  Social History   Socioeconomic History   Marital status: Married    Spouse name: Not on file   Number of children: Not on file   Years of education: Not on file   Highest education level: Not on file  Occupational History   Not on file  Tobacco Use   Smoking status: Never   Smokeless tobacco: Never  Vaping Use   Vaping status: Never Used  Substance and Sexual Activity   Alcohol use: Never   Drug use: Never   Sexual activity: Not Currently    Birth control/protection: Surgical  Other Topics Concern   Not on file  Social History Narrative   Not on file   Social Drivers of Health  Financial Resource Strain: Low Risk  (02/02/2023)   Overall Financial Resource Strain (CARDIA)    Difficulty of Paying Living Expenses: Not very hard  Food Insecurity: No Food Insecurity (02/02/2023)   Hunger Vital Sign    Worried About Running Out of Food in the Last Year: Never true    Ran Out of Food in the Last Year: Never true  Transportation Needs: No Transportation Needs (02/02/2023)   PRAPARE - Administrator, Civil Service (Medical): No    Lack of Transportation (Non-Medical): No  Physical Activity: Inactive (02/02/2023)   Exercise Vital Sign    Days of Exercise per Week: 0 days    Minutes of Exercise per Session: 0 min  Stress: No Stress Concern  Present (02/02/2023)   [employer] of Occupational Health - Occupational Stress Questionnaire    Feeling of Stress : Not at all  Social Connections: Socially Integrated (02/02/2023)   Social Connection and Isolation Panel    Frequency of Communication with Friends and Family: More than three times a week    Frequency of Social Gatherings with Friends and Family: Three times a week    Attends Religious Services: More than 4 times per year    Active Member of Clubs or Organizations: Yes    Attends Banker Meetings: Never    Marital Status: Married  Catering manager Violence: Not At Risk (02/02/2023)   Humiliation, Afraid, Rape, and Kick questionnaire    Fear of Current or Ex-Partner: No    Emotionally Abused: No    Physically Abused: No    Sexually Abused: No    Review of Systems  All other systems reviewed and are negative.       Objective    BP (!) 178/85   Pulse 71   Ht 5' 3 (1.6 m)   Wt (!) 321 lb (145.6 kg)   LMP 05/13/2020   SpO2 98%   BMI 56.86 kg/m   Physical Exam Vitals and nursing note reviewed.  Constitutional:      General: She is not in acute distress.    Appearance: She is obese.  Cardiovascular:     Rate and Rhythm: Normal rate and regular rhythm.  Pulmonary:     Effort: Pulmonary effort is normal.     Breath sounds: Normal breath sounds.  Abdominal:     Palpations: Abdomen is soft.     Tenderness: There is no abdominal tenderness.  Neurological:     General: No focal deficit present.     Mental Status: She is alert and oriented to person, place, and time.         Assessment & Plan:   1. Hyperlipidemia, unspecified hyperlipidemia type (Primary)  - Lipid Panel  2. Uncontrolled hypertension Will consider increasing agent doses pending follow up as patient is reluctant at this time.   3. Class 3 severe obesity due to excess calories with serious comorbidity and body mass index (BMI) of 50.0 to 59.9 in adult     Return  in about 2 weeks (around 05/28/2024) for follow up.   Tanda Raguel SQUIBB, MD

## 2024-05-25 ENCOUNTER — Other Ambulatory Visit: Payer: Self-pay

## 2024-05-27 ENCOUNTER — Other Ambulatory Visit: Payer: Self-pay

## 2024-07-23 ENCOUNTER — Other Ambulatory Visit: Payer: Self-pay | Admitting: Family Medicine

## 2024-07-23 DIAGNOSIS — Z1231 Encounter for screening mammogram for malignant neoplasm of breast: Secondary | ICD-10-CM

## 2024-08-04 ENCOUNTER — Other Ambulatory Visit: Payer: Self-pay | Admitting: Family Medicine

## 2024-08-04 DIAGNOSIS — Z86718 Personal history of other venous thrombosis and embolism: Secondary | ICD-10-CM

## 2024-08-05 ENCOUNTER — Other Ambulatory Visit: Payer: Self-pay

## 2024-08-07 ENCOUNTER — Other Ambulatory Visit: Payer: Self-pay

## 2024-08-07 ENCOUNTER — Other Ambulatory Visit: Payer: Self-pay | Admitting: Family Medicine

## 2024-08-07 MED ORDER — RIVAROXABAN 20 MG PO TABS
20.0000 mg | ORAL_TABLET | Freq: Every day | ORAL | 1 refills | Status: AC
  Filled 2024-08-07 – 2024-08-20 (×2): qty 90, 90d supply, fill #0

## 2024-08-07 NOTE — Telephone Encounter (Unsigned)
 Copied from CRM #[id number]. Topic: Clinical - Medication Refill >> Aug 07, 2024  1:10 PM Kevelyn M wrote: Medication: fluticasone  (FLONASE ) 50 MCG/ACT nasal spray, valsartan  (DIOVAN ) 160 MG tablet, spironolactone  (ALDACTONE ) 50 MG tablet  Has the patient contacted their pharmacy? Yes (Agent: If no, request that the patient contact the pharmacy for the refill. If patient does not wish to contact the pharmacy document the reason why and proceed with request.) (Agent: If yes, when and what did the pharmacy advise?)  This is the patient's preferred pharmacy:  [facility] [address] [place] [address] Phone: [phone] Fax: [phone]  Is this the correct pharmacy for this prescription? Yes If no, delete pharmacy and type the correct one.   Has the prescription been filled recently? No  Is the patient out of the medication? Yes  Has the patient been seen for an appointment in the last year OR does the patient have an upcoming appointment? Yes  Can we respond through MyChart? Yes  Agent: Please be advised that Rx refills may take up to 3 business days. We ask that you follow-up with your pharmacy.

## 2024-08-13 ENCOUNTER — Other Ambulatory Visit: Payer: Self-pay

## 2024-08-13 MED ORDER — SPIRONOLACTONE 50 MG PO TABS
50.0000 mg | ORAL_TABLET | Freq: Every day | ORAL | 0 refills | Status: AC
  Filled 2024-08-13: qty 90, 90d supply, fill #0

## 2024-08-13 MED ORDER — VALSARTAN 160 MG PO TABS
160.0000 mg | ORAL_TABLET | Freq: Every day | ORAL | 0 refills | Status: AC
  Filled 2024-08-13: qty 90, 90d supply, fill #0

## 2024-08-13 NOTE — Telephone Encounter (Signed)
 Requested medication (s) are due for refill today: routing for review  Requested medication (s) are on the active medication list: yes  Last refill:  01/05/24  Future visit scheduled: yes  Notes to clinic:  Unable to refill per protocol, last refill by another provider. Last refilled by [facility] provider.     Requested Prescriptions  Pending Prescriptions Disp Refills   fluticasone  (FLONASE ) 50 MCG/ACT nasal spray 16 g 0    Sig: Place 1 spray into both nostrils 2 (two) times daily as needed for rhinitis.     Ear, Nose, and Throat: Nasal Preparations - Corticosteroids Passed - 08/13/2024  9:11 AM      Passed - Valid encounter within last 12 months    Recent Outpatient Visits           3 months ago Hyperlipidemia, unspecified hyperlipidemia type   [place] [facility], MD   6 months ago Essential hypertension   [place] [facility], MD   8 months ago Essential hypertension   [place] [facility], MD   1 year ago Annual physical exam   [place] [facility], MD   1 year ago Essential hypertension   [place] [facility], MD              Signed Prescriptions Disp Refills   spironolactone  (ALDACTONE ) 50 MG tablet 90 tablet 0    Sig: Take 1 tablet (50 mg total) by mouth daily.     Cardiovascular: Diuretics - Aldosterone Antagonist Failed - 08/13/2024  9:11 AM      Failed - Cr in normal range and within 180 days    Creatinine, Ser  Date Value Ref Range Status  12/27/2023 0.70 0.57 - 1.00 mg/dL Final         Failed - K in normal range and within 180 days    Potassium  Date Value Ref Range Status  12/27/2023 4.4 3.5 - 5.2 mmol/L Final         Failed - Na in normal range and within 180 days    Sodium  Date Value Ref Range Status  12/27/2023 142 134 - 144 mmol/L Final         Failed - eGFR is  30 or above and within 180 days    GFR calc Af Amer  Date Value Ref Range Status  04/27/2020 102 >59 mL/min/1.73 Final    Comment:    **[facility] currently reports eGFR in compliance with the current**   recommendations of the [employer]. [facility] will   update reporting as new guidelines are published from the NKF-ASN   Task force.    GFR, Estimated  Date Value Ref Range Status  10/31/2023 >60 >60 mL/min Final    Comment:    (NOTE) Calculated using the CKD-EPI Creatinine Equation (2021)    eGFR  Date Value Ref Range Status  12/27/2023 100 >59 mL/min/1.73 Final         Failed - Last BP in normal range    BP Readings from Last 1 Encounters:  05/14/24 (!) 178/85         Passed - Valid encounter within last 6 months    Recent Outpatient Visits           3 months ago Hyperlipidemia, unspecified hyperlipidemia type   [place] [facility], MD   6 months ago Essential hypertension   [place] [facility], MD   8 months ago Essential hypertension   [place] [facility], MD   1 year ago Annual physical exam   [place] [facility], MD   1 year ago Essential hypertension   [place] [facility], Bleacher, MD               valsartan  (DIOVAN ) 160 MG tablet 90 tablet 0    Sig: Take 1 tablet (160 mg total) by mouth daily.     Cardiovascular:  Angiotensin Receptor Blockers Failed - 08/13/2024  9:11 AM      Failed - Cr in normal range and within 180 days    Creatinine, Ser  Date Value Ref Range Status  12/27/2023 0.70 0.57 - 1.00 mg/dL Final         Failed - K in normal range and within 180 days    Potassium  Date Value Ref Range Status  12/27/2023 4.4 3.5 - 5.2 mmol/L Final         Failed - Last BP in normal range    BP Readings from Last 1 Encounters:  05/14/24 (!) 178/85          Passed - Patient is not pregnant      Passed - Valid encounter within last 6 months    Recent Outpatient Visits           3 months ago Hyperlipidemia, unspecified hyperlipidemia type   [place] [facility], MD   6 months ago Essential hypertension   [place] [facility], MD   8 months ago Essential hypertension   [place] [facility], MD   1 year ago Annual physical exam   [place] [facility], MD   1 year ago Essential hypertension   [place] [facility], MD

## 2024-08-13 NOTE — Telephone Encounter (Signed)
 Requested Prescriptions  Pending Prescriptions Disp Refills   fluticasone  (FLONASE ) 50 MCG/ACT nasal spray 16 g 0    Sig: Place 1 spray into both nostrils 2 (two) times daily as needed for rhinitis.     Ear, Nose, and Throat: Nasal Preparations - Corticosteroids Passed - 08/13/2024  9:09 AM      Passed - Valid encounter within last 12 months    Recent Outpatient Visits           3 months ago Hyperlipidemia, unspecified hyperlipidemia type   [place] [facility], MD   6 months ago Essential hypertension   [place] [facility], MD   8 months ago Essential hypertension   [place] [facility], MD   1 year ago Annual physical exam   [place] [facility], MD   1 year ago Essential hypertension   [place] [facility], Raguel, MD               spironolactone  (ALDACTONE ) 50 MG tablet 90 tablet 0    Sig: Take 1 tablet (50 mg total) by mouth daily.     Cardiovascular: Diuretics - Aldosterone Antagonist Failed - 08/13/2024  9:09 AM      Failed - Cr in normal range and within 180 days    Creatinine, Ser  Date Value Ref Range Status  12/27/2023 0.70 0.57 - 1.00 mg/dL Final         Failed - K in normal range and within 180 days    Potassium  Date Value Ref Range Status  12/27/2023 4.4 3.5 - 5.2 mmol/L Final         Failed - Na in normal range and within 180 days    Sodium  Date Value Ref Range Status  12/27/2023 142 134 - 144 mmol/L Final         Failed - eGFR is 30 or above and within 180 days    GFR calc Af Amer  Date Value Ref Range Status  04/27/2020 102 >59 mL/min/1.73 Final    Comment:    **[facility] currently reports eGFR in compliance with the current**   recommendations of the [employer]. [facility] will   update reporting as new guidelines are published from the NKF-ASN   Task  force.    GFR, Estimated  Date Value Ref Range Status  10/31/2023 >60 >60 mL/min Final    Comment:    (NOTE) Calculated using the CKD-EPI Creatinine Equation (2021)    eGFR  Date Value Ref Range Status  12/27/2023 100 >59 mL/min/1.73 Final         Failed - Last BP in normal range    BP Readings from Last 1 Encounters:  05/14/24 (!) 178/85         Passed - Valid encounter within last 6 months    Recent Outpatient Visits           3 months ago Hyperlipidemia, unspecified hyperlipidemia type   [place] [facility], MD   6 months ago Essential hypertension   [place] [facility] [place], [place]   8 months ago Essential hypertension   [place] [facility], MD   1 year ago Annual physical exam   [facility] [place],  MD   1 year ago Essential hypertension   [place] [facility], Raguel, MD               valsartan  (DIOVAN ) 160 MG tablet 90 tablet 0    Sig: Take 1 tablet (160 mg total) by mouth daily.     Cardiovascular:  Angiotensin Receptor Blockers Failed - 08/13/2024  9:09 AM      Failed - Cr in normal range and within 180 days    Creatinine, Ser  Date Value Ref Range Status  12/27/2023 0.70 0.57 - 1.00 mg/dL Final         Failed - K in normal range and within 180 days    Potassium  Date Value Ref Range Status  12/27/2023 4.4 3.5 - 5.2 mmol/L Final         Failed - Last BP in normal range    BP Readings from Last 1 Encounters:  05/14/24 (!) 178/85         Passed - Patient is not pregnant      Passed - Valid encounter within last 6 months    Recent Outpatient Visits           3 months ago Hyperlipidemia, unspecified hyperlipidemia type   [place] [facility], MD   6 months ago Essential hypertension   [place] [facility], MD   8 months ago Essential hypertension   [place] [facility], MD   1 year ago Annual physical exam   [place] [facility], MD   1 year ago Essential hypertension   [place] [facility], MD

## 2024-08-15 ENCOUNTER — Other Ambulatory Visit: Payer: Self-pay

## 2024-08-16 ENCOUNTER — Other Ambulatory Visit: Payer: Self-pay

## 2024-08-20 ENCOUNTER — Other Ambulatory Visit: Payer: Self-pay

## 2024-08-21 ENCOUNTER — Other Ambulatory Visit: Payer: Self-pay

## 2024-08-21 ENCOUNTER — Ambulatory Visit
Admission: RE | Admit: 2024-08-21 | Discharge: 2024-08-21 | Disposition: A | Discharge status: Home / Self Care | Source: Ambulatory Visit | Attending: Family Medicine | Admitting: Family Medicine

## 2024-08-21 DIAGNOSIS — Z1231 Encounter for screening mammogram for malignant neoplasm of breast: Secondary | ICD-10-CM

## 2024-08-28 ENCOUNTER — Other Ambulatory Visit: Payer: Self-pay | Admitting: Family Medicine

## 2024-08-28 DIAGNOSIS — N644 Mastodynia: Secondary | ICD-10-CM

## 2024-09-02 ENCOUNTER — Other Ambulatory Visit: Payer: Self-pay

## 2024-09-06 ENCOUNTER — Other Ambulatory Visit: Payer: Self-pay

## 2024-10-08 ENCOUNTER — Encounter

## 2024-10-08 ENCOUNTER — Inpatient Hospital Stay: Admission: RE | Admit: 2024-10-08 | Source: Ambulatory Visit
